# Patient Record
Sex: Male | Born: 1945 | Race: Black or African American | Hispanic: No | Marital: Single | Smoking: Former smoker
Health system: Southern US, Community
[De-identification: ages and names within clinical notes are randomized; demographics above are authoritative.]

## PROBLEM LIST (undated history)

## (undated) DIAGNOSIS — J189 Pneumonia, unspecified organism: Secondary | ICD-10-CM

## (undated) DIAGNOSIS — A044 Other intestinal Escherichia coli infections: Secondary | ICD-10-CM

## (undated) DIAGNOSIS — K5900 Constipation, unspecified: Secondary | ICD-10-CM

## (undated) DIAGNOSIS — R569 Unspecified convulsions: Secondary | ICD-10-CM

## (undated) DIAGNOSIS — I10 Essential (primary) hypertension: Secondary | ICD-10-CM

## (undated) DIAGNOSIS — L98499 Non-pressure chronic ulcer of skin of other sites with unspecified severity: Secondary | ICD-10-CM

## (undated) DIAGNOSIS — N390 Urinary tract infection, site not specified: Secondary | ICD-10-CM

## (undated) DIAGNOSIS — F3289 Other specified depressive episodes: Secondary | ICD-10-CM

## (undated) DIAGNOSIS — I6789 Other cerebrovascular disease: Secondary | ICD-10-CM

## (undated) DIAGNOSIS — Z79899 Other long term (current) drug therapy: Secondary | ICD-10-CM

## (undated) DIAGNOSIS — N51 Disorders of male genital organs in diseases classified elsewhere: Secondary | ICD-10-CM

## (undated) DIAGNOSIS — F29 Unspecified psychosis not due to a substance or known physiological condition: Secondary | ICD-10-CM

## (undated) DIAGNOSIS — N400 Enlarged prostate without lower urinary tract symptoms: Secondary | ICD-10-CM

## (undated) DIAGNOSIS — J069 Acute upper respiratory infection, unspecified: Secondary | ICD-10-CM

## (undated) DIAGNOSIS — J811 Chronic pulmonary edema: Secondary | ICD-10-CM

## (undated) DIAGNOSIS — M869 Osteomyelitis, unspecified: Secondary | ICD-10-CM

## (undated) DIAGNOSIS — K56609 Unspecified intestinal obstruction, unspecified as to partial versus complete obstruction: Secondary | ICD-10-CM

## (undated) DIAGNOSIS — D696 Thrombocytopenia, unspecified: Secondary | ICD-10-CM

## (undated) DIAGNOSIS — G8929 Other chronic pain: Secondary | ICD-10-CM

## (undated) DIAGNOSIS — R188 Other ascites: Secondary | ICD-10-CM

## (undated) DIAGNOSIS — N4889 Other specified disorders of penis: Secondary | ICD-10-CM

## (undated) DIAGNOSIS — F329 Major depressive disorder, single episode, unspecified: Secondary | ICD-10-CM

## (undated) DIAGNOSIS — N179 Acute kidney failure, unspecified: Secondary | ICD-10-CM

## (undated) DIAGNOSIS — S065X9A Traumatic subdural hemorrhage with loss of consciousness of unspecified duration, initial encounter: Secondary | ICD-10-CM

## (undated) HISTORY — PX: BELOW KNEE LEG AMPUTATION: SUR23

## (undated) HISTORY — DX: Pneumonia, unspecified organism: J18.9

## (undated) HISTORY — DX: Traumatic subdural hemorrhage with loss of consciousness of unspecified duration, initial encounter: S06.5X9A

---

## 1988-07-02 HISTORY — PX: OTHER SURGICAL HISTORY: SHX169

## 1997-10-07 ENCOUNTER — Ambulatory Visit (HOSPITAL_COMMUNITY): Admission: RE | Admit: 1997-10-07 | Discharge: 1997-10-07 | Payer: Self-pay | Admitting: Neurological Surgery

## 1997-11-03 ENCOUNTER — Encounter: Admission: RE | Admit: 1997-11-03 | Discharge: 1998-02-01 | Payer: Self-pay | Admitting: Neurological Surgery

## 1999-04-18 ENCOUNTER — Emergency Department (HOSPITAL_COMMUNITY): Admission: EM | Admit: 1999-04-18 | Discharge: 1999-04-18 | Payer: Self-pay | Admitting: Emergency Medicine

## 1999-04-18 ENCOUNTER — Encounter: Payer: Self-pay | Admitting: Emergency Medicine

## 1999-06-14 ENCOUNTER — Encounter: Payer: Self-pay | Admitting: Emergency Medicine

## 1999-06-14 ENCOUNTER — Inpatient Hospital Stay (HOSPITAL_COMMUNITY): Admission: EM | Admit: 1999-06-14 | Discharge: 1999-06-15 | Payer: Self-pay | Admitting: Emergency Medicine

## 1999-07-05 ENCOUNTER — Encounter: Payer: Self-pay | Admitting: Family Medicine

## 1999-07-05 ENCOUNTER — Ambulatory Visit (HOSPITAL_COMMUNITY): Admission: RE | Admit: 1999-07-05 | Discharge: 1999-07-05 | Payer: Self-pay | Admitting: Family Medicine

## 2000-05-14 ENCOUNTER — Emergency Department (HOSPITAL_COMMUNITY): Admission: EM | Admit: 2000-05-14 | Discharge: 2000-05-15 | Payer: Self-pay | Admitting: Emergency Medicine

## 2000-07-12 ENCOUNTER — Encounter: Admission: RE | Admit: 2000-07-12 | Discharge: 2000-07-12 | Payer: Self-pay | Admitting: Family Medicine

## 2000-07-19 ENCOUNTER — Encounter: Admission: RE | Admit: 2000-07-19 | Discharge: 2000-07-19 | Payer: Self-pay | Admitting: Family Medicine

## 2000-07-22 ENCOUNTER — Encounter: Payer: Self-pay | Admitting: Emergency Medicine

## 2000-07-22 ENCOUNTER — Emergency Department (HOSPITAL_COMMUNITY): Admission: EM | Admit: 2000-07-22 | Discharge: 2000-07-22 | Payer: Self-pay | Admitting: Emergency Medicine

## 2000-08-05 ENCOUNTER — Encounter: Admission: RE | Admit: 2000-08-05 | Discharge: 2000-08-05 | Payer: Self-pay | Admitting: Family Medicine

## 2000-08-15 ENCOUNTER — Emergency Department (HOSPITAL_COMMUNITY): Admission: EM | Admit: 2000-08-15 | Discharge: 2000-08-15 | Payer: Self-pay | Admitting: *Deleted

## 2000-08-29 ENCOUNTER — Encounter: Admission: RE | Admit: 2000-08-29 | Discharge: 2000-08-29 | Payer: Self-pay | Admitting: Family Medicine

## 2000-09-11 ENCOUNTER — Emergency Department (HOSPITAL_COMMUNITY): Admission: EM | Admit: 2000-09-11 | Discharge: 2000-09-11 | Payer: Self-pay | Admitting: Emergency Medicine

## 2000-09-25 ENCOUNTER — Encounter: Admission: RE | Admit: 2000-09-25 | Discharge: 2000-09-25 | Payer: Self-pay | Admitting: Family Medicine

## 2000-10-14 ENCOUNTER — Encounter (INDEPENDENT_AMBULATORY_CARE_PROVIDER_SITE_OTHER): Payer: Self-pay

## 2000-10-14 ENCOUNTER — Ambulatory Visit (HOSPITAL_COMMUNITY): Admission: RE | Admit: 2000-10-14 | Discharge: 2000-10-14 | Payer: Self-pay | Admitting: Gastroenterology

## 2000-10-29 ENCOUNTER — Emergency Department (HOSPITAL_COMMUNITY): Admission: EM | Admit: 2000-10-29 | Discharge: 2000-10-29 | Payer: Self-pay | Admitting: Emergency Medicine

## 2000-11-05 ENCOUNTER — Encounter: Admission: RE | Admit: 2000-11-05 | Discharge: 2000-11-05 | Payer: Self-pay | Admitting: Family Medicine

## 2000-12-17 ENCOUNTER — Emergency Department (HOSPITAL_COMMUNITY): Admission: EM | Admit: 2000-12-17 | Discharge: 2000-12-18 | Payer: Self-pay | Admitting: Emergency Medicine

## 2001-01-29 ENCOUNTER — Encounter: Admission: RE | Admit: 2001-01-29 | Discharge: 2001-01-29 | Payer: Self-pay | Admitting: Family Medicine

## 2001-02-13 ENCOUNTER — Encounter: Admission: RE | Admit: 2001-02-13 | Discharge: 2001-02-13 | Payer: Self-pay | Admitting: Family Medicine

## 2001-02-27 ENCOUNTER — Encounter: Admission: RE | Admit: 2001-02-27 | Discharge: 2001-02-27 | Payer: Self-pay | Admitting: Family Medicine

## 2001-03-26 ENCOUNTER — Encounter: Admission: RE | Admit: 2001-03-26 | Discharge: 2001-03-26 | Payer: Self-pay | Admitting: Family Medicine

## 2001-05-27 ENCOUNTER — Encounter: Admission: RE | Admit: 2001-05-27 | Discharge: 2001-05-27 | Payer: Self-pay | Admitting: Family Medicine

## 2001-06-11 ENCOUNTER — Encounter: Admission: RE | Admit: 2001-06-11 | Discharge: 2001-06-11 | Payer: Self-pay | Admitting: *Deleted

## 2001-06-26 ENCOUNTER — Encounter: Admission: RE | Admit: 2001-06-26 | Discharge: 2001-06-26 | Payer: Self-pay | Admitting: Family Medicine

## 2001-08-01 ENCOUNTER — Encounter: Admission: RE | Admit: 2001-08-01 | Discharge: 2001-08-01 | Payer: Self-pay | Admitting: Family Medicine

## 2001-09-01 ENCOUNTER — Encounter: Admission: RE | Admit: 2001-09-01 | Discharge: 2001-09-01 | Payer: Self-pay | Admitting: Family Medicine

## 2001-09-11 ENCOUNTER — Encounter: Admission: RE | Admit: 2001-09-11 | Discharge: 2001-09-11 | Payer: Self-pay | Admitting: Family Medicine

## 2001-10-23 ENCOUNTER — Encounter: Admission: RE | Admit: 2001-10-23 | Discharge: 2001-10-23 | Payer: Self-pay | Admitting: Family Medicine

## 2001-12-03 ENCOUNTER — Encounter: Admission: RE | Admit: 2001-12-03 | Discharge: 2001-12-03 | Payer: Self-pay | Admitting: Family Medicine

## 2002-01-15 ENCOUNTER — Encounter: Admission: RE | Admit: 2002-01-15 | Discharge: 2002-01-15 | Payer: Self-pay | Admitting: Family Medicine

## 2002-02-13 ENCOUNTER — Encounter: Admission: RE | Admit: 2002-02-13 | Discharge: 2002-03-26 | Payer: Self-pay | Admitting: Sports Medicine

## 2002-02-16 ENCOUNTER — Encounter: Admission: RE | Admit: 2002-02-16 | Discharge: 2002-02-16 | Payer: Self-pay | Admitting: Family Medicine

## 2002-02-24 ENCOUNTER — Encounter: Admission: RE | Admit: 2002-02-24 | Discharge: 2002-02-24 | Payer: Self-pay | Admitting: Sports Medicine

## 2002-04-23 ENCOUNTER — Encounter: Admission: RE | Admit: 2002-04-23 | Discharge: 2002-04-23 | Payer: Self-pay | Admitting: Family Medicine

## 2002-05-27 ENCOUNTER — Encounter: Admission: RE | Admit: 2002-05-27 | Discharge: 2002-05-27 | Payer: Self-pay | Admitting: Family Medicine

## 2002-07-09 ENCOUNTER — Encounter: Admission: RE | Admit: 2002-07-09 | Discharge: 2002-07-09 | Payer: Self-pay | Admitting: Family Medicine

## 2002-08-25 ENCOUNTER — Encounter: Admission: RE | Admit: 2002-08-25 | Discharge: 2002-08-25 | Payer: Self-pay | Admitting: Family Medicine

## 2002-10-15 ENCOUNTER — Encounter: Admission: RE | Admit: 2002-10-15 | Discharge: 2002-10-15 | Payer: Self-pay | Admitting: Family Medicine

## 2002-11-16 ENCOUNTER — Encounter: Admission: RE | Admit: 2002-11-16 | Discharge: 2002-11-16 | Payer: Self-pay | Admitting: Family Medicine

## 2002-12-04 ENCOUNTER — Emergency Department (HOSPITAL_COMMUNITY): Admission: EM | Admit: 2002-12-04 | Discharge: 2002-12-04 | Payer: Self-pay | Admitting: Emergency Medicine

## 2002-12-22 ENCOUNTER — Encounter: Payer: Self-pay | Admitting: Emergency Medicine

## 2002-12-22 ENCOUNTER — Emergency Department (HOSPITAL_COMMUNITY): Admission: EM | Admit: 2002-12-22 | Discharge: 2002-12-22 | Payer: Self-pay | Admitting: Emergency Medicine

## 2002-12-23 ENCOUNTER — Encounter: Admission: RE | Admit: 2002-12-23 | Discharge: 2002-12-23 | Payer: Self-pay | Admitting: Family Medicine

## 2003-02-02 ENCOUNTER — Encounter: Admission: RE | Admit: 2003-02-02 | Discharge: 2003-02-02 | Payer: Self-pay | Admitting: Family Medicine

## 2003-03-31 ENCOUNTER — Encounter: Admission: RE | Admit: 2003-03-31 | Discharge: 2003-03-31 | Payer: Self-pay | Admitting: Family Medicine

## 2003-06-16 ENCOUNTER — Encounter: Admission: RE | Admit: 2003-06-16 | Discharge: 2003-06-16 | Payer: Self-pay | Admitting: Family Medicine

## 2003-07-13 ENCOUNTER — Encounter: Admission: RE | Admit: 2003-07-13 | Discharge: 2003-07-13 | Payer: Self-pay | Admitting: Sports Medicine

## 2003-09-13 ENCOUNTER — Encounter: Admission: RE | Admit: 2003-09-13 | Discharge: 2003-09-13 | Payer: Self-pay | Admitting: Family Medicine

## 2003-09-30 ENCOUNTER — Encounter: Admission: RE | Admit: 2003-09-30 | Discharge: 2003-09-30 | Payer: Self-pay | Admitting: Sports Medicine

## 2003-10-14 ENCOUNTER — Encounter: Admission: RE | Admit: 2003-10-14 | Discharge: 2003-10-14 | Payer: Self-pay | Admitting: Family Medicine

## 2003-12-02 ENCOUNTER — Encounter: Admission: RE | Admit: 2003-12-02 | Discharge: 2003-12-02 | Payer: Self-pay | Admitting: Family Medicine

## 2004-02-07 ENCOUNTER — Encounter: Admission: RE | Admit: 2004-02-07 | Discharge: 2004-02-07 | Payer: Self-pay | Admitting: Family Medicine

## 2004-03-10 ENCOUNTER — Emergency Department (HOSPITAL_COMMUNITY): Admission: EM | Admit: 2004-03-10 | Discharge: 2004-03-10 | Payer: Self-pay | Admitting: Emergency Medicine

## 2004-03-17 ENCOUNTER — Ambulatory Visit: Payer: Self-pay | Admitting: Family Medicine

## 2004-03-17 ENCOUNTER — Ambulatory Visit (HOSPITAL_COMMUNITY): Admission: RE | Admit: 2004-03-17 | Discharge: 2004-03-17 | Payer: Self-pay | Admitting: Family Medicine

## 2004-03-20 ENCOUNTER — Ambulatory Visit: Payer: Self-pay | Admitting: Family Medicine

## 2004-03-27 ENCOUNTER — Ambulatory Visit: Payer: Self-pay | Admitting: Family Medicine

## 2004-05-15 ENCOUNTER — Ambulatory Visit: Payer: Self-pay | Admitting: Family Medicine

## 2004-06-16 ENCOUNTER — Ambulatory Visit: Payer: Self-pay | Admitting: Sports Medicine

## 2004-06-19 ENCOUNTER — Encounter: Admission: RE | Admit: 2004-06-19 | Discharge: 2004-06-19 | Payer: Self-pay | Admitting: Sports Medicine

## 2004-06-22 ENCOUNTER — Ambulatory Visit: Payer: Self-pay | Admitting: Family Medicine

## 2004-07-21 ENCOUNTER — Ambulatory Visit: Payer: Self-pay | Admitting: Family Medicine

## 2004-08-11 ENCOUNTER — Emergency Department (HOSPITAL_COMMUNITY): Admission: EM | Admit: 2004-08-11 | Discharge: 2004-08-11 | Payer: Self-pay | Admitting: Emergency Medicine

## 2004-10-06 ENCOUNTER — Emergency Department (HOSPITAL_COMMUNITY): Admission: EM | Admit: 2004-10-06 | Discharge: 2004-10-06 | Payer: Self-pay | Admitting: Emergency Medicine

## 2004-10-06 ENCOUNTER — Ambulatory Visit: Payer: Self-pay | Admitting: Family Medicine

## 2005-01-04 ENCOUNTER — Ambulatory Visit: Payer: Self-pay | Admitting: Family Medicine

## 2005-01-16 ENCOUNTER — Encounter: Admission: RE | Admit: 2005-01-16 | Discharge: 2005-01-16 | Payer: Self-pay | Admitting: Sports Medicine

## 2005-01-16 ENCOUNTER — Ambulatory Visit: Payer: Self-pay | Admitting: Family Medicine

## 2005-02-06 ENCOUNTER — Ambulatory Visit: Payer: Self-pay | Admitting: Family Medicine

## 2005-03-13 ENCOUNTER — Ambulatory Visit: Payer: Self-pay | Admitting: Family Medicine

## 2005-03-16 ENCOUNTER — Ambulatory Visit (HOSPITAL_COMMUNITY): Admission: RE | Admit: 2005-03-16 | Discharge: 2005-03-16 | Payer: Self-pay | Admitting: Sports Medicine

## 2005-03-29 ENCOUNTER — Ambulatory Visit: Payer: Self-pay | Admitting: Family Medicine

## 2005-03-29 ENCOUNTER — Inpatient Hospital Stay (HOSPITAL_COMMUNITY): Admission: AD | Admit: 2005-03-29 | Discharge: 2005-04-13 | Payer: Self-pay | Admitting: Family Medicine

## 2005-03-29 ENCOUNTER — Ambulatory Visit: Payer: Self-pay | Admitting: Physical Medicine & Rehabilitation

## 2005-04-04 ENCOUNTER — Encounter (INDEPENDENT_AMBULATORY_CARE_PROVIDER_SITE_OTHER): Payer: Self-pay | Admitting: *Deleted

## 2005-05-07 ENCOUNTER — Ambulatory Visit: Payer: Self-pay | Admitting: Sports Medicine

## 2005-05-30 ENCOUNTER — Emergency Department (HOSPITAL_COMMUNITY): Admission: EM | Admit: 2005-05-30 | Discharge: 2005-05-30 | Payer: Self-pay | Admitting: Emergency Medicine

## 2005-06-06 ENCOUNTER — Ambulatory Visit: Payer: Self-pay | Admitting: Family Medicine

## 2005-06-06 ENCOUNTER — Ambulatory Visit: Payer: Self-pay | Admitting: Physical Medicine & Rehabilitation

## 2005-06-06 ENCOUNTER — Inpatient Hospital Stay (HOSPITAL_COMMUNITY): Admission: AD | Admit: 2005-06-06 | Discharge: 2005-06-18 | Payer: Self-pay | Admitting: Family Medicine

## 2005-06-13 ENCOUNTER — Encounter (INDEPENDENT_AMBULATORY_CARE_PROVIDER_SITE_OTHER): Payer: Self-pay | Admitting: *Deleted

## 2005-07-02 HISTORY — PX: CIRCUMCISION: SUR203

## 2005-07-02 HISTORY — PX: SUPRAPUBIC CATHETER PLACEMENT: SHX2473

## 2005-07-05 ENCOUNTER — Inpatient Hospital Stay (HOSPITAL_COMMUNITY): Admission: EM | Admit: 2005-07-05 | Discharge: 2005-07-12 | Payer: Self-pay | Admitting: Emergency Medicine

## 2005-07-05 ENCOUNTER — Ambulatory Visit: Payer: Self-pay | Admitting: Family Medicine

## 2005-07-10 ENCOUNTER — Ambulatory Visit: Payer: Self-pay | Admitting: Family Medicine

## 2005-07-16 ENCOUNTER — Ambulatory Visit: Payer: Self-pay | Admitting: Family Medicine

## 2005-07-23 ENCOUNTER — Emergency Department (HOSPITAL_COMMUNITY): Admission: EM | Admit: 2005-07-23 | Discharge: 2005-07-24 | Payer: Self-pay | Admitting: Emergency Medicine

## 2005-07-24 ENCOUNTER — Inpatient Hospital Stay (HOSPITAL_COMMUNITY): Admission: EM | Admit: 2005-07-24 | Discharge: 2005-07-31 | Payer: Self-pay | Admitting: Internal Medicine

## 2005-07-25 ENCOUNTER — Ambulatory Visit: Payer: Self-pay | Admitting: Family Medicine

## 2005-08-13 ENCOUNTER — Emergency Department (HOSPITAL_COMMUNITY): Admission: EM | Admit: 2005-08-13 | Discharge: 2005-08-13 | Payer: Self-pay | Admitting: Internal Medicine

## 2006-01-11 ENCOUNTER — Ambulatory Visit: Payer: Self-pay | Admitting: Internal Medicine

## 2006-01-11 ENCOUNTER — Inpatient Hospital Stay (HOSPITAL_COMMUNITY): Admission: EM | Admit: 2006-01-11 | Discharge: 2006-01-15 | Payer: Self-pay | Admitting: Emergency Medicine

## 2006-01-24 ENCOUNTER — Ambulatory Visit (HOSPITAL_COMMUNITY): Admission: RE | Admit: 2006-01-24 | Discharge: 2006-01-24 | Payer: Self-pay | Admitting: Urology

## 2006-01-29 ENCOUNTER — Ambulatory Visit: Payer: Self-pay | Admitting: Family Medicine

## 2006-02-01 ENCOUNTER — Encounter (HOSPITAL_BASED_OUTPATIENT_CLINIC_OR_DEPARTMENT_OTHER): Admission: RE | Admit: 2006-02-01 | Discharge: 2006-05-02 | Payer: Self-pay | Admitting: Surgery

## 2006-06-17 ENCOUNTER — Encounter (HOSPITAL_BASED_OUTPATIENT_CLINIC_OR_DEPARTMENT_OTHER): Admission: RE | Admit: 2006-06-17 | Discharge: 2006-06-20 | Payer: Self-pay | Admitting: Surgery

## 2006-08-29 DIAGNOSIS — I10 Essential (primary) hypertension: Secondary | ICD-10-CM

## 2006-08-29 DIAGNOSIS — M959 Acquired deformity of musculoskeletal system, unspecified: Secondary | ICD-10-CM | POA: Insufficient documentation

## 2006-08-29 DIAGNOSIS — M869 Osteomyelitis, unspecified: Secondary | ICD-10-CM | POA: Insufficient documentation

## 2006-08-29 DIAGNOSIS — R209 Unspecified disturbances of skin sensation: Secondary | ICD-10-CM

## 2006-08-29 DIAGNOSIS — L98499 Non-pressure chronic ulcer of skin of other sites with unspecified severity: Secondary | ICD-10-CM | POA: Insufficient documentation

## 2006-08-29 DIAGNOSIS — N400 Enlarged prostate without lower urinary tract symptoms: Secondary | ICD-10-CM

## 2006-08-29 DIAGNOSIS — F17200 Nicotine dependence, unspecified, uncomplicated: Secondary | ICD-10-CM

## 2006-08-29 DIAGNOSIS — R569 Unspecified convulsions: Secondary | ICD-10-CM

## 2006-08-29 DIAGNOSIS — M25559 Pain in unspecified hip: Secondary | ICD-10-CM

## 2006-08-29 DIAGNOSIS — F528 Other sexual dysfunction not due to a substance or known physiological condition: Secondary | ICD-10-CM | POA: Insufficient documentation

## 2006-08-29 DIAGNOSIS — K649 Unspecified hemorrhoids: Secondary | ICD-10-CM | POA: Insufficient documentation

## 2006-08-29 DIAGNOSIS — E7800 Pure hypercholesterolemia, unspecified: Secondary | ICD-10-CM

## 2006-09-30 IMAGING — CR DG CHEST 1V PORT
2 series · 2 of 2 positions shown · non-contrast
Comparison: 07/06/05

CLINICAL DATA: Fever, hypotension, smoker.
 PORTABLE CHEST ? 1 VIEW ? 7774 hours:

[view not recorded (1 of 2)]
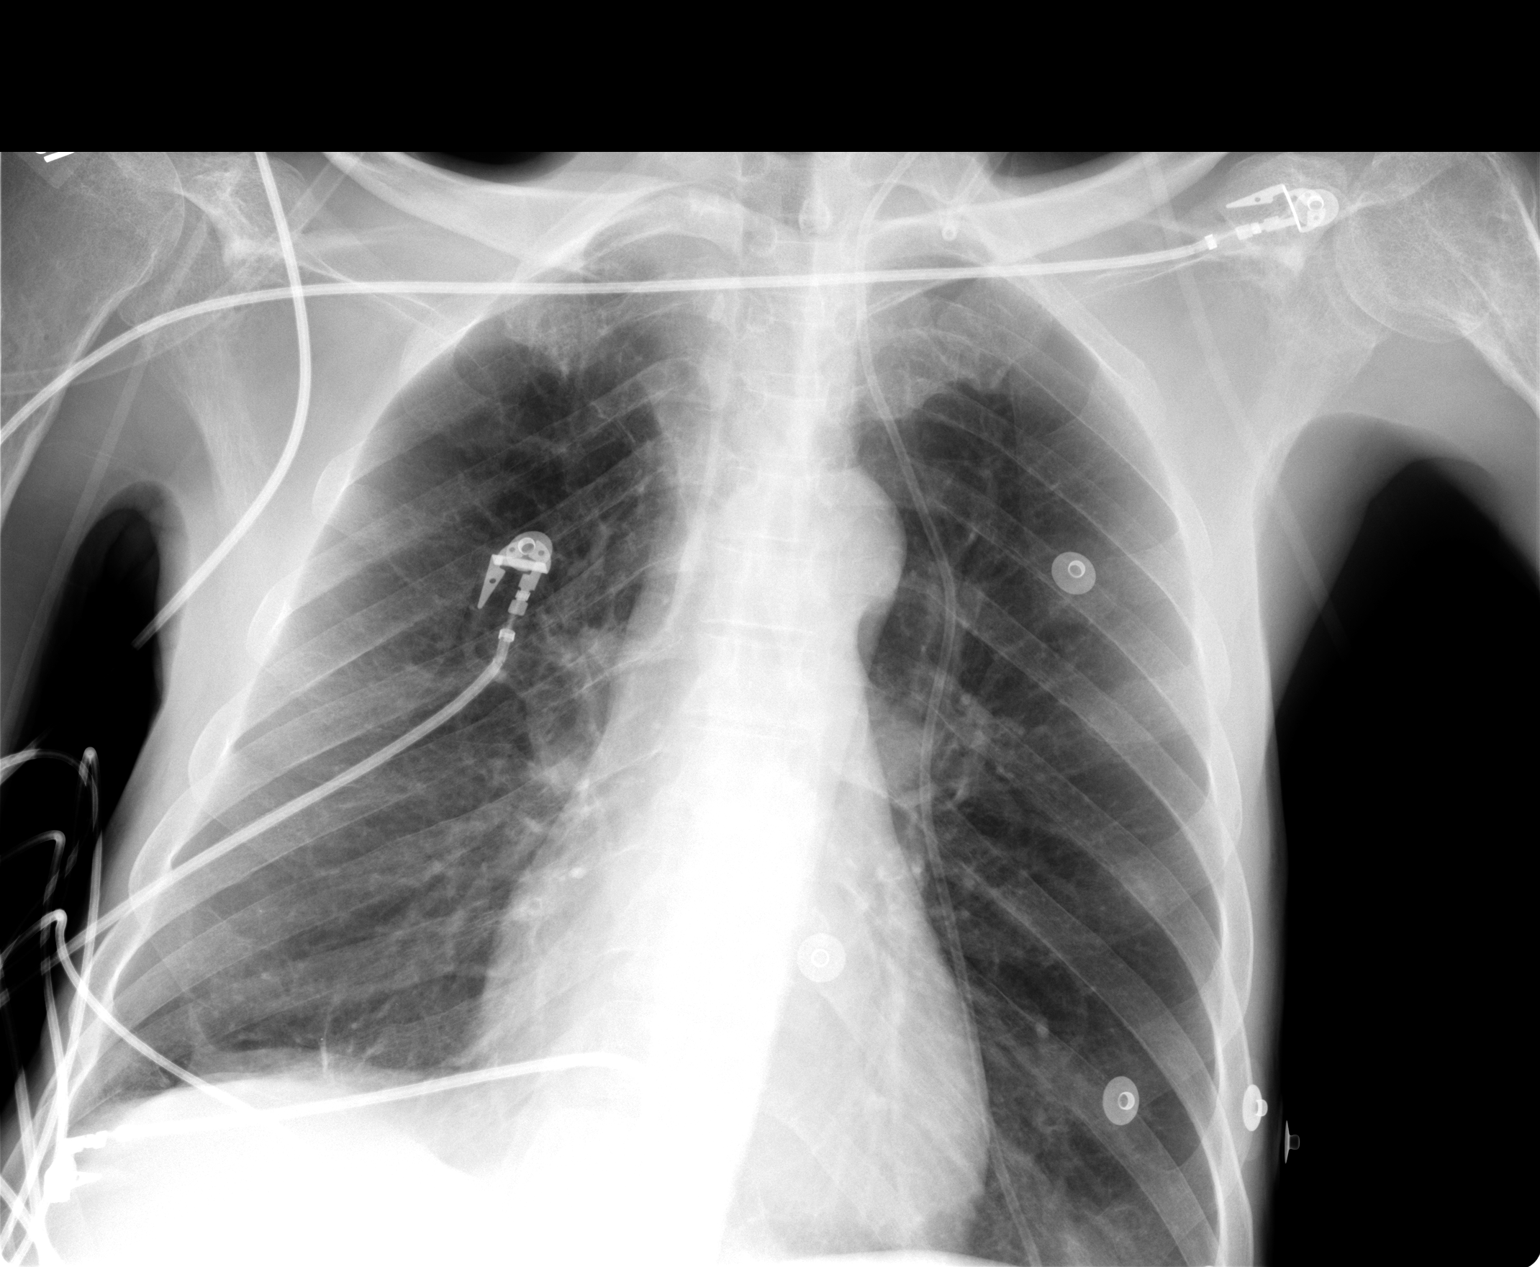

[view not recorded (2 of 2)]
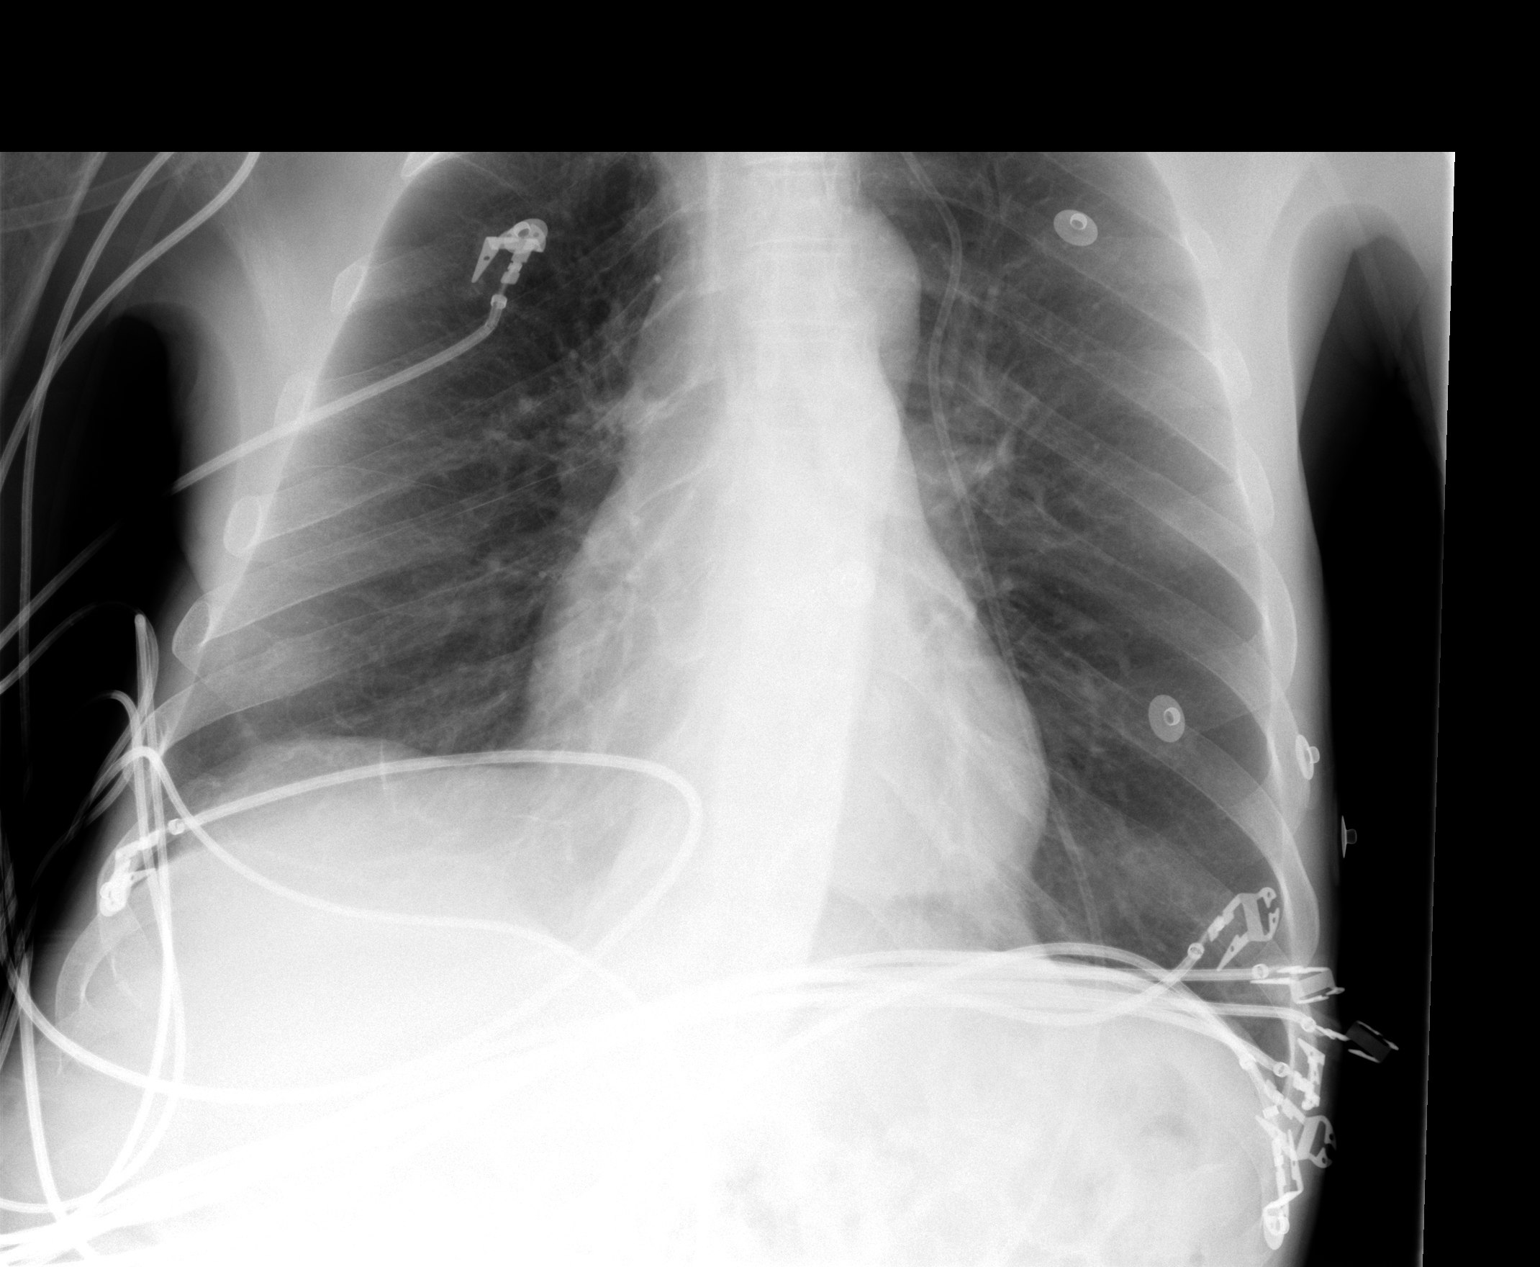

[2 of 2 positions shown; findings below may reference images not displayed]

FINDINGS: COPD.  No CHF.  No edema or infiltrate.  Ventriculoperitoneal shunt tubing projects over the left neck soft tissues and chest and upper abdomen.
IMPRESSION: No acute chest findings.  COPD.

## 2006-09-30 IMAGING — CT CT ABDOMEN W/ CM
2 of 5 series · 17 of 46 positions shown, 19 images · IV contrast (ORAL OMNI 350 & 100 ML OMNI 300)
Comparison: 07/23/05.

CLINICAL DATA: 60-year-old male, septic shock, fever and hypotension.  
ABDOMEN CT WITH CONTRAST:
TECHNIQUE: Multidetector CT imaging of the abdomen was performed following the standard protocol during bolus administration of intravenous contrast.
Contrast:  100 cc Omnipaque 300.
TECHNIQUE: Multidetector CT imaging of the pelvis was performed following the standard protocol during bolus administration of intravenous contrast.

[Series 2: routine abdomen · axial · 0.75mm/px · z∈[-488,-63]mm · 14 of 96 slices shown, 16 images]
[im 6/96  soft-tissue]
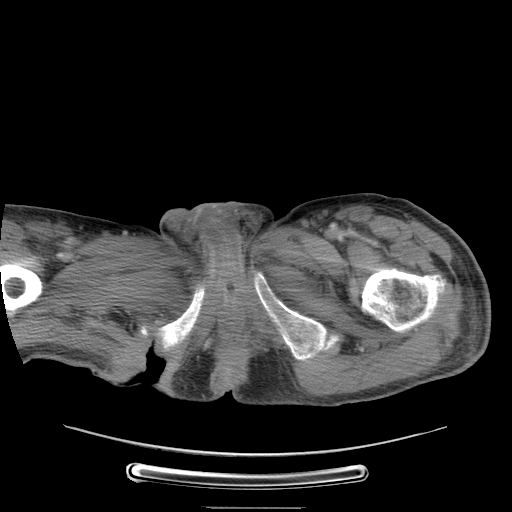
[im 6/96  bone]
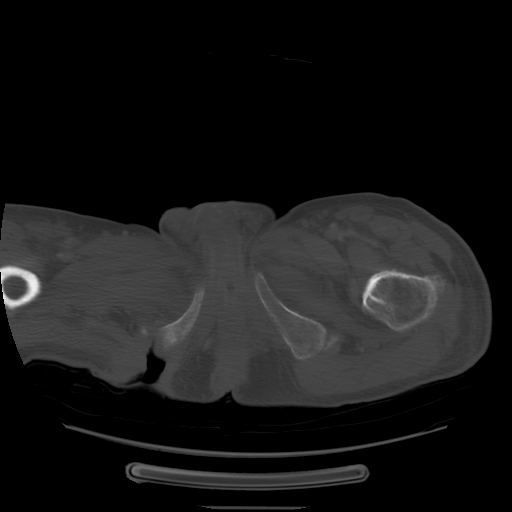
[im 11/96  soft-tissue]
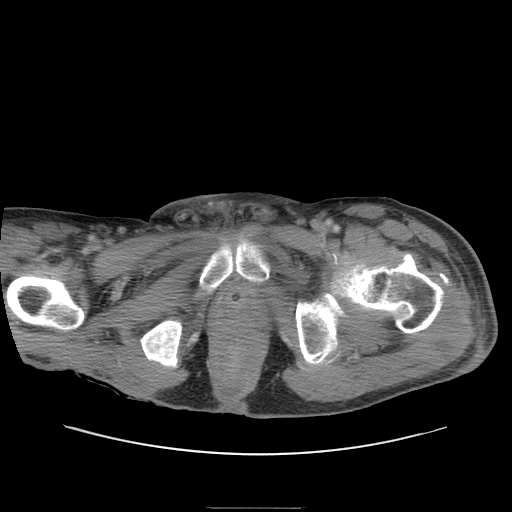
[im 21/96  soft-tissue]
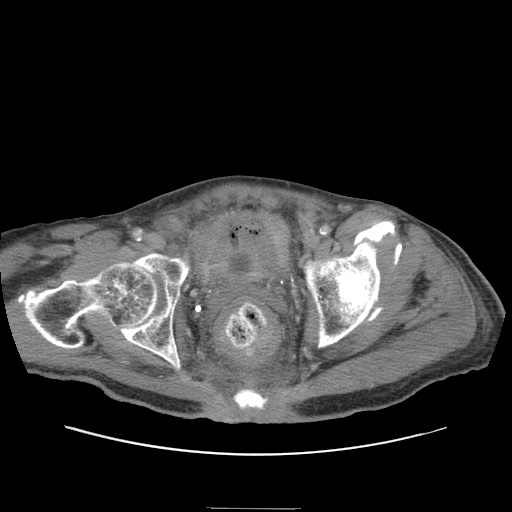
[im 26/96  soft-tissue]
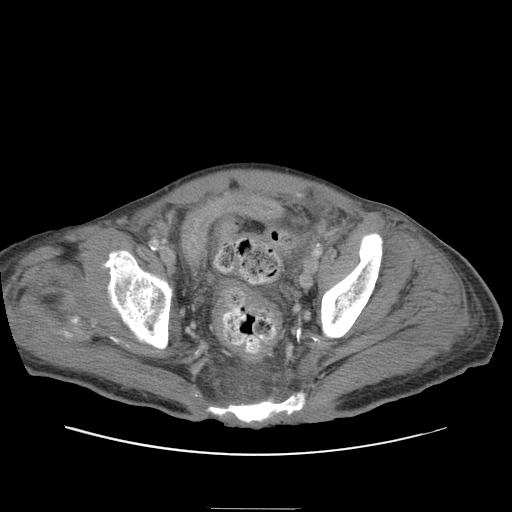
[im 31/96  soft-tissue]
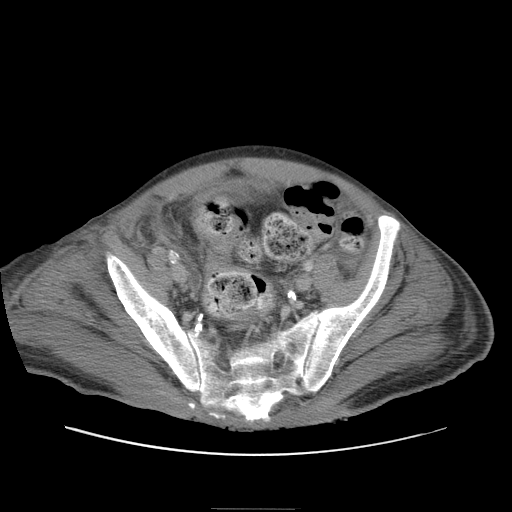
[im 41/96  soft-tissue]
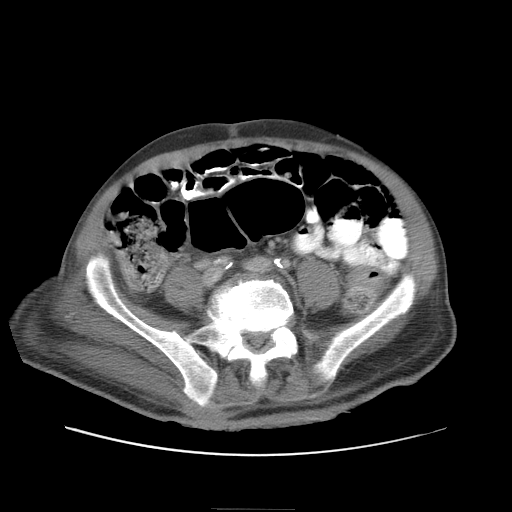
[im 46/96  soft-tissue]
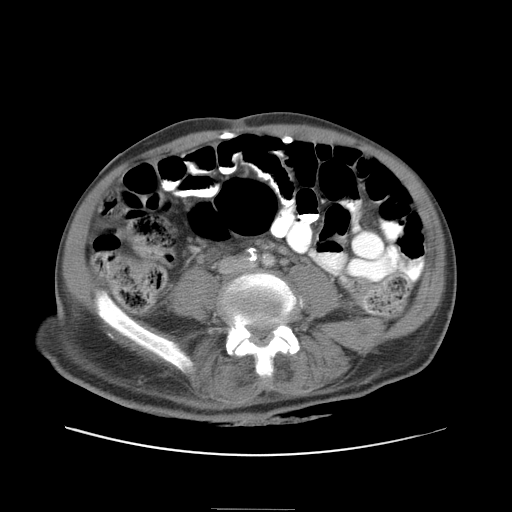
[im 51/96  soft-tissue]
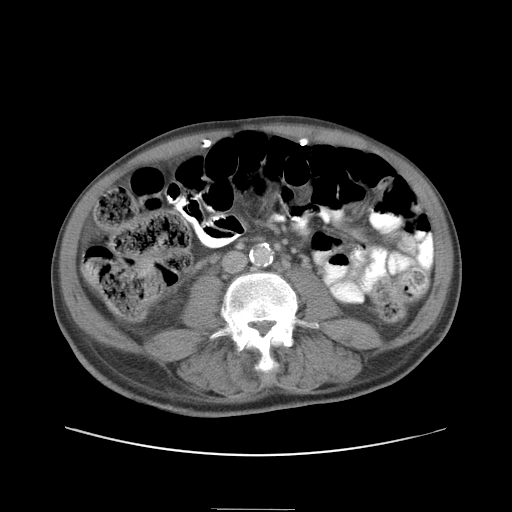
[im 56/96  soft-tissue]
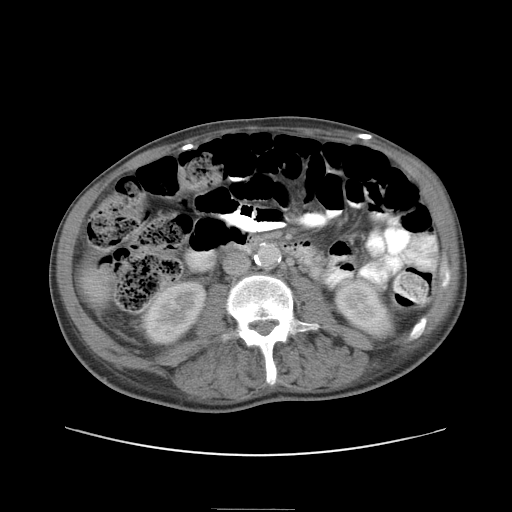
[im 56/96  bone]
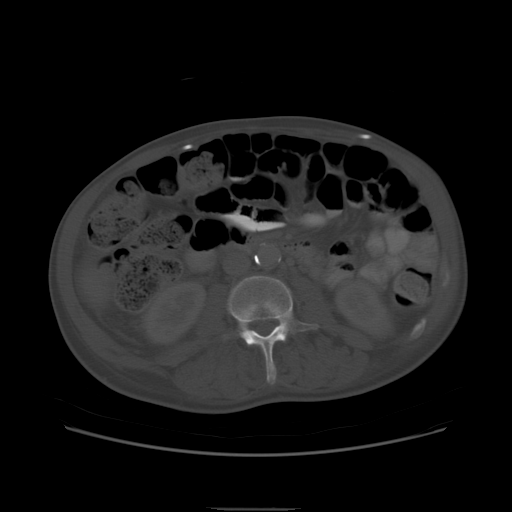
[im 66/96  soft-tissue]
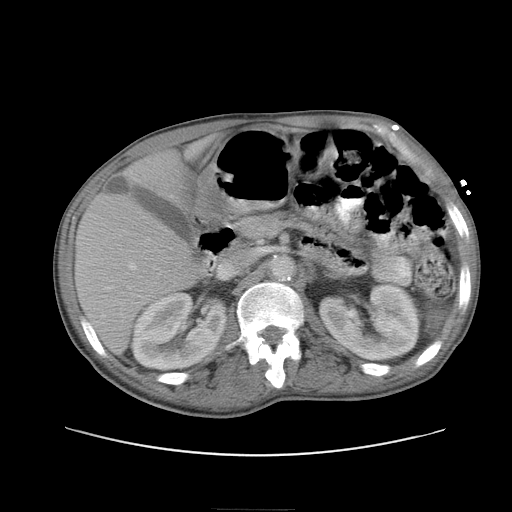
[im 71/96  soft-tissue]
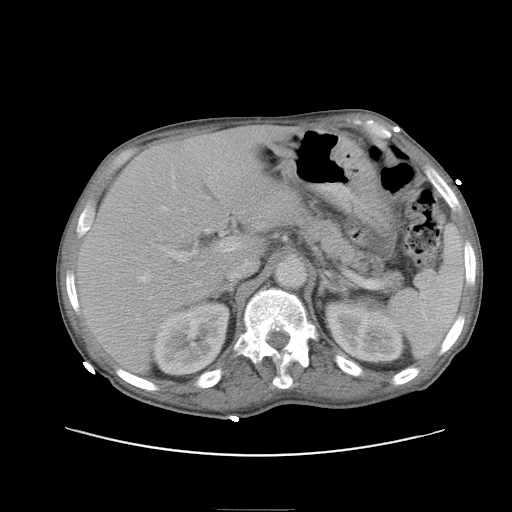
[im 76/96  soft-tissue]
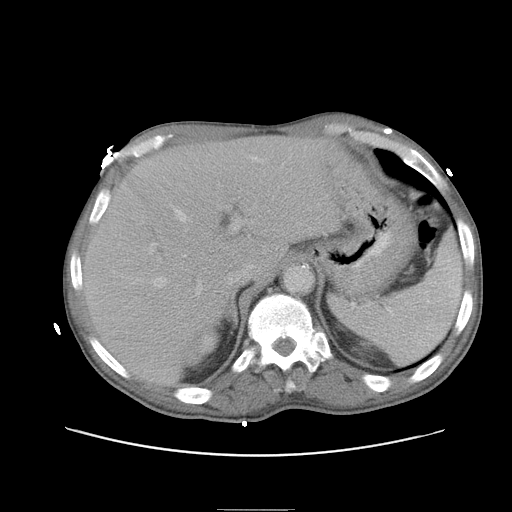
[im 86/96  soft-tissue]
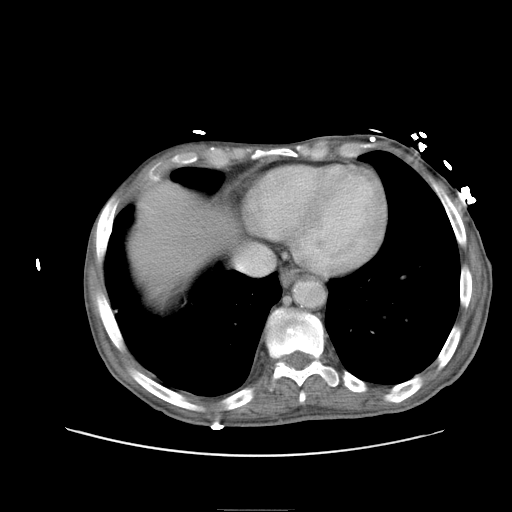
[im 91/96  soft-tissue]
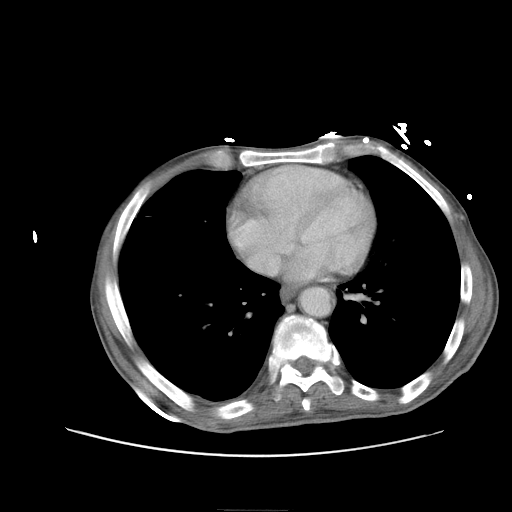

[Series 401: reformatted · coronal · 0.94mm/px · 3 of 116 slices shown]
[im 39/116  soft-tissue]
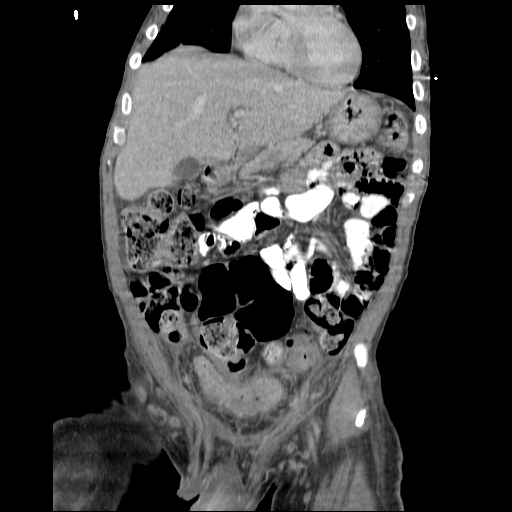
[im 52/116  soft-tissue]
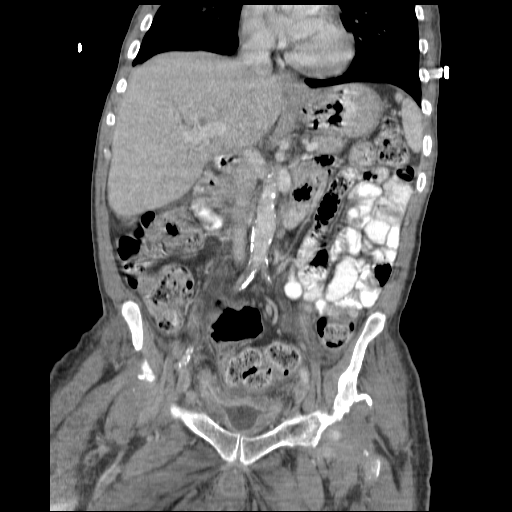
[im 64/116  soft-tissue]
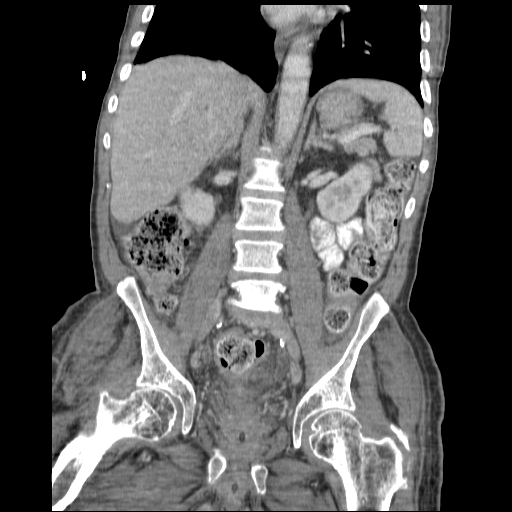

[17 of 46 positions shown; findings below may reference images not displayed]

FINDINGS: There is linear atelectasis or scarring in both lung bases.  The previously seen pleural effusions have resolved.  Heart size is within normal limits.  
Infused appearance of the liver is normal.  Spleen and pancreas are within normal limits.  The common bile duct and gallbladder are unremarkable.  Adrenal glands and kidneys are normal bilaterally.  Atherosclerotic calcifications are noted without aneurysm.  Scattered subcentimeter paraaortic lymph nodes are present.  There is minimal fluid on the right paracolic gutter below the tip of the liver.
IMPRESSION: 1. Minimal free fluid in the abdomen.  
2. Atherosclerosis.
3. No other focal pathology of the abdomen.
PELVIS CT WITH CONTRAST:
FINDINGS: A Foley catheter is in place.  There is diffuse thickening of the rectum extending to the sigmoid junction.  
No focal bone lesions are seen.  There are marked degenerative changes of the hip joints bilaterally.
IMPRESSION: 1. Foley catheter in situ.
2. Marked inflammatory changes about the rectum, wall thickening and stranding, but no focal abscess.  
3. Atherosclerosis.  
5. Degenerative changes of the hips bilaterally.

## 2006-11-03 ENCOUNTER — Ambulatory Visit: Payer: Self-pay | Admitting: Emergency Medicine

## 2006-11-03 ENCOUNTER — Inpatient Hospital Stay (HOSPITAL_COMMUNITY): Admission: EM | Admit: 2006-11-03 | Discharge: 2006-11-12 | Payer: Self-pay | Admitting: Emergency Medicine

## 2006-11-04 ENCOUNTER — Encounter (INDEPENDENT_AMBULATORY_CARE_PROVIDER_SITE_OTHER): Payer: Self-pay | Admitting: Cardiology

## 2007-11-23 ENCOUNTER — Emergency Department (HOSPITAL_COMMUNITY): Admission: EM | Admit: 2007-11-23 | Discharge: 2007-11-23 | Payer: Self-pay | Admitting: Emergency Medicine

## 2008-06-04 ENCOUNTER — Ambulatory Visit: Payer: Self-pay | Admitting: Internal Medicine

## 2008-06-04 ENCOUNTER — Inpatient Hospital Stay (HOSPITAL_COMMUNITY): Admission: EM | Admit: 2008-06-04 | Discharge: 2008-06-28 | Payer: Self-pay | Admitting: Emergency Medicine

## 2008-06-08 ENCOUNTER — Encounter (INDEPENDENT_AMBULATORY_CARE_PROVIDER_SITE_OTHER): Payer: Self-pay | Admitting: Internal Medicine

## 2008-06-10 ENCOUNTER — Encounter: Payer: Self-pay | Admitting: Urology

## 2008-06-22 ENCOUNTER — Encounter: Payer: Self-pay | Admitting: Urology

## 2008-12-16 ENCOUNTER — Encounter (HOSPITAL_BASED_OUTPATIENT_CLINIC_OR_DEPARTMENT_OTHER): Admission: RE | Admit: 2008-12-16 | Discharge: 2009-03-09 | Payer: Self-pay | Admitting: Internal Medicine

## 2009-03-23 ENCOUNTER — Encounter (HOSPITAL_BASED_OUTPATIENT_CLINIC_OR_DEPARTMENT_OTHER): Admission: RE | Admit: 2009-03-23 | Discharge: 2009-03-31 | Payer: Self-pay | Admitting: Internal Medicine

## 2010-05-12 ENCOUNTER — Emergency Department (HOSPITAL_COMMUNITY)
Admission: EM | Admit: 2010-05-12 | Discharge: 2010-05-12 | Payer: Self-pay | Source: Home / Self Care | Admitting: Emergency Medicine

## 2010-06-09 ENCOUNTER — Emergency Department (HOSPITAL_COMMUNITY)
Admission: EM | Admit: 2010-06-09 | Discharge: 2010-06-09 | Payer: Self-pay | Source: Home / Self Care | Admitting: Emergency Medicine

## 2010-07-22 ENCOUNTER — Encounter: Payer: Self-pay | Admitting: Family Medicine

## 2010-07-23 ENCOUNTER — Encounter: Payer: Self-pay | Admitting: Internal Medicine

## 2010-07-23 ENCOUNTER — Encounter: Payer: Self-pay | Admitting: Urology

## 2010-09-11 LAB — CBC
HCT: 46.2 % (ref 39.0–52.0)
Hemoglobin: 16 g/dL (ref 13.0–17.0)
MCH: 30.8 pg (ref 26.0–34.0)
MCHC: 34.6 g/dL (ref 30.0–36.0)
MCV: 89 fL (ref 78.0–100.0)
Platelets: 204 K/uL (ref 150–400)
RBC: 5.19 MIL/uL (ref 4.22–5.81)
RDW: 12.5 % (ref 11.5–15.5)
WBC: 8.3 10*3/uL (ref 4.0–10.5)

## 2010-09-11 LAB — URINE MICROSCOPIC-ADD ON

## 2010-09-11 LAB — URINALYSIS, ROUTINE W REFLEX MICROSCOPIC
Bilirubin Urine: NEGATIVE
Glucose, UA: NEGATIVE mg/dL
Ketones, ur: NEGATIVE mg/dL
Nitrite: POSITIVE — AB
Protein, ur: 30 mg/dL — AB
Specific Gravity, Urine: 1.013 (ref 1.005–1.030)
Urobilinogen, UA: 0.2 mg/dL (ref 0.0–1.0)
pH: 8 (ref 5.0–8.0)

## 2010-09-11 LAB — DIFFERENTIAL
Basophils Absolute: 0 10*3/uL (ref 0.0–0.1)
Basophils Relative: 0 % (ref 0–1)
Eosinophils Absolute: 0.3 K/uL (ref 0.0–0.7)
Eosinophils Relative: 4 % (ref 0–5)
Lymphocytes Relative: 27 % (ref 12–46)
Lymphs Abs: 2.3 K/uL (ref 0.7–4.0)
Monocytes Absolute: 1 10*3/uL (ref 0.1–1.0)
Monocytes Relative: 12 % (ref 3–12)
Neutro Abs: 4.7 10*3/uL (ref 1.7–7.7)
Neutrophils Relative %: 57 % (ref 43–77)

## 2010-09-11 LAB — COMPREHENSIVE METABOLIC PANEL WITH GFR
ALT: 36 U/L (ref 0–53)
AST: 25 U/L (ref 0–37)
Albumin: 4.4 g/dL (ref 3.5–5.2)
CO2: 26 meq/L (ref 19–32)
Calcium: 10.2 mg/dL (ref 8.4–10.5)
Creatinine, Ser: 0.83 mg/dL (ref 0.4–1.5)
GFR calc Af Amer: >60 mL/min (ref >60)
Sodium: 141 meq/L (ref 135–145)
Total Protein: 9.5 g/dL — ABNORMAL HIGH (ref 6.0–8.3)

## 2010-09-11 LAB — COMPREHENSIVE METABOLIC PANEL
Alkaline Phosphatase: 92 U/L (ref 39–117)
BUN: 16 mg/dL (ref 6–23)
Chloride: 103 mEq/L (ref 96–112)
GFR calc non Af Amer: >60 mL/min (ref >60)
Glucose, Bld: 118 mg/dL — ABNORMAL HIGH (ref 70–99)
Potassium: 4.3 mEq/L (ref 3.5–5.1)
Total Bilirubin: 0.6 mg/dL (ref 0.3–1.2)

## 2010-09-11 LAB — LIPASE, BLOOD: Lipase: 28 U/L (ref 11–59)

## 2010-09-12 LAB — BASIC METABOLIC PANEL
Calcium: 9.9 mg/dL (ref 8.4–10.5)
GFR calc Af Amer: >60 mL/min (ref >60)
GFR calc non Af Amer: >60 mL/min (ref >60)
Glucose, Bld: 126 mg/dL — ABNORMAL HIGH (ref 70–99)
Sodium: 140 mEq/L (ref 135–145)

## 2010-09-12 LAB — DIFFERENTIAL
Basophils Absolute: 0.1 10*3/uL (ref 0.0–0.1)
Basophils Relative: 1 % (ref 0–1)
Eosinophils Absolute: 0.3 10*3/uL (ref 0.0–0.7)
Lymphocytes Relative: 31 % (ref 12–46)
Neutrophils Relative %: 51 % (ref 43–77)

## 2010-09-12 LAB — CBC
Hemoglobin: 14.9 g/dL (ref 13.0–17.0)
MCHC: 34.4 g/dL (ref 30.0–36.0)

## 2010-10-06 LAB — GLUCOSE, CAPILLARY: Glucose-Capillary: 106 mg/dL — ABNORMAL HIGH (ref 70–99)

## 2010-10-09 LAB — GLUCOSE, CAPILLARY: Glucose-Capillary: 104 mg/dL — ABNORMAL HIGH (ref 70–99)
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
## 2010-11-14 NOTE — Op Note (Signed)
NAMEJAMILL, WETMORE              ACCOUNT NO.:  [id number]   MEDICAL RECORD NO.:  [id number]          PATIENT TYPE:  INP   LOCATION:  3743                         FACILITY:  [facility]   PHYSICIAN:  [name], M.D.  DATE OF BIRTH:  August 17, 1945   DATE OF PROCEDURE:  06/10/2008  DATE OF DISCHARGE:  06/09/2008                               OPERATIVE REPORT   PREOPERATIVE DIAGNOSIS:  Right ureteropelvic junction stone, right  hydronephrosis and sepsis.   POSTOPERATIVE DIAGNOSES:  Right ureteropelvic junction stone, right  hydronephrosis and sepsis.   PROCEDURE:  Cystoscopy, urethral dilation and attempted right retrograde  pyelogram.   SURGEON:  Danae Chen, M.D.   ANESTHESIA:  General.   INDICATIONS:  The patient is a 65 years old male who was admitted with  sepsis.  Workup included a CT scan that showed a 9 x 11 mm stone at the  right UPJ with mild-to-moderate hydronephrosis.  The patient is status  post CVA and has a SP tube for iatrogenic hypospadias secondary to  chronic indwelling Foley catheter.  I had discussed the treatment  options with his power of attorney.  Those options are cystoscopy,  retrograde pyelogram, insertion of double-J stent, holmium laser of the  UPJ stone or if the stone and could not be extracted, percutaneous  nephrostomy or ESWL.  They agreed to proceed with cystoscopy and  retrograde pyelogram.  The patient is brought to the OR at [facility]  for the procedure.   The patient was identified by his wrist band and proper time-out was  taken.   Under general anesthesia he was prepped and draped and placed in the  supine position.  A panendoscope was passed through the urethra.  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  A guidewire was then passed through the  cystoscope.  The cystoscope was removed.  The urethra was then dilated  with [facility] dilators up to #20-French.  Then the cystoscope  was passed  without difficulty in the bladder.  He has a moderate prostatic  hypertrophy.  He has a small contracted bladder and bladder mucosa is  reddened.  There is no tumor in the bladder.  There is some edema at the  level of the trigone and more on the right side.  The left ureteral  orifice was easily visualized.  However, I could not identify the right  ureteral orifice.  Several attempts were made to pass a Glidewire  through what appears to be the ureteral orifice but were unsuccessful.  One ampule of indigo carmine was then given intravenously.  After 5  minutes there was blue efflux from the left ureteral orifice but there  was no efflux from the right ureteral orifice.  Then at this point it  was decided to end the procedure.  Cystoscope was removed.  The  suprapubic catheter was replaced with a #18-French Foley.  The  cystoscope was removed.   The patient tolerated the procedure well.   Our plan is to place a percutaneous nephrostomy to drain  the kidney.  The treatment of the stone will then be discussed with the family and  those options will be PCNL or ESL.      [name], M.D.  Electronically Signed     MN/MEDQ  D:  06/10/2008  T:  06/10/2008  Job:  [id number]

## 2010-11-14 NOTE — Assessment & Plan Note (Signed)
[facility]   NAME:  [name], [name]:  [id number]   MEDICAL RECORD NO.:  [id number]      DATE OF BIRTH:  1946-04-21   PHYSICIAN:  Ardath Sax, M.D.           VISIT DATE:                                   OFFICE VISIT   He has an area over his coccyx, which is over 5 x 2.5 x 0.5 cm.  It  looks nice and clean with good granulation tissue.  It is not bothering  him whatsoever.  He is being feed by the PEG tube and he is followed  there at the nursing home and they changed his dressing 3 times a week,  and he returns here every 3 weeks.  So, we changed the dressing and  measured it out and it has stayed about the same, but it is nice and  clean and is not giving him any problems.  It is 3 cm x 2 cm x 0.2 cm.      Ardath Sax, M.D.     PP/MEDQ  D:  02/10/2009  T:  02/11/2009  Job:  [id number]

## 2010-11-14 NOTE — Discharge Summary (Signed)
[name], [name]              ACCOUNT NO.:  [id number]   MEDICAL RECORD NO.:  [id number]          PATIENT TYPE:  AMB   LOCATION:  DAY                          [facility]ACILITY:  [facility]   PHYSICIAN:  Herbie Saxon, MDDATE OF BIRTH:  March 07, 1946   DATE OF ADMISSION:  06/09/2008  DATE OF DISCHARGE:  06/09/2008                               DISCHARGE SUMMARY   ADDENDUM   The patient's clinical condition remains stable.  He was started on  Arixtra for DVT prophylaxis 2.5 mg subcu daily.  Leukocytosis has been  trending down on IV vancomycin and Zosyn; today will be day 9.  Hypertension has improved.  Hypokalemia has been repleted.  Chest x-ray  on June 16, 2008, showed some improvement in bibasilar atelectasis.  The urinalysis was showing red blood 2 and 10-12 wbc's.  Dig control has  been optimally improved with inclusion of Lantus insulin 14 units  bedtime today.  The patient has been scheduled for Urology procedure as  definitive treatment for right urolithiasis on Tuesday, June 22, 2008.  The wound culture of June 17, 2008, came positive for MRSA  and the patient is being continued on his IV vancomycin, Zosyn, and  contact isolation.  Clinical condition remains stable.  Prognosis  guarded.   PHYSICAL EXAMINATION:  VITAL SIGNS:  Temperature is 98, pulse 97,  respiratory rate is 18, and blood pressure 102/59.  He is stable,  comfortable in bed, not in acute respiratory distress.  HEENT:  Pupils are equal, reactive to light and accommodation,  Extraocular muscles are intact.  HEART:  Heart sounds 1 and 2, regular.  CHEST:  Clear.  EXTREMITIES:  There is a mild left upper warmth and tenderness.  Bilateral AKA.  No edema.  No erythema at this time.  NEUROLOGIC:  He is alert and follows commands.   LABORATORY DATA:  WBC 7.8, hematocrit 28, and platelet count 217,  glucose level is 218.  Advised to continue getting p.r.n. analgesics and  antibiotics.  We will review his  CBC in the morning and follow Urology  for further recommendation.   DISPOSITION:  In the next 2 to 4 days after the urology procedure  depending on the clinical course.   Final discharge notes to be dictated.      Herbie Saxon, MD  Electronically Signed     MIO/MEDQ  D:  06/21/2008  T:  06/22/2008  Job:  [id number]

## 2010-11-14 NOTE — Op Note (Signed)
NAMESEVERIN, BOU              ACCOUNT NO.:  [id number]   MEDICAL RECORD NO.:  [id number]          PATIENT TYPE:  AMB   LOCATION:  DAY                          FACILITY[facility]   PHYSICIAN:  [name], M.D.  DATE OF BIRTH:  1945-10-02   DATE OF PROCEDURE:  06/22/2008  DATE OF DISCHARGE:                               OPERATIVE REPORT   PREOPERATIVE DIAGNOSIS:  Right renal stone.   POSTOPERATIVE DIAGNOSIS:  Right renal stone.   PROCEDURE DONE:  Dilation of nephrostomy tract and nephroscopy.   SURGEON:  Danae Chen, M.D. and Dr. Miles Costain.   ANESTHESIA:  General.   INDICATIONS:  The patient is a 65 year old of male status post CVA who  was admitted with urosepsis.  It was found by CT scan to have a right  UPJ obstructing stone.  Attempts were made to do ureteroscopy and stone  extraction on June 10, 2008 however, the right ureteral orifice  could not be identified.  He had a right percutaneous nephrostomy done  and he is now scheduled for percutaneous nephrolithotomy.   The patient was identified by his wristband and proper time-out was  taken.   He was then placed in the prone position under general anesthesia.  The  nephrostomy tract was then dilated by Dr. Miles Costain.  Then the nephroscope  was passed through the Amplatz sheath.  The stone could not be  visualized.  Prior to the procedure it was in the renal pelvis.  The  nephroscope was passed into the renal pelvis and the stone could not be  seen.  The nephroscope was then removed.  A flexible cystoscope was  passed through the Amplatz sheath and the cystoscope was passed in the  upper ureter by following the guidewires and there was no stone in the  renal pelvis nor in the upper ureter.  I was not able to pass the  cystoscope in the upper pole calix nor in the lower pole calix to find  out if the stone had migrated into those calyces.  After several  attempts it was then decided to abort the procedure.  A  nephroureteral  catheter was passed over one of the guidewire in the bladder.  A #20-  French Councill tip catheter was passed over the other guidewire in the  renal pelvis.  Nephrostogram showed no evidence of extravasation of  contrast.  Both catheters will be left in place.  A nephrostogram will  be obtained when the urine is clear and if there is a filling defect in  the kidney then the patient will be taken back to the OR for a second  look procedure or ureteroscopy depending on the location of the filling  defect.      [name], M.D.  Electronically Signed     MN/MEDQ  D:  06/22/2008  T:  06/23/2008  Job:  [id number]

## 2010-11-14 NOTE — Assessment & Plan Note (Signed)
[facility]   NAME:  CAYDIN, YEATTS NO.:  [id number]   MEDICAL RECORD NO.:  [id number]      DATE OF BIRTH:  [birth year]   PHYSICIAN:  Maxwell Caul, M.D. VISIT DATE:  01/06/2009                                   OFFICE VISIT   HISTORY:  Mr. [name] is a 65 year old gentleman who we have been  following for a wound on his sacral area.  Most recently we have been  using a collagen based wound dressing to this area.  He is at [employer].   PHYSICAL EXAMINATION:  His superficial wounds which we previously  labeled #4 and #5 are really much improved.  The area on his coccyx  measures 5.5 x 2.5 x 0.4, however, there are islands of  epithelialization here and generally I actually think the whole area  looks better.  I do not have any allusion that we will be able to  granulate this area and fill in the hole on the usual sense, however, I  would be happy if we could epithelialize this area even if it was left  to have some indentation.  This would not be a perfect cosmetic result,  however, I think any degree of healing would be satisfactory.   IMPRESSION:  All pressure relief issues have been discussed.  He is fed  via PEG tube, but eats on top of this is well.  There does not appear to  be any nutritional issues.   We will see him again in this clinic in 3 weeks.           ______________________________  Maxwell Caul, M.D.     MGR/MEDQ  D:  01/06/2009  T:  01/07/2009  Job:  [id number]

## 2010-11-14 NOTE — Discharge Summary (Signed)
[name], MONTFORT NO.:  [id number]   MEDICAL RECORD NO.:  [id number]          PATIENT TYPE:  AMB   LOCATION:  DAY                          FACILITY:  [facility]   PHYSICIAN:  Ladell Pier, M.D.   DATE OF BIRTH:  1945/10/29   DATE OF ADMISSION:  06/22/2008  DATE OF DISCHARGE:                               DISCHARGE SUMMARY   ADDENDUM:  To the dictated discharge summary done on the fourteenth by  Isidor Holts, M.D. and on the twenty-first Herbie Saxon,  MD.  These are the events for today, the twenty-eighth.   The patient had no events today.  He did have a CT scan done to follow  up on the urethral stone and hydronephrosis.  The CAT scan showed  resolution of the right hydronephrosis, status post the percutaneous  nephrostomy and ureteral stent placement  and right ureteral stent  distal tip is located in the prostatic urethra.  The patient should  follow up outpatient with urology.   DISCHARGE MEDICATIONS:  1. Albuterol/Atrovent nebulizer daily every 6 hours.  2. Lantus sliding scale insulin.  3. Potassium 40 mEq daily.  4. Dilantin 200 mg per tube twice q.12h.  5. Protonix 40 mg per tube q.24h.  6. Lantus 14 units nightly.  7. Iron sulfate 300 mg per tube twice daily.  8. Multivitamin per tube daily.  9. Depakote 750 mg per tube twice daily.  10.Free water flushes 200 mL q.6h.  11.Jevity 1.2 at 60 mL an hour   FOLLOW-UP APPOINTMENTS:  As mentioned the patient is to follow up with  urology outpatient and to call for an appointment.   There were no other events noted throughout the time period.   DISCHARGE LABS:  Sodium 142, potassium 3.7, chloride 105, CO2 of 31,  glucose 119, BUN 7, creatinine 0.64, calcium 9.5.  WBC 10.8, hemoglobin  8.8, MCV 92.9, platelets 257.   Please see previous dictated discharge summary for details of  hospitalization.      Ladell Pier, M.D.  Electronically Signed     NJ/MEDQ  D:  06/28/2008  T:   06/28/2008  Job:  [id number]

## 2010-11-14 NOTE — Group Therapy Note (Signed)
NAMEAPOLONIO, [name]:  [id number]   MEDICAL RECORD NO.:  [id number]          PATIENT TYPE:  INP   LOCATION:  3743                         FACILITY:  [facility]   PHYSICIAN:  Eduard Clos, MDDATE OF BIRTH:  08/21/1945                                 PROGRESS NOTE   HOSPITAL COURSE:  A 65 year old male with known history of bilateral AKA with peripheral  vascular disease, seizure disorder, hypertension, hyperlipidemia,  history of closed head injury status post craniotomy with VP shunt  placement, neurogenic bladder, status post suprapubic catheter.  Was  brought into the ER because of decreased responsiveness.  The patient  also was initially found to be hypotensive.  The patient was initially  admitted to ICU.  The patient's low blood pressure responded to IV  fluids. The patient was started on empiric antibiotics.  On admission,  the patient had a chest x-ray which did not show any acute findings.  UA  was compatible with UTI.  The patient had a CT head which did not show  any acute findings.  Initially, consult was obtained.  As the patient's  condition improved, the patient was transferred to telemetry floor.  The  patient also was found to be hyponatremic.  Increased LFTs also a  problem.  Sonogram of the abdomen showed right-sided hydronephrosis for  which a CT urogram was done which showed right-sided hydronephrosis with  a partial obstructing stone in the right L4 at this time. At this time,  urology consult was obtained.  There was mild nonspecific elevation of  troponins.  The patient's LFTs also started improving.  At this time,  the patient's mentation has largely improved.  He is following commands.  Urine cultures are growing enterococcus sensitive to ampicillin and  vancomycin.  Due to persistence, p.o. Flagyl was added.  At this time,  p.o.'s have improved over the last 24 hours.  The patient had difficulty  swallowing.  A swallow evaluation  was obtained.  Per swallow, the  patient cannot be given oral feeds at this time.  A Panda tube is  placed.  Nutrition will be started.  A repeat swallow evaluation will be  done.  If the patient fails, will need PEG tube.   PROCEDURES:  1. Chest x-ray June 07, 2008.  The patient does have segmental      atelectasis changes.  Recommend followup.  2. CT abdomen and pelvis June 07, 2008.  Large 9 mm partially      obstructing stone at the right ureteropelvic junction with mild-      moderate hydronephrosis.  Bilateral small pleural effusion.      Suprapubic catheter without evidence of complication.  Mild edema.      Circumferential thickening in the rectum could represent      prostatitis.  3. Chest x-ray June 04, 2008.  Minimal bibasilar atelectasis,      superimposed infection at the lung base cannot be excluded.  4. CT head without contrast on June 04, 2008.  Chronic      periventricular white matter changes and encephalomalacia changes,  enlarged lateral ventricular third and fourth ventricles.  Probably      chronic process, but interval change cannot be assessed given the      lack of prior studies.  No evidence of intracranial hemorrhage or      acute infarction.  5. Ultrasound of the abdomen June 06, 2008 shows liver is normal.      No gallstone.  Mild hydronephrosis of the right kidney, probable      small calculus given the lower pole.   FINAL DIAGNOSES:  1. Septic shock, probably secondary to enterococcus urinary tract      infection.  2. Possible proctitis.  3. Acute renal failure, improving.  4. Hypernatremia probably from dehydration.  5. Stage 4 decubitus ulcer.  6. Metabolic encephalopathy.  7. Positive troponins.  8. Bilateral above-knee amputation.  9. Abnormal liver function tests.   PLAN:  At this time, urology consult has been requested for his right-sided  hydronephrosis.  We will discontinue his IV fluids, place the patient on   free fluids q.6 h. for his hypernatremia.  Continue the antibiotics.  If  patient again fails swallow, will need a PEG tube placement.  Further  recommendation and discharge medication to be addressed by the  discharging MD.      Eduard Clos, MD  Electronically Signed     ANK/MEDQ  D:  06/08/2008  T:  06/08/2008  Job:  [id number]

## 2010-11-14 NOTE — Assessment & Plan Note (Signed)
[facility]   NAME:  SULO, JANCZAK NO.:  [id number]   MEDICAL RECORD NO.:  [id number]      DATE OF BIRTH:  11/29/1945   PHYSICIAN:  Maxwell Caul, M.D. VISIT DATE:  12/23/2008                                   OFFICE VISIT   Mr. [name] is a 65 year old gentleman, who is a longstanding resident of  [facility].  He is a gentleman,  who is a diabetic per history of hypertension, bilateral AKAs.  He has a  long history of decubitus ulcers on his coccyx, buttocks, and lower back  per the physical therapist, who came with him.  They have been treating  him for a decubitus ulcer on his coccyx for years.  This gradually  closed over in 2009.  He was then hospitalized in late 2009 for septic  shock probably secondary to pyelonephritis.  He developed acute renal  failure, dehydration, and he reopened the large area of stage IV  decubitus ulcer over his coccyx.  He had a gastrostomy tube placed in  December 2009.  They have been treating this progressively for the first  6 months of this year most early on using Santyl to debride this, most  recently they have been using alginate, and they have had some good  success in getting this to close down.  However, they have been left  with a refractory area of wound in the lower coccyx sacral area as well  as a superficial wound into the right buttock.   On examination, his temperature is 98.5, pulse 74, respirations 20, and  blood pressure is 109/74.  The major wound areas on his sacral area  measuring 2 x 5.6 x 0.5.  There is some undermining from 12 to 2  o'clock, although this is not dramatic.  The wound goes right down at  least to periosteum.  Paradoxically, he actually has some islands of  epithelium going down into this wound.  There are some areas of this  that probably could be debrided.  However, I did not attempt this with a  scalpel today.  He also has 2  other areas of superficial wound also in  the area of previous scar.  He has had obviously many prior wounds as  evidenced by healed areas of scar in his bilateral buttocks and coccyx  area.   IMPRESSION:  Sacral decubitus ulcer.  I really felt after some  consideration that the best thing we could do here was a collagen-based  wound dressing.  The wound does not look like it has any drainage.  Therefore, I recommended discontinuing any of the alginate.  I elected  to proceed with Puracol hydrogel to not only the coccyx wounds, but to  the more superficial wounds on his buttock.  The collagen should allow  for some degree of autolysis of the eschar that is present.  As  mentioned, there is some attempt to epithelialize this.  If we could  promote this, I think we might get the wound epithelialized.  Although  it would not be a perfect healing result, I think anything that could  get this area to close in would be satisfactory.   They have already used all  pressure relief things at their disposal  including a specialized mattress.  He is being fed via PEG tube, but  eats on top of this as well.  Therefore, there does not appear to be any  nutritional issues.   Wound care orders were sent back.  We will see him again in 2 weeks.            ______________________________  Maxwell Caul, M.D.    MGR/MEDQ  D:  12/23/2008  T:  12/24/2008  Job:  [id number]

## 2010-11-14 NOTE — Assessment & Plan Note (Signed)
[facility]   NAME:  ARLIE, RIKER NO.:  [id number]   MEDICAL RECORD NO.:  [id number]      DATE OF BIRTH:  Oct 14, 1945   PHYSICIAN:  Ardath Sax, M.D.           VISIT DATE:                                   OFFICE VISIT   He is a gentleman who has had multiple strokes.  He has diabetes,  hypertension, and bilateral lower extremity amputation.  He has a  decubitus over his sacrum which is taken care of very well at the  nursing home.  They are putting a surrounding area in the buttocks of  zinc oxide and putting Promogran in the small ulcer right over the  sacrum which is approximately 4 cm long and 0.5 cm wide.  Today, we  washed it and replaced the dressing with a zinc oxide around it and  Promogran in the decubitus itself.  It really looks very good, I do not  think this gentleman will ever really epithelize it over but he is doing  well with no problems.      Ardath Sax, M.D.     PP/MEDQ  D:  02/10/2009  T:  02/10/2009  Job:  [id number]

## 2010-11-14 NOTE — Group Therapy Note (Signed)
NAMEKAULIN, CHAVES NO.:  [id number]   MEDICAL RECORD NO.:  [id number]          PATIENT TYPE:  INP   LOCATION:                               FACILITY:  [facility]   PHYSICIAN:  Isidor Holts, M.D.  DATE OF BIRTH:  17-Nov-1945                                 PROGRESS NOTE   ADDENDUM:   DISCHARGE DIAGNOSES:  1. Sepsis secondary to enterococcal urinary tract infection.  2. Right pelvic urolithiasis/obstructive uropathy, status post      percutaneous nephrostomy 06/10/2008.  3. Hospital-acquired pneumonia.  4. Acute renal failure.  5. Dehydration/hypernatremia.  6. Probable proctitis.  7. Stage IV sacral decubitus  8. Toxic/metabolic encephalopathy.  9. History of seizure disorder.  10.Chronic anemia requiring transfusion to 2 units PRBC on 06/12/2008.  11.Dysphagia, status post percutaneous endoscopic gastrostomy (PEG)      06/14/2008.  12.Peripheral vascular disease, status status-post bilateral above-      knee amputation.  13.Hypertension.  14.History of previous cerebrovascular accident (CVA).  15.History of closed head trauma, status post ventriculoperitoneal      shunt.  16.Neurogenic bladder  17.Dyslipidemia.   Discharge medications:  These will be listed in an addendum at the  appropriate time, by discharging M.D.   PROCEDURES:  1. Refer to interim summary dictated 06/08/2008 by Dr. Armanda Heritage.  2. Cystoscopy 06/10/2008 by Dr. Audrie Gallus with attempted insertion of      double-J tube, procedure was unsuccessful.  3. Ultrasound guided percutaneous right nephrostomy done 06/10/2008 by      Dr. Windy Fast, uncomplicated procedure.  4. Chest x-ray dated 06/11/2008.  This showed right-sided PICC line      with tip overlying the lower SVC and unchanged bibasilar      atelectasis.  5. Chest x-ray dated 06/13/2008.  This showed stable bibasilar      infiltrates.  6. Gastrostomy tube placement with fluoroscopic guidance 06/14/2008 by      Dr. Rob Hickman Hent, interventional radiologist., uncomplicated      procedure.  7. Right nephrostogram 06/14/2008.  This showed decompressed right      renal collecting system, persistent filling defect in the renal      pelvis consistent with a stone.   CONSULTATIONS:  Refer to interim summary dictated 06/08/2008 by Dr. Demaris Callander.  1. Dr. Su Grand, urologist.  2. Dr. Oley Balm, interventional radiologist  3. Dr. Abundio Miu, interventional radiologist.   For details of admission history and subsequent clinical course, refer  to above-mentioned interim summary. However, for the period from  06/09/2008 to 06/14/2008, i.e., the date of this dictation, the  following are pertinent:  The patient's urine cultures confirmed  Enterococcus sensitive to Ampicillin, as well as to Vancomycin.  The  patient's antibiotic therapy which consisted of Vancomycin and Zosyn,  were rationalized to monotherapy with Ampicillin on 06/09/2008.  Hypernatremia was successfully addressed with hypotonic fluids both via  tube, as well as intravenously, so that sodium level dropped from 156 on  06/10/2008 to 142 on 06/14/2008.  He underwent an attempted cystostomy  and double J tube  placement on 06/10/2008, to address right obstructive  uropathy secondary to right pelvic stone by Dr. Su Grand on 06/10/2008.  However, this was a failed procedure.  Interventional radiology  consultation was kindly provided by Dr. Oley Balm, who on the same  day, performed a right nephrostomy tube placement in an uncomplicated  procedure with successful decompression of the right renal pelvis, as  evidenced by follow-up right nephrostogram on 06/14/2008.  The patient  for the next couple of days, appeared clinically stable.  However, on  06/12/2008, he spiked a pyrexia of 102.7.  This continued until  06/13/2008, at which time chest auscultative findings revealed coarse  crackles consistent with possible pneumonia.  He  was therefore switched  back to combination antibiotic therapy with Vancomycin and Zosyn, on  suspicion of hospital-acquired pneumonia, against the  background of pre-  existent urinary tract enterococcal infection.  As of day 06/14/2008, he  was on day #2 of Vancomycin and Zosyn, and had defervesced.  Chest x-ray  confirmed bibasilar infiltrates.  A total of a further 14 days of  antibiotic therapy is anticipated.  There were no other problems  referable to proctitis.  The patient as of 06/14/2008 was on day #8 of a  planned 10-day course of Flagyl therapy.  He continues to receive local  care for a stage IV sacral decubitus.  Mental status appears to be at  baseline, i.e. toxic/ metabolic encephalopathy appears to have resolved.  He did have one seizure-like episode on 06/11/2008, which responded to a  single intravenous dose of Ativan.  There have been no recurrences  since.  Likely, his hypernatremia had lowered his seizure threshold.  The patient continues to remain normotensive.  Acute renal failure has  resolved.  BUN on 06/14/2008 was 11, with a creatinine of 0.94.  He  experienced a trending down in hemoglobin during the course of this  hospitalization and on 06/12/2008, hemoglobin dropped down to 7.7 with  no evidence of overt bleed.  He was transfused with 2 units of PRBC,  resulting in a satisfactory post transfusion bump in hemoglobin to 9.5  on 06/13/2008.  Hemoglobin has remained stable thereafter and was 9.8 on  06/14/2008.  The patient was commenced on Panda tube feeds after speech  pathology evaluation indicated that he was at high risk for aspiration  and alternative feeding methods were recommended.  Interventional  radiology was consulted for PEG tube placement and this was carried out  in an uncomplicated procedure on 06/14/2008 by Dr. Abundio Miu.   DISPOSITION:  This will be elucidated in detail in an addendum at the appropriate  time, by discharging M.D.   However, the patient has made significant  strides on his path to recovery, and it is anticipated that over the  next few days provided he has no relapse of pyrexia, he may become  clinically stable enough for discharge to skilled nursing facility to be  considered with completion of his antibiotic course of treatment at the  skilled nursing facility.      Isidor Holts, M.D.  Electronically Signed     CO/MEDQ  D:  06/14/2008  T:  06/14/2008  Job:  [id number]   cc:   [name]. Chilton Si, M.D.  Fax: [phone]

## 2010-11-14 NOTE — Consult Note (Signed)
[name], [name]              ACCOUNT NO.:  [id number]   MEDICAL RECORD NO.:  [id number]          PATIENT TYPE:  INP   LOCATION:  3743                         FACILITY:  [facility]   PHYSICIAN:  [name], M.D.  DATE OF BIRTH:  [birth year]   DATE OF CONSULTATION:  06/08/2008  DATE OF DISCHARGE:                                 CONSULTATION   REASON FOR CONSULTATION:  Right hydronephrosis and right UPJ stone.   The patient is a 65 year old of male who was seen in the emergency room  on June 04, 2008 with decreased responsiveness.  He is a resident of  a nursing home.  He was found then on June 04, 2008 to have  decreased responsiveness and low blood pressure.  His temperature went  up to 104.8, and the patient was admitted for further evaluation and  treatment.  He has a history of CVA and has an indwelling Foley catheter  for iatrogenic hypospadias secondary to chronic indwelling Foley  catheter.  The patient was rehydrated and started on IV Zosyn and  vancomycin.  Workup included a CT scan that showed a 9 x 11 partially  obstructing stone at the right UPJ with mild-to-moderate hydronephrosis  and a nonobstructing 6-mm stone in the lower pole of the kidney.  The  patient responded to treatment.  His highest temperature was 102.5 on  June 07, 2008, and today on June 08, 2008, he has been thus far  afebrile.  He is status post bilateral AKA and he also has a history of  seizure disorder and had a CVA in the past.  The suprapubic catheter is  draining clear urine.   PAST MEDICAL HISTORY:  Positive for peripheral vascular disease,  bilaterally above-knee amputations, seizure disorder, sacral decubitus,  CVA, hypertension, anemia, a close head injury with status post  craniotomy and VP shunt, and he has an SP tube.   PAST SURGICAL HISTORY:  Bilateral above-knee amputations, status post  craniotomy, status post insertion of SP tube.   ALLERGIES:  He is allergic to  HEPARIN.   MEDICATIONS:  1. Depakote 150 mg twice a day.  2. Dilantin 50 mg 3 tablets twice a day.  3. Aspirin 325 mg a day.  4. Ferrous sulfate 325 mg a day.  5. Vitamin C 500 mg.  6. Ditropan 5 mg 3 times a day.  7. Colace 100 mg twice a day.  8. MiraLax 17 g in fluids twice a day.   SOCIAL HISTORY:  He does not smoke nor drink and he is a nursing home  resident.   FAMILY HISTORY:  No family history can be obtained from the patient.  He  has 2 sisters and 1 brother.   REVIEW OF SYSTEMS:  As per HPI.   PHYSICAL EXAMINATION:  GENERAL:  This is a 65 year old male who is  currently in no acute distress.  VITAL SIGNS:  His temperature on admission was 104.8, it is now 98.4,  blood pressure is 105/65, pulse 98, respirations 18.  SKIN:  Warm and dry.  No history can be obtained from the patient.  He  is status post CVA, but he is awake.  HEENT:  He is status post craniotomy.  He has pink conjunctivae.  Ears  and nose are within normal limits.  NECK:  Supple.  He has no cervical adenopathy.  No thyromegaly.  CHEST:  symmetrical.  LUNGS:  Clear.  HEART:  Regular rhythm.  ABDOMEN:  Moderately obese and nontender.  He has no CVA tenderness.  Liver, spleen, and kidneys are not palpable.  He has an SP tube and is  draining clear urine.  Bowel sounds are normal.  He has a coronal  hypospadias secondary to chronic indwelling Foley catheter.  Scrotum is  normal.  Both testicles are normal.  EXTREMITIES:  He is status post bilateral AKA.  RECTAL:  Deferred.   Hemoglobin on admission was 12.1, hematocrit 36.4 and WBC 17.1.  BUN 72,  creatinine 3.79, sodium 141, and potassium 4.1.  Today his hemoglobin is  10.1, hematocrit 29.9 and WBC 9.2.  BUN is down to 32, creatinine 1.59.  Urine culture is positive for Enterococcus.   I independently reviewed the CT scan and it shows a 9 x 11-mm stone at  the right UPJ with mild-to-moderate hydronephrosis and a 6-mm stone in  the lower pole of the  right kidney.   IMPRESSION:  Right renal stone, right ureteropelvic junction stone,  right hydronephrosis, sepsis, Enterococcus urinary tract infection,  status post cerebrovascular accident, status post craniotomy, status  post bilateral above-knee amputation and history of hypertension.   SUGGESTIONS:  Continue IV antibiotics.  Suggest insertion of right  double-J stent to relieve obstruction.  Then we will discuss the  treatment options of the UPJ stone with his family and primary care  physician.  The treatment options are ureteroscopy with holmium laser of  the UPJ stone, ESL or PCNL.  Final decision will be made after  discussion with the family and his primary care physician.      [name], M.D.  Electronically Signed     MN/MEDQ  D:  06/08/2008  T:  06/09/2008  Job:  [id number]

## 2010-11-17 NOTE — Consult Note (Signed)
NAMEFREDERICK, MARRO NO.:  [id number]   MEDICAL RECORD NO.:  [id number]          PATIENT TYPE:  INP   LOCATION:  3016                         FACILITY:  [facility]   PHYSICIAN:  Consuello Bossier., M.D.DATE OF BIRTH:  1946-05-28   DATE OF CONSULTATION:  07/10/2005  DATE OF DISCHARGE:                                   CONSULTATION   REFERRING PHYSICIAN:  Leighton Roach McDiarmid, M.D.   I was asked to see this patient by Dr. Perley Jain.  The patient is a very  unfortunate 65 year old male who was admitted on July 05, 2005, with a  complicated medical history of cerebrovascular accident, seizure disorder,  severe peripheral vascular disease, post bilateral above-knee amputations,  tobacco abuse, and large pressure sores which have become increasingly  difficult to manage because of infection and probable sepsis.  The patient  was admitted to the hospital approximately a month ago with problems related  to his below-knee amputations and he had a pressure sore which was in the  right trochanteric area, which was being treated by physical therapy with  daily dressing changes.   Examination shows that there is a good granulation tissue present for the  longer-standing healing right trochanteric pressure sore.  There is a large,  newer right ischial pressure sore, which is open widely with the area having  been debrided of much of the necrotic tissue which apparently was present.  There is still some necrotic tissue adherent around the edges.  There is an  even larger sacral pressure sore measuring 14 x 15 cm of full-thickness loss  down to the sacrum.  Again, there was some necrotic tissue around the edges  but apparently it is responding well to local wound care.   IMPRESSION:  Right trochanteric, right ischial and sacral pressure sores.   My suggestion is, in light of the patient's overall status and his multiple  pressure sores occurring in spite of all efforts to  prevent them, that we  continue daily pulse lavage, topical Accuzyme to aid in the debridement, wet-  to-dry dressings, and once these areas have developed an adequate  granulating base, possibly proceeding with treating one or more of these  areas with V.A.C. therapy in an effort to obtain healing.  I do not think  the patient is a candidate for any more aggressive surgical therapy but  rather is a better candidate for the V.A.C. therapy.      Consuello Bossier., M.D.  Electronically Signed     HH/MEDQ  D:  07/10/2005  T:  07/11/2005  Job:  [id number]
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
## 2010-11-17 NOTE — Assessment & Plan Note (Signed)
[facility]   NAMEMURRAY, DURRELL              ACCOUNT NO.:  [id number]   MEDICAL RECORD NO.:  [id number]      DATE OF BIRTH:  [birth year]   PHYSICIAN:  Theresia Majors. Tanda Rockers, M.D. VISIT DATE:  03/28/2006                                     OFFICE VISIT   PURPOSE OF TODAY'S VISIT:  [name] is a 65 year old, bilateral amputee  with a right hemiparesis who has had a sacral ulcer and a right ischial  gluteal ulceration.  He has been treated in the interim with a wound V.A.C.  The patient is accompanied by caretaker.  They both deny interim fever.  His  blood pressure is 92/54, respirations 18, pulse rate 72 and he is afebrile.   WOUND EXAM:  Inspection of the wounds show that the #1 sacral ulcer has a  central area of necrosis, most likely related to pressure.  Under Benzocaine  spray, this area was full-thickness debrided with the result in hemorrhage  control with direct pressure.  Wound #2 in the right gluteal area, in the  previous gluteal fold, has health granulation and shows evidence of volume  reduction attended with V.A.C. use.  There are no abscess formations and no  debridement was indicated.   DIAGNOSIS:  1. Improved wound.  2. Persistent pressure in wound #2.   MANAGEMENT PLAN & GOAL:  We are discontinuing the wound V.A.C. from use on  the sacral wound.  We have encouraged the patient and the caretaker to have  the patient sit up most of the day to off load the sacral area from the  deleterious effects of pressure.  The wound dressing should be a 0.25% of  Dakin's wash and irrigation x1 week, then we will initiate a b.i.d.  moist/moist dressing.  Care again to avoid pressure on the sacrum by sitting  up most of the day is encouraged.  With wound #2, the gluteal wound, we will  continue a wound V.A.C. with a gray sponge at 125 cm of negative pressure.  This will be managed per [facility] protocol.  We will re-evaluate the patient in 1  month.     ______________________________  Theresia Majors. Tanda Rockers, M.D.     [name]  D:  03/28/2006  T:  03/30/2006  Job:  [id number]   cc:   [facility]/Medical Director

## 2010-11-17 NOTE — Op Note (Signed)
[name], [name]              ACCOUNT NO.:  [id number]   MEDICAL RECORD NO.:  [id number]          PATIENT TYPE:  AMB   LOCATION:  DAY                          FACILITY[facility]  [facility]   PHYSICIAN:  [name], M.D.  DATE OF BIRTH:  1946/03/12   DATE OF PROCEDURE:  01/24/2006  DATE OF DISCHARGE:                                 OPERATIVE REPORT   PREOP DIAGNOSIS:  Urethral erosion paraphimosis.   POSTOP DIAGNOSIS:  Urethral erosion paraphimosis.   PROCEDURE DONE:  Cystoscopy, suprapubic cystostomy and circumcision.   SURGEON:  Dr. Brunilda Payor.   ANESTHESIA:  General.   INDICATIONS:  The patient is a 65 year old male, who has had an indwelling  Foley catheter for urinary incontinence.  He has not been circumcised.  He  was found on physical examination to have urethral erosion and paraphimosis.  The patient was scheduled for circumcision and suprapubic cystostomy about 2  weeks ago.  However, he went into urosepsis.  He was treated with  antibiotics and he is now stable and we will proceed with surgery.   Under general anesthesia, the patient was prepped and draped and placed in  the dorsal lithotomy position.  The Foley catheter was removed.  The patient  has urethral erosion through the penile shaft.  The flexible cystoscope was  inserted in the bladder.  From the point of the urethral erosion, the  urethra  is normal.  He has moderate prostatic hypertrophy.  The bladder  mucosa is normal.  There is no stone or tumor in the bladder.  The ureteral  orifices are in normal position and shape with clear efflux.  The cystoscope  was then removed.  The Lowsley sound was then passed through the urethra and  into the bladder and then tip of the Lowsley sound was palpable through the  suprapubic area.  A skin incision was then made over the tip of the Lowsley  sound.  The incision was carried down through the fascia and through the  bladder wall and the Lowsley sound was passed through the  suprapubic area.  A #16 Foley catheter was then passed into the jaws of the Lowsley sound and  pulled through the urethra.  The flexible cystoscope was then passed in the  urethra and the Foley catheter was gently pulled back into the bladder.  The  balloon of the Foley catheter was then inflated with 10 mL of water.  Then,  the Foley catheter was secured to the skin with #2-0 silk.  Then attention  was placed to the paraphimosis.  Because the patient was not previously  circumcised it was then decided to do a circumcision.  Two circumferential  incisions was made on the foreskin and the foreskin in between those 2  incisions was excised.  There was no foreskin at the area of the urethral  erosion.  The  foreskin in between those circumferential incisions was excised.  Hemostasis  was secured with electrocautery.  Skin approximation was then done with #4-0  chromic using running sutures.   The patient tolerated the procedure well and left the OR in  satisfactory  condition to post-anesthesia care unit.      [name], M.D.  Electronically Signed     MN/MEDQ  D:  01/24/2006  T:  01/24/2006  Job:  [id number]
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
## 2010-11-17 NOTE — Assessment & Plan Note (Signed)
[facility]   NAMEDARY, DILAURO              ACCOUNT NO.:  [id number]   MEDICAL RECORD NO.:  [id number]      DATE OF BIRTH:  April 24, 1946   PHYSICIAN:  Theresia Majors. Tanda Rockers, M.D.      VISIT DATE:                                     OFFICE VISIT   SUBJECTIVE:  Mr. Oelkers is a 65 year old man who is a resident of [facility].  We have been following him for 2 wounds, one an  ischial wound and #2 a sacral decubitus wound.  In the interim he has been  treated with moist/moist dressings and weekly 0.25% Dakin solution  irrigations.  There is some question as to whether or not he needed  antibiotics in addition to the current wound VAC.   OBJECTIVE:  Blood pressure is 98/60, respirations 14, pulse rate 70 and he  is afebrile.  Inspection of the wounds show overall improvement.  The sacral  wounds show overall improvement.  The sacral wound has decreased in volume.  There is advancement of epithelium.  There is a thin, exudative film, but  there is no particular malodor.  This wound was cultured.  In the #2, the  right ischial wound has decreased significantly in volume and is now 100%  granulated wound with a depth of 2 cm.  There is no exposed bone.  There is  cavitation and no undermining.  There is a thin, filmy exudate which is non-  malodorous and this was cultured.   IMPRESSION:  Improvement of the decubitus wounds.   PLAN:  We will continue moist/moist dressings with weekly washings with  0.25% Dakin solution.  We will continue to avoid pressure by allowing the  patient to sit up as much as possible and avoid direct pressure over the  sacrum and these ischial areas.  We will re-evaluate the patient in 1 month.  His cultures will determine the need for antibiotics.           ______________________________  Theresia Majors Tanda Rockers, M.D.     Cephus Slater  D:  04/25/2006  T:  04/26/2006  Job:  [id number]   cc:   Medical Director

## 2010-11-17 NOTE — H&P (Signed)
[name], [name]:  [id number]   MEDICAL RECORD NO.:  [id number]           PATIENT TYPE:   LOCATION:                                 FACILITY:   PHYSICIAN:  [name], M.D.     DATE OF BIRTH:   DATE OF ADMISSION:  06/06/2005  DATE OF DISCHARGE:                                HISTORY & PHYSICAL   PRIMARY CARE PHYSICIAN:  [name]   CHIEF COMPLAINT:  Sores on left leg causing pain.   HISTORY OF PRESENT ILLNESS:  Patient is a 66 year old African-American male  with a history of seizures, CVA, hypertension, hypercholesterolemia, tobacco  abuse, and severe peripheral vascular disease status post right BKA here for  left leg pain.  He came to the [name] on May 30, 2005 and was found to have  multiple decubitus ulcers as well as likely osteomyelitis of his left leg  and stump of his right BKA.  He refused treatment at that time and was sent  home as a DNR/DNI on Percocet to control pain.  Today he was taken to [name] by ambulance and then transported to [place].  He now would like to speak  to vascular orthopedic surgery and desires left BKA if necessary.  During  his last hospitalization he was deemed competent to make decisions.   REVIEW OF SYSTEMS:  Patient denies fever or weight loss.  CARDIOVASCULAR:  He has not had any chest pains or palpitations.  PULMONARY:  No shortness of  breath.  No coughing.  GI:  No diarrhea.  Constipation at times.  Last bowel  movement yesterday was normal.  GU:  No dysuria.  Patient does have a condom  catheter.  SKIN:  Patient has multiple sores on his skin as per HPI.  No  weakness.  Patient does not have any allergies to medications.   HOME MEDICATIONS:  1.  Flomax 0.8 mg daily.  2.  Phenytoin 100 mg t.i.d.  3.  Tylenol p.r.n.  4.  Advil p.r.n.  5.  Percocet 5/325 one to two tablets q.4h. p.r.n. pain.  6.  Patient just recently finished Augmentin 875 mg.  He took one tablet      p.o. b.i.d. x36  days.  This prescription was filled on April 13, 2005.   PAST MEDICAL HISTORY:  1.  Seizures.  2.  Hypercholesterolemia.  3.  Tobacco abuse.  4.  Hypertension.  5.  CVA.  6.  Osteomyelitis status post right BKA decubitus ulcers.  7.  Head injury in past with iron pipe requiring neurosurgery to place a      plate in his head in 1990.   FAMILY HISTORY:  Patient has seven brothers and seven sisters and is unsure  of any medical problems.  Mom and dad are deceased.  His father killed his  mother.   SOCIAL HISTORY:  Patient is disabled.  He lives with a friend in [place].  Is unclear whether or not this is assisted living.  He has a home health  nurse that helps him.  Is supposed  to be [facility] but apparently  this does not do a good job for him as patient has multiple decubitus  ulcers.  Patient denies any alcohol use.  He smokes two packs per week of  cigarettes.  He denies illegal drug use.  He would like to go back to [facility] after surgery but, per patient, the person in charge at [facility]. Fayrene Fearing is  threatening to put him into a nursing facility.  The patient does not desire  this.  Patient is competent to make decisions.   PHYSICAL EXAMINATION:  VITAL SIGNS:  Temperature 98.1, respiratory rate 18,  heart rate 91, blood pressure 106/62.  GENERAL:  Patient is a thin, malnourished African-American male.  He is  alert and in no acute distress.  HEENT:  Extraocular muscles were intact.  Pupils are equal, round, and  reactive to light and accommodation.  Mouth/throat showed dry mucous  membranes.  No exudates or erythema of oropharynx.  LUNGS:  Clear to auscultation anteriorly.  I was unable to lift patient to  hear posterior lung fields.  HEART:  Regular rate and rhythm.  No murmurs, rubs, or gallops.  ABDOMEN:  Positive per bowel sounds.  Normoactive.  Soft, nondistended,  nontender.  Questionable abdominal mass in left central abdomen.  EXTREMITIES:  Right BKA with  necrotic tissue at stump.  Also behind right  knee there is some area of desquamation.  Left leg, left heel has a large  ulcer.  Left lateral leg has a large necrotic lesion that is 7 x 9 and 2 cm  deep.  Patient has 2+ radial pulses.  NEUROLOGIC:  Patient is alert and oriented x3.  His cranial nerves II-XII  are grossly intact.  Patient is unable to walk because he is status post BKA  and is barely able to even turn himself.  SKIN:  As noted on extremities.  As well, as patient has a large sacral  necrotic ulcer and two symmetric ulcers on his buttocks, one on each side.  He has an ulcer on inside of his left leg near his groin.  Patient also has  a lesion on his right elbow.   LABORATORIES/TESTS:  [name] CBC showed white blood cells of 10.7,  hemoglobin of 8.9, hematocrit 27, platelets 397.  Sodium 137, potassium 3.3,  chloride 98, bicarbonate 32, BUN 4, creatinine 0.4, glucose 103.  Neutrophils of 79% with an ANC of 8.5.  Blood cultures were taken and are  pending.  Protein 5.8, albumin 1.8, calcium 8.8, AST 28, ALT 32, ALP 88,  Tbili 0.4.   ASSESSMENT/PLAN:  [name] is a 65 year old African-American male with a  history of right BKA secondary to a severe peripheral vascular disease,  decubitus ulcers, seizures, cerebrovascular accident, hypertension,  hypercholesterolemia, tobacco abuse who has likely osteomyelitis of his left  leg as well as right stump and severe decubitus ulcers.  1.  Peripheral vascular disease/osteomyelitis.  Patient likely has      osteomyelitis of his right BKA stump as well as of his left leg.  Three-      phase bone scan back on April 02, 2005 showed findings suspicious of      osteomyelitis of his left calcaneus and increased activity in the left      knee as well suspicious for osteomyelitis.  Today we consulted [facility] as patient's past amputation was done by them by Dr.  Gerkin.  They will see patient in the a.m. and  determine if vascular      needs to be involved.  We will start Zosyn and vancomycin for coverage      of infection.  2.  Decubitus ulcers.  Will get to wound consult and apply wet-to-dry      dressings for now.  Will consider culturing wound as patient has a      history of moderately resistant methicillin Staphylococcus aureus in      February 2006 in his wounds.  Intravenous vancomycin should cover this.      We will move patient multiple times which will be q.3 h. each day.  3.  Anemia.  We will consider an iron panel, but will discuss this with team      in the a.m.  Patient was slightly anemic even before his BKA with      hemoglobin about 11.  Due to his abdominal mass there is some concern      for constipation versus malignancy.  4.  Seizure disorder.  We will continue patient on his home dose of      Dilantin.  5.  Benign prostatic hypertrophy.  Will continue home dose of Flomax.  Will      consider trying a trial of no Foley.  We might get a urinalysis.  6.  Fluids, electrolytes, nutrition.  Patient is malnourished with a      decreased albumin and protein.  We will give supplements and a regular      diet.  We will replete his potassium with 20 mEq daily.  We will get a      nutrition consult.  7.  Tobacco abuse.  Patient to have a smoking cessation consult.  He will      also be on Nicoderm patches in the hospital.  Patient does have an      interest in stopping smoking.   DISPOSITION:  Will consult social work.  Patient needs better home health  care if he is going to be able to return home as he desires.     ______________________________  Alanda Amass    ______________________________  [name], M.D.    JH/MEDQ  D:  06/07/2005  T:  06/07/2005  Job:  [id number]

## 2010-11-17 NOTE — Consult Note (Signed)
[name], PIERCEFIELD NO.:  [id number]   MEDICAL RECORD NO.:  [id number]          PATIENT TYPE:  EMS   LOCATION:  MAJO                         FACILITY:  [facility]   PHYSICIAN:  Santiago Bumpers. Hensel, M.D.DATE OF BIRTH:  1946/04/19   DATE OF CONSULTATION:  DATE OF DISCHARGE:                                   CONSULTATION   CONSULTING PHYSICIAN:  Dwana Curd. Para March, M.D.   CHIEF COMPLAINT:  Left foot pain.   HISTORY OF PRESENT ILLNESS:  The patient is a patient well known to me from  the [facility] who has multiple medical problems that comes in  for a burning sensation in his left foot.   INTERVAL HISTORY:  The patient was recently hospitalized early this year  with osteomyelitis in his right lower extremity.  He had right BKA for this.  He did not follow up with surgery as instructed.  He had, during this  hospitalization earlier this year, a bone scan that was concerning for  osteomyelitis of his left lower extremity, however, he did not want to have  any other surgical intervention and he only wanted to do p.o. antibiotics at  home.  He refused placement.  In the interval, I had reports from the home  health agency that helped to look after Mr. Abalos that he has not been  compliant and his time at home has been complicated by a difficult social  situation.  In any event, every time I have seen him in the clinic, he has  adamantly stated that he wanted to go back home and continue outpatient  therapy.  I have tried to facilitate this by lining up home health physical  therapy and also by referring him to the [facility]  In spite of all this, his wounds are not healing and some of them have  gotten progressively worse.  He comes back in today with one of his home  health aids, who is concerned for his well being at home.   In talking with the patient today, he is stating that he does not want to be  admitted to the hospital and he  certainly does not want surgery.   REVIEW OF SYSTEMS:  Otherwise noncontributory.  Please see the note by Dr.  Mannie Stabile for other elements from the history.   PHYSICAL EXAMINATION:  Pertinent parts of physical exam are as follows:  GENERAL:  The patient is alert and oriented in no apparent distress.  He is  responsive with conversation.  SKIN:  Enlarged sacral decubitus wound with slough and erythema over the  coccyx.  He has two other similar lesions that are not as deep over his  right buttock and posterior portion of his right thigh.  He has necrotic  tissue that is not healing at all on the stump of his right BKA.  His left  leg is noted for a decubitus ulcer on the lateral aspect of his left leg  that has exposed tendon sheath.  He also has other ulcers located distally  on the left foot.  ASSESSMENT/PLAN:  The patient is a 65 year old African American male with  the following problems:  1.  Decubitus ulcers.  The patient at this point is still adamantly refusing      surgery.  I have talked extensively with the patient for his medical      options both as an outpatient and during this ER consultation.  He is      aware that optimal medical therapy would likely include surgery plus      antibiotics.  He has refused this.  He has tried outpatient antibiotic      therapy and failed this.  At this point, he is asking only for pain      control and to be sent back home.  He is refusing hospitalization, and      he is refusing surgery.  Of note, the patient was deemed competent      during a previous hospitalization this year.  He is able to make his own      choices.  He is aware of the situation.  He voices understanding of his      options and having been informed of his choices, he is deciding to go      home.  He is aware that he may be at home for period of time by himself      and may not be able to care for himself.  He voices understanding of      this and still desires to go  home.  He is asking for no medical      intervention other than pain control.  At this point, the patient voices understanding of his options.  He does not  appear __________  depressed or intoxicated such that his capability to make  an informed decision would be limited, and he is therefore able to decide to  go home.  I will arrange to have the wounds dressed.  He can have Percocet  5/325 one tab orally x1 now in the emergency department for pain control,  and he can be transported home as soon as this is available.  Other medical  issues can be followed up as an outpatient.  Of note, he is refusing  placement.  He is also refusing a consult with hospice or palliative care.  These have been offered to him.  He has voiced understanding of this, and he  is refusing this.  1.  I discussed code status with the patient.  He appears perfectly able to      make an informed decision about this.  He voiced understanding of the      options for resuscitation and intubation versus a no code blue.  He      elects to be a DNR DNI.  I have signed the orders for this and will      attach it to the note from Dr. Mannie Stabile.      Dwana Curd. Para March, M.D.    ______________________________  Santiago Bumpers. Leveda Anna, M.D.    GSD/MEDQ  D:  05/30/2005  T:  05/30/2005  Job:  [id number]   cc:   Dwana Curd. Para March, M.D.  Fax: [phone]   [facility]

## 2010-11-17 NOTE — Discharge Summary (Signed)
NAMETAMMIE, ELLSWORTH NO.:  [id number]   MEDICAL RECORD NO.:  [id number]          PATIENT TYPE:  INP   LOCATION:  5738                         FACILITY:  [facility]   PHYSICIAN:  Leighton Roach McDiarmid, M.D.DATE OF BIRTH:  March 19, 1946   DATE OF ADMISSION:  06/06/2005  DATE OF DISCHARGE:                                 DISCHARGE SUMMARY   ADDENDUM  This discharge summary is unchanged from that dictated on June 15, 2005.  The only change is instead of going out on Colace 100 mg twice daily the  patient will go out on Senokot-S one tablet twice daily.      Angeline Slim, M.D.    ______________________________  Leighton Roach McDiarmid, M.D.    AL/MEDQ  D:  06/18/2005  T:  06/18/2005  Job:  [id number]

## 2010-11-17 NOTE — Discharge Summary (Signed)
NAMEJERELL, [name]:  [id number]   MEDICAL RECORD NO.:  [id number]          PATIENT TYPE:  INP   LOCATION:  5738                         FACILITY:  [facility]   PHYSICIAN:  Melina Fiddler, MD DATE OF BIRTH:  [birth year]   DATE OF ADMISSION:  06/06/2005  DATE OF DISCHARGE:  06/15/2005                                 DISCHARGE SUMMARY   DISCHARGE DIAGNOSES:  1.  Peripheral artery disease.  2.  Status post bilateral above the knee amputation.  3.  History of seizures.  4.  Anemia.  5.  Benign prostatic hypertrophy.  6.  Multiple decubitus ulcers.  7.  Urinary tract infection.   PROCEDURES:  1.  Bilateral AKA on June 13, 2005.  2.  Status post transfusion of one unit PRBCs, also on June 13, 2005.  3.  Urine culture on June 06, 2005, positive for E. coli pan-sensitive.   MEDICATIONS:  1.  Dilantin 100 mg three times daily.  2.  Nicotine 21 mg/24 hours patch daily.  3.  Protonix 40 mg daily.  4.  Lovenox 40 mg daily.  5.  Iron sulfate 325 mg twice daily.  6.  Colace 100 mg one tab twice daily.  7.  Percocet 5/325 one tab every four hours as needed for pain and to be      taken 30 minutes before hydrotherapy.  8.  Ensure 237 ml three times daily.  9.  Flomax 0.8 mg daily.  10. Oxycodone 5 mg, one to two tabs 30 minutes before hydrotherapy.  11. Cipro 500 mg twice daily until June 19, 2005.   HOSPITAL COURSE:  The patient was admitted, on June 06, 2005, after  coming to the emergency department with generalized malaise.  The patient  was discharged back in October with multiple decubitus ulcers and peripheral  artery disease at which time he was recommended to get bilateral AKA,  however, he refused and was discharged home.  At the time of admission, the  patient had changed his mind and wanted to go ahead with the surgery.  He  also had a urinary tract infection at admit and was treated appropriately.  The patient had some long term  anemia that was thought to be either iron  deficient and/or related to chronic disease.  The patient's multiple  decubitus ulcers were managed by the wound management team and included  frequent dressing changes and hydrotherapy daily.  We consulted [facility] for the patient's first BKA of his right leg was performed  by Dr. Gerrit Friends.  Dr. Gerrit Friends decided that he did not need to perform the  bilateral AKAs and we had consulted Dr. Darrick Penna with CVTS.  Dr. Darrick Penna  performed the bilateral AKAs on June 13, 2005, as it was the earliest he  could perform the surgery.  The patient received one unit of packed red  blood cells in the OR.  The patient was stable post-op.  The pain was well  controlled.   FOLLOWUP:  1.  The patient will follow up with Dr. Darrick Penna on July 13, 2005 at 10:30.  2.  The patient will follow up with Dr. Morrie Sheldon as soon as possible.  At the      time of this discharge summary, the patient's followup appointment had      not been scheduled yet.  The patient can have this scheduled as soon as      possible [phone] is the phone number for Dr. Morrie Sheldon at the [facility].   SPECIAL INSTRUCTIONS:  1.  The patient will need to have physical therapy with a goal to increase      his mobility especially with transfers.  2.  The patient should also have dressing changes on his decubitus ulcers      three times daily.  3.  He is taking Cipro for his wounds as well for question pseudomonas.  4.  The patient should also have hydrotherapy on his decubitus ulcers daily.  5.  The patient should have dressing changes on his bilateral AKA wounds      once daily.      Angeline Slim, M.D.    ______________________________  Melina Fiddler, MD    AL/MEDQ  D:  06/15/2005  T:  06/15/2005  Job:  [id number]   cc:   Franklyn Lor, MD  Fax: [phone]   Janetta Hora. Fields, MD  [address], [place] [address]   [facility]

## 2010-11-17 NOTE — Assessment & Plan Note (Signed)
[facility]   [name], [name]              ACCOUNT NO.:  [id number]   MEDICAL RECORD NO.:  [id number]      DATE OF BIRTH:  07-23-1945   PHYSICIAN:  Theresia Majors. Tanda Rockers, M.D. VISIT DATE:  02/25/2006                                     OFFICE VISIT   VITAL SIGNS:  Blood pressure is 128/78, respirations 18, pulse rate 75,  temperature 98.6.   PURPOSE OF TODAY'S VISIT:  [name] is a 65 year old bilateral amputee  with a ischial decubitus.  He has the sacral decubitus and the ischial  decubitus.  In the interim, he has been treated with a wound vac which was  discontinued approximately a week ago.  He presents with excessive  malodorous drainage, no fever and no significant pain.   WOUND EXAM:  The wound #1 on the sacrum was photographed and entered into  the wound expert.  This is a full-thickness wound extending down to  periosteum which is covered with healthy granulation. There is a moderate  amount of malodorous drainage.  There is no significant necrosis.  Wound #2  is in the right ischial area.  It is a penetrating wound with a depth down  to the periosteum of the ischium.  There is moderate drainage and malodor.  There was frank necrosis in the center of the wound which was sharply  excised after anesthetizing the internal wound with Lidocaine ointment.  Hemorrhage was controlled with direct pressure.  Debridement was achieved  with a #10 blade.  Pain was 2/10.   DIAGNOSIS:  Cavitary wounds on both the sacrum and the ischium.   MANAGEMENT PLAN & GOAL:  We recommended that we resume the wound vac to both  wounds.  We utilized a silver dressing over the sacrum.  We have discussed  this technique and this recommendation with [facility] and the sniff nurse.  We  will continue to monitor this patient, evaluate this patient on a monthly  basis or we will see him in the interim p.r.n. as indicated by the nurse.           ______________________________  Theresia Majors. Tanda Rockers, M.D.     [name]  D:  02/25/2006  T:  02/25/2006  Job:  [id number]   cc:   Nursing Facility

## 2010-11-17 NOTE — Procedures (Signed)
[facility]  Patient:    [name], [name]                       MRN: [id number] Proc. Date: 10/14/00 Adm. Date:  [id number] Disc. Date: [id number] Attending:  Howell Pringle CC:         Doren Custard, M.D. at [facility]   Procedure Report  PROCEDURE:  Colonoscopy and coagulation of polyps.  SURGEON:  James L. Edwards, M.D.  MEDICATIONS:  Fentanyl 100 mcg and Versed 10 mg IV.  INDICATIONS:  Heme positive stools in a 65 year old.  DESCRIPTION OF PROCEDURE:  The procedure had been explained to the patient and consent obtained.  With the patient in the left lateral decubitus position, the adult video colonoscope was inserted and advanced under direct visualization.  The prep was poor.  There were some areas that were fairly obscured by solid stool with small little pieces of stool.  The scope was withdrawn through the small pieces of apparent stool.  We were unable to see the whole colon, but we were unable to completely clear everything.  We withdrew the scope and the colon carefully examined.  No polyps were seen throughout until the rectum was reached.  There were two small polyps that were seen.  Each was 3-4 mm in diameter.  The polyps were cauterized with hot biopsy forceps.  The scope was withdrawn.  The patient tolerated the procedure well, was maintained on low flow oxygen and pulse oximeter.  ASSESSMENT:  Heme positive stool, possibly due to the small rectal polyps.  PLAN:  Will check pathology report on the polyp.  If adenomatous, going to need follow up colonoscopy.  Will see back in the office in two months for recheck of his stools. DD:  10/14/00 TD:  10/14/00 Job: [id number]

## 2010-11-17 NOTE — H&P (Signed)
NAMECOLBY, [name]:  [id number]   MEDICAL RECORD NO.:  [id number]          PATIENT TYPE:  EMS   LOCATION:  ED                           FACILITY:  [facility]   PHYSICIAN:  Elliot Cousin, M.D.    DATE OF BIRTH:  05/21/1946   DATE OF ADMISSION:  11/03/2006  DATE OF DISCHARGE:                              HISTORY & PHYSICAL   PRIMARY CARE PHYSICIAN:  [name]. Chilton Si, M.D.   PRIMARY NEUROLOGIST:  [name], M.D.   CHIEF COMPLAINT:  Passing blood from suprapubic catheter and fever as  reported at the skilled nursing facility.   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 skilled nursing facility.  According to the nursing facility notes, the patient's suprapubic  catheter was replaced by the nurse at the facility.  It was originally  placed in July of 2007.  Prior to the suprapubic catheter replacement  yesterday, the patient had no complaints of abdominal pain, bladder  pain, fever, or chills.  This morning, however, he developed gross  hematuria  from the suprapubic catheter.  He also complained of  suprapubic pain.  He describes the pain as severe and crampy.  The pain  does not radiate.  He also complains of subjective fever and chills.  He  denies headache, lightheadedness, chest pain, shortness of breath,  nausea, vomiting, and diarrhea.   During the initial evaluation in the emergency department, the patient  was noted to be hypotensive with a blood pressure of 61/40 on arrival.  On exam per the emergency department physician, the suprapubic catheter  was draining gross blood, question evidence of any urine.  His lab data  so far are significant for an urinalysis  that reveals positive urine  nitrite, large leukocyte esterase, and too numerous to count red blood  cells.  His temperature is currently 103.  He is somewhat tachycardic  with a heart rate ranging between 110 and 140 beats per minute.  The  patient will therefore be admitted for further evaluation and  management.   PAST MEDICAL HISTORY:  1. Status post suprapubic catheter and circumcision in July of 2007      secondary to hypospadism.  2. Chronic stage 4 sacral pressure ulcer with a history of      osteomyelitis in 2007 and a history of peritrochanteric abscesses.      The patient is currently followed by wound care physician, Dr.      Tanda Rockers.  3. 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  4. Seizure disorder, on chronic Dilantin therapy.  5. History of head trauma status post surgery, question VP shunt in      1990.  6. History of stroke.  7. Hypertension.  8. Hyperlipidemia.  9. History of anemia secondary to iron deficiency.   MEDICATIONS:  1. Mechanical soft diet.  2. ProPass, one scoop t.i.d.  3. Med-Pass 120 mL q.i.d.  4. Prilosec 20 mg daily.  5. Flomax 0.4 mg, two tablets daily.  6. Senokot-S b.i.d.  7. Multivitamin with iron once daily.  8. Dilantin 100 mg, two capsules (200 mg) b.i.d.  9. Cymbalta 60 mg q.h.s.  10.Oxycodone 10 mg q.h.s.  11.Aspirin 325 mg daily.   ALLERGIES:  NO KNOWN DRUG ALLERGIES.   SOCIAL HISTORY:  The patient is single.  He has three children.  He is a  resident of [facility] nursing facility.  He says that he smokes two  packs of cigarettes per day.  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  He stopped using drugs and  drinking alcohol in 1999.   FAMILY HISTORY:  Both of his parents are deceased.  His father was  murdered by his mother.  The patient has seven brothers and sisters,  health unknown.   PHYSICAL EXAMINATION:  VITAL SIGNS:  Temperature 103 rectally, blood   pressure 109/51 repeated at 73/40, pulse 127, respiratory rate 16,  oxygen saturation 99% on 2 liters of nasal cannula oxygen.  GENERAL:  The patient is a debilitated-appearing 65 year old [place]  American man who is currently lying in bed in no acute distress.  HEENT:  Head is atraumatic, normocephalic.  Pupils are equal, round, and  reactive to light.  Extraocular movements are intact.  Conjunctivae are  clear.  Sclerae are white.  Tympanic membranes not examined.  Nasal  mucosa is slightly dry.  Oropharynx reveals multiple missing teeth.  Mucous membranes are dry.  No posterior exudates or erythema.  NECK:  Supple.  No adenopathy, no thyromegaly, no bruit, no JVD.  LUNGS:  Occasional wheezes; otherwise, decreased breath sounds in the  bases.  Breathing is nonlabored.  HEART:  S1 and S2 with tachycardia.  ABDOMEN:  Mildly obese.  Positive bowel sounds.  Soft, mildly tender and  distended over the suprapubic area.  Suprapubic catheter is draining  gross blood.  No appreciable masses palpated.  GU:  There is an ulcerated open lesion on the posterior surface of his  penis.  His urethra is draining a small amount of blood.  EXTREMITIES:  Bilateral lower extremity AKA with intact scars.  No  breakdown of the skin.  No ulcerations.  SACRUM:  There is a healing stage 3 sacral ulcer that is approximately 2  cm in size.  No active purulent drainage or bleeding at this time.  There is surrounding scar tissue.  NEUROLOGIC:  The patient is alert and oriented x2.  Cranial nerves II-  XII are intact, with possible exception of dysarthria.  Hand grip  bilaterally 5/5.   ADMISSION LABORATORY DATA:  EKG reveals sinus tachycardia with a heart  rate of 119 beats per minute.   Urinalysis:  Urine ketones trace, urine blood large, urine protein  greater than 300, urine nitrite positive, leukocytes large.  Microurine: too numerous to count RBCs and bacteria.  Urine culture pending.  ABG on  4 liters of  nasal cannula oxygen reveals a pH of 7.366, PCO2 of 33, and  PO2 of 130.  WBC 6.7, hemoglobin 12.9, platelets 177.  Blood cultures  pending.  Sodium 140, potassium 3.4, chloride 104, CO2 21, glucose 105,  BUN 23, creatinine 1.7, calcium 9.0.  Lactic acid 8.0.   ASSESSMENT:  1. Fever and hypotension.  Consistent with sepsis/septic shock      secondary to urinary  tract infection.  2. Urinary tract infection with history of suprapubic catheter      replacement on Nov 02, 2006.  The suprapubic catheter was originally      placed in July of 2007 by Dr. Brunilda Payor.  3. Gross hematuria, as above.  4. Sinus tachycardia thought to be secondary to sepsis and/or volume      depletion.  5. Renal insufficiency.  The renal insufficiency may be chronic;      however, will consider acute renal insufficiency secondary to      prerenal azotemia and/or obstructive uropathy.  6. Hypokalemia.  The patient's serum potassium is currently 3.4.   PLAN:  1. The patient will be admitted to the ICU.  2. He has received 1500 mL of normal saline in the emergency      department.  We will give him another bolus of 500 mL.  Consider      adding Levophed or dopamine for blood pressure support.  3. IV vancomycin and Zosyn were given in the emergency department.      Will continue  treatment with these two antibiotics.  4. Consult urology.  Dr. Earlene Plater has been consulted.  5. For further evaluation, we will check a pelvic CT.  We will also      culture his urine and blood.  6. Wound care consult for the sacral ulcer.  7. Tobacco cessation counseling.  Will place a nicotine patch.   Total time spent 1 and 1/2 hours.      Elliot Cousin, M.D.  Electronically Signed     DF/MEDQ  D:  11/03/2006  T:  11/03/2006  Job:  [id number]   cc:   [name]. Chilton Si, M.D.  Fax: [phone]   [name], M.D.  Fax: [phone]
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
## 2010-11-17 NOTE — Op Note (Signed)
[name], [name]:  [id number]   MEDICAL RECORD NO.:  [id number]          PATIENT TYPE:  INP   LOCATION:  6734                         FACILITY:  [facility]   PHYSICIAN:  Currie Paris, M.D.DATE OF BIRTH:  1945-10-05   DATE OF PROCEDURE:  03/31/2005  DATE OF DISCHARGE:                                 OPERATIVE REPORT   PREPROCEDURE DIAGNOSIS:  Ischemic necrosis right lower extremity.   POSTOPERATIVE DIAGNOSIS:  Ischemic necrosis right lower extremity.   PROCEDURE:  Debridement of necrotic tissue, right lower extremity.   SURGEON:  Currie Paris, M.D.   ANESTHESIA:  None   CLINICAL HISTORY:  This patient has multiple pressure ulcers sacrum and both  hips as well as the lateral aspect of both legs, particularly a long segment  with a thick eschar on the right lower extremity. After discussion with the  patient, he was agreeable to having [place] debride this. I went ahead with sharp  debridement with scissors and cut off the entire eschar and some of the dead  tissue underneath. He does have some superficial myonecrosis. It is  impossible to tell just with this how deep ths goes. He does have exposed  tendon which I did not debride at this point in time.   At this point, we will begin local wound care with some wet-to-dry  dressings. This may need further operative debridement, but I am more  inclined to think that an amputation would more likely to be beneficial and  achieve better long-term healing, but we will need to further discuss this  with the patient and his family after we see how local wound care does.      Currie Paris, M.D.  Electronically Signed     CJS/MEDQ  D:  03/31/2005  T:  03/31/2005  Job:  [id number]

## 2010-11-17 NOTE — Op Note (Signed)
NAMEJAMIL, [name] NO.:  [id number]   MEDICAL RECORD NO.:  [id number]          PATIENT TYPE:  EMS   LOCATION:  ED                           FACILITY:  [facility]   PHYSICIAN:  Anselm Pancoast. Weatherly, M.D.DATE OF BIRTH:  [birth year]   DATE OF PROCEDURE:  08/11/2004  DATE OF DISCHARGE:                                 OPERATIVE REPORT   PREOPERATIVE DIAGNOSIS:  Decubitus ulcer, full thickness, right posterior  buttocks, about 3 x 2 inches.   OPERATION:  Excision of full-thickness decubitus ulcer, right buttocks.   ANESTHESIA:  Local.   SURGEON:  Anselm Pancoast. Zachery Dakins, M.D.   LOCATION:  Done in the emergency room.   HISTORY:  [name] is a disabled black male who comes in to the  emergency room with his caretaker, who had noticed an area of boil or  swollen area, probably two weeks ago.  Today, this is a large, full-  thickness necrotic area that certainly looks like a decubitus ulcer.  He has  some little surrounding pustules that he said he is not sure what they are  from.  It does not smell foul but obviously needs debridement and culture.  The patient has had a stroke, but he is able to stand and sit most of the  time in a wheelchair and kind of limited activity.  I am not sure why he  would develop a decubitus ulcer of this size with his degree of ambulation.  He is seen by the family practice clinic over at [facility], but he is here in the  emergency room at [facility].  He is on Medicaid and is disabled and has a  caretaker who assists.   Patient was positioned on the stretcher, and the area was prepped with  Betadine.  Using a 15 scalpel, I just completely debrided this full  thickness circumferential.  The specimen was discarded, and then the  underlying fatty infected tissue was cultured anaerobically.  There was  minimal bleeding, but all of the actual infection or dead tissue was  removed.  One of these little pustule areas was anesthetized, and I  made  little lance in it, but I did not see an actual frank pocket of pus, and  there was nothing to culture on it.   I am going to place him on doxycycline 100 mg b.i.d.  I talked with the  caretaker about changing the dressing starting tomorrow, and they will use  Betadine and saline solution on 4x4s and let him get into the shower, scrub  this area if it is not bleeding and also make sure that he is not lying on  this for any prolonged period of time.  I will need to see him back in the  office on Wednesday.  Hopefully, we can get this to contract and granulate  in without becoming a worse, fourth degree type of decubitus ulcer.  Still a  little confused as to how he can get such a decubitus with being able to  stand, which he can, but he is a little weak.  He appears  to have sensation,  even  though it might not be perfectly normal on that side.     WJW/MEDQ  D:  08/11/2004  T:  08/11/2004  Job:  [id number]

## 2010-11-17 NOTE — Discharge Summary (Signed)
Evan, [name]:  [id number]   MEDICAL RECORD NO.:  [id number]          PATIENT TYPE:  INP   LOCATION:  3014                         FACILITY:  [facility]   PHYSICIAN:  [name], M.D.DATE OF BIRTH:  Jul 28, 1945   DATE OF ADMISSION:  07/24/2005  DATE OF DISCHARGE:  07/30/2005                                 DISCHARGE SUMMARY   ADMIT DIAGNOSES:  1.  Abdominal pain.  2.  Osteomyelitis.  3.  Bilateral trochanteric abscesses.  4.  Stage IV decubitus ulcers.  5.  Malnutrition.  6.  Hypertension.  7.  Hyperlipidemia.  8.  Iron deficiency anemia.  9.  Bilateral above the knee amputations secondary to peripheral vascular      disease.  10. Seizure disorder.  11. Urinary incontinence.   DISCHARGE DIAGNOSES:  1.  Ileus secondary to constipation.  2.  Severe bladder distention.  3.  Osteomyelitis.  4.  Bilateral trochanteric abscesses.  5.  Right gluteal abscess.  6.  Acute renal failure.  7.  Hyperlipidemia.  8.  Hypertension.  9.  Seizure disorder.  10. 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  11. Urinary tract infection.  12. Malnutrition.  13. Iron deficiency anemia.   CONSULTS:  1.  Wound care consultation  2.  Surgery consultation on July 23, 2005  3.  Palliative care consultation on July 26, 2005   PROCEDURES:  1.  Abdominal film on July 23, 2005 that showed ileus secondary to      constipation.  2.  CT scan of the abdomen and pelvis consistent with a right gluteal      abscess, ileus secondary to constipation as well as bladder distention.   HOSPITAL COURSE:  [name] is a 65 year old African-American male known  well to the family practice teaching service.  He had status post bilateral  above the knee amputations secondary to severe peripheral vascular disease.  Also has osteomyelitis of his stage IV decubitus ulcers, bilateral  trochanteric abscesses with a right gluteal abscess who presented  to the  emergency department for severe abdominal pain.  The patient's laboratories  initially were within normal limits except for a creatinine of 2 with acute  renal failure.  The abdominal pain was thought to be secondary to ileus  which was seen on plain film as well as CT scan as well as an over distended  bladder.  Of note, the patient was previously discharged off of Flomax  secondary to low blood pressures.  To further explore the patient's  abdominal examination a rectal examination was performed which was  consistent with hemorrhoids, no stool impaction as well as no boggy  turbinate.  A PSA was also checked which was within normal limits.  The  patient was placed on a bowel regimen consisting of Senna S two tablets p.o.  b.i.d. as well as MiraLax and Theravac suppositories producing bowel  movements more than two to three per day.  A Foley catheter was placed for  the over distended bladder and yielded 100 mL of urine.  This was likely  thought to be post  obstructive etiology for the acute renal retention  secondary to the patient being off Flomax.  Flomax was restarted on January  24 and after a seven-day course on January 31 the Foley catheter should be  taken out at the skilled nursing facility.  If the patient does not void  within six to eight hours after the Foley is taken out he is to have a  bladder scan to check for urinary retention.   From an infectious disease standpoint for the patient's osteomyelitis  patient does have a PICC line in place in his left arm.  He is on day #28 of  Zosyn 3.375 g every six hours as well as vancomycin 1 g every 12 hours.  He  is to complete a 42-day course of this regimen.  Blood cultures have been no  growth to date and the patient has been afebrile since he has been here.  The PICC line should be removed on August 08, 2005 at the skilled nursing  facility.  A surgical consult was obtained as noted above and felt like  drainage of the  right gluteal abscess was not needed at this time.  The  patient does not desire to have surgery at this time either.   For urinary tract infection patient's initial urine was significant for  yeast and he was placed on Diflucan 200 mg IV for a total of five-day  course.  He has no urinary symptoms at this time.   For acute renal failure we think this is likely prerenal in nature but  patient's initial creatinine was above 2 and prior to discharge was down to  0.9 with IV hydration.  Prior to discharge he was taking adequate p.o. and  had a low potassium at 3.3.  This was replaced with 40 mg of K-Dur p.o.  We  recommend checking a basic metabolic panel when he follows up with his  primary care physician as well as to check a magnesium level.   For the patient's anemia his hemoglobin prior to discharge was 8.7.  It was  greater than 9 at admission with an MCV of 89.2.  Hemoccult check of the  patient's stools were negative x2.  However, a ferritin level was obtained  which was 1187.  This is consistent with anemia of chronic disease.  We will  continue ferrous sulfate 325 mg p.o. b.i.d.   For the patient's seizure disorder patient has been stable on Dilantin 100  mg t.i.d.  This level is stable according to the patient's albumin of 1.5.  Continue the current dose.   For hypercholesterolemia the patient is to continue on Zocor 40 mg by mouth  daily.   Fluid, electrolyte, and nutrition:  We encouraged a p.o. ad lib diet consist  of low cholesterol foods.  The patient did receive Benefiber 6 g p.o. t.i.d.  as well as Ensure supplementation.  He is to continue zinc and vitamin C to  promote wound healing.   The patient was placed on prophylactic Protonix 40 mg p.o. daily.   CONDITION ON DISCHARGE:  Stable.   DISCHARGE MEDICATIONS:  1.  Benefiber 6 g p.o. t.i.d.  2.  Zosyn 3.375 IV q.6h. until August 18, 2005. 3.  Vancomycin 1 g IV q.12h. until August 18, 2005.  4.  Phenytoin  100 mg p.o. t.i.d.  5.  Zocor 40 mg p.o. daily.  6.  Vitamin C 500 mg tablet p.o. daily.  7.  Zinc sulfate 220 mg capsule p.o.  daily.  8.  Flomax 0.4 mg p.o. daily.  9.  Senokot S two tablets p.o. b.i.d.  10. Ensure 237 mL p.o. t.i.d.  11. Oxycodone 5 mg p.o. 30 minutes prior to hydrotherapy.  12. Ferrous sulfate 325 mg p.o. b.i.d.  13. Protonix 40 mg p.o. daily.  14. Nicoderm patch 14 mg every 24 hours.   WOUND CARE:  The patient is to participate in hydrotherapy three times daily  and apply sterile gauze dressings to his stage IV wounds.  He may have  oxycodone 5 mg at least 30 minutes prior to hydrotherapy.   Of note, the patient's PICC line in his left arm should be removed at the  skilled nursing facility on August 18, 2005 as well as to have the patient  remove the Foley catheter on August 01, 2005.  If the patient does not void  after removing the Foley catheter within six to eight hours he is to have a  bladder scan to rule out a postvoid residual.   He is to follow up with his primary care physician, Dr. __________ Para March at  [facility] on August 13, 2005 at 10:30 a.m. and given the  number [phone] to reschedule if needed.  Return to the emergency department  or clinic for inability to urinate, abdominal pain, fever, or any other  concerns.      [name], M.D.    ______________________________  [name], M.D.    MR/MEDQ  D:  07/30/2005  T:  07/30/2005  Job:  [id number]

## 2010-11-17 NOTE — Op Note (Signed)
[name], [name]              ACCOUNT NO.:  [id number]   MEDICAL RECORD NO.:  [id number]          PATIENT TYPE:  INP   LOCATION:  5738                         FACILITY:  [facility]   PHYSICIAN:  Janetta Hora. Fields, MD  DATE OF BIRTH:  August 24, 1945   DATE OF PROCEDURE:  06/13/2005  DATE OF DISCHARGE:                                 OPERATIVE REPORT   PROCEDURE:  Bilateral above-knee amputation.   PREOPERATIVE DIAGNOSES:  1.  Nonhealing right below-knee amputation.  2.  Large left leg and heel decubitus ulcer.   POSTOPERATIVE DIAGNOSIS:  1.  Nonhealing right below-knee amputation.  2.  Large left leg and heel decubitus ulcer.   ANESTHESIA:  General.   ASSISTANT:  Coral Ceo, PA   INDICATIONS:  The patient is a 65 year old male who previously had a right  below-knee amputation 6 weeks ago. This has not healed. It is severely  contracted at approximately 90 degrees. Additionally, he has a large open  wound on his left calf and heel. This is decubitus in nature. He has severe  contractures of his left lower extremity as well. The patient was explained  the risks, benefits and possible complications of the procedure prior to  proceeding.   OPERATIVE FINDINGS:  Well bleeding tissues right and left above-knee  amputation.   OPERATIVE DETAILS:  After obtaining informed consent, the patient was taken  to the operating room. The patient was placed in the supine position on the  operating table. After induction of general anesthesia with endotracheal  intubation, both lower extremities were prepped and draped in the usual  sterile fashion. Next, a circumferential incision was made in the right leg,  just above the knee. Incision was carried down through the subcutaneous  tissues. The fascia was incised with cautery. All muscles were taken down  with cautery. Superficial femoral artery and vein were dissected free  circumferentially, divided between clamps and suture ligated. Sciatic  nerve  was divided with cautery. Next, the femur was transected approximately 5 cm  above the skin incision line, and the leg passed off the table as a  specimen. The wound was thoroughly irrigated with normal saline solution and  hemostasis was obtained. The fascial edges were reapproximated with  interrupted 2-0 Vicryl sutures, after beveling the femur anteriorly. The  subcutaneous tissues were reapproximated using a running 3-0 Vicryl suture.  Skin was closed with staples. Next, attention was turned to the left leg.  Similar incision was made in circumferential fashion on the left leg, just  above the knee. Incision was carried down through the subcutaneous tissues.  The muscles were again divided with cautery. The superficial femoral artery  and vein were dissected free circumferentially, divided between clamps and  suture ligated. The sciatic nerve was dissected free and divided with  cautery. The femur again was divided approximately 5 cm above the skin  incision line. The wound was thoroughly irrigated with normal saline  solution. The femur was beveled anteriorly. Fascial edges were  reapproximated using interrupted 2-0 Vicryl sutures. The subcutaneous  tissues were reapproximated using a running 3-0 Vicryl suture. Skin  was  closed with  staples. The patient tolerated the procedure well and there were no  complications. Dry sterile dressings were applied to both lower extremities.  The instrument, sponge and needle count was correct at the end of the case.  The patient was taken to the recovery room in stable condition.           ______________________________  Janetta Hora Fields, MD     CEF/MEDQ  D:  06/13/2005  T:  06/14/2005  Job:  [id number]
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
## 2010-11-17 NOTE — Assessment & Plan Note (Signed)
[facility]   NAMELATASHA, [name]              ACCOUNT NO.:  [id number]   MEDICAL RECORD NO.:  [id number]      DATE OF BIRTH:  [birth year]   PHYSICIAN:  Theresia Majors. Tanda Rockers, M.D. VISIT DATE:  02/12/2006                                     OFFICE VISIT   SUBJECTIVE:  Mr. [name] is a 65 year old man who is referred for followup of  stage IV sacral and ischial ulcers.  During the last visit, we treated him  with a foam dressing to promote autolysis.  He represents complaining of  some extensive malodor and also temperature.  He denies pain.  His appetite  remains good.   OBJECTIVE:  The patient remains alert and oriented, in good contact with  reality, and responding appropriately to historical inquiry.   Please refer to the Nurses' Notes.  The patient tacitly feels warm, and the  nurse is currently taking his temperature.  The patient was moved to the  examining table with some effort on the part of the staff as well as the  attendant.   WOUND EXAM:  The dressing was taken down and noted to be extremely soft and  malodorous.  There is persistent necrosis in the ischial ulcer.  It was too  painful for debridement.  The sacral ulcer did in fact show evidence of some  autolysis and was mechanically debrided.   ASSESSMENT:  Penetrating ischial stage IV decubitus and a stage IV sacral  decubitus.   PLAN:  We are recommending that this patient be considered for a wound VAC  therapy.  We are recommending this to be facilitated as of today.  With  regard to his current dressings, the wounds have been cleansed with  antiseptic irrigation and have been packed with antiseptic gel and 4 x 4  gauze.  We will agree to follow the patient up at monthly intervals to  assess his response to the wound VAC therapy and to perform debridements as  directed by the home health agency who will coordination the wound VAC  therapy.           ______________________________  Theresia Majors. Tanda Rockers, M.D.     [name]  D:  02/12/2006  T:  02/12/2006  Job:  [id number]

## 2010-11-17 NOTE — Assessment & Plan Note (Signed)
[facility]   NAMEJAKSON, DELPILAR              ACCOUNT NO.:  [id number]   MEDICAL RECORD NO.:  [id number]      DATE OF BIRTH:  09-12-1945   PHYSICIAN:  Theresia Majors. Tanda Rockers, M.D.      VISIT DATE:                                   OFFICE VISIT   VITAL SIGNS:  Blood pressure 100/62, respirations 16, pulse rate 67, and  he is afebrile.   PURPOSE OF TODAY'S VISIT:  Mr. Mastrangelo is a 65 year old man who we have  followed for multiple stage 4 sacral and buttock decubiti.  In the  interim, he has been treated with moist,-moist dressings and daily  Dakin's irrigations.  He reports there has been no fever and no  excessive drainage and no pain.   WOUND EXAM:  Inspection of his wounds shows that both wounds numbers 1  and 2 are 100% granulated with evidence of advance in epithelium from  the circumferences.  The measurements were taken and entered into the  Wound Expert.  Both measurements show that there has been significant  contraction of the wounds.   DIAGNOSIS:  Clinical response to local care.   MANAGEMENT PLAN & GOAL:  We are recommending that the nursing staff  continue the daily saline moist-moist dressings and decrease the Dakin's  washes to twice weekly.  We will reevaluate the patient in one month  p.r.n.           ______________________________  Theresia Majors. Tanda Rockers, M.D.     Cephus Slater  D:  06/19/2006  T:  06/19/2006  Job:  [id number]   cc:   Attn:  Dr. Sharyne Peach at [facility]
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
## 2010-11-17 NOTE — Op Note (Signed)
[name], [name]:  [id number]   MEDICAL RECORD NO.:  [id number]          PATIENT TYPE:  INP   LOCATION:  6707                         FACILITY:  [facility]   PHYSICIAN:  Velora Heckler, MD      DATE OF BIRTH:  1946-05-30   DATE OF PROCEDURE:  04/04/2005  DATE OF DISCHARGE:                                 OPERATIVE REPORT   PREOPERATIVE DIAGNOSIS:  Osteomyelitis right lower extremity, nonhealing  wounds.   POSTOP DIAGNOSIS:  Osteomyelitis right lower extremity, nonhealing wounds.   PROCEDURE:  Right below-knee amputation.   SURGEON:  Velora Heckler, MD   ANESTHESIA:  General per Dr. Claybon Jabs.   ESTIMATED BLOOD LOSS:  Minimal.   PREPARATION:  Betadine.   COMPLICATIONS:  None.   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 The patient is confined to bed following stroke. He has  lower extremity contractures. He has developed chronic decubitus ulcers  involving the sacrum, hips, and bilateral lower extremities and ankles.  Diagnostic studies indicated osteomyelitis in the right lower extremity at  the foot and ankle. He has nonhealing wounds on the foot, ankle and lateral  calf. The patient has contractures at the knee.  General surgery was  requested for below-knee amputation for treatment of nonhealing wounds,  right lower extremity and osteomyelitis of the foot and ankle.   BODY OF REPORT:  The procedure was done in OR #7 at [facility]. The patient is brought to the operating room, placed in supine  position on the operating room table. Following administration of general  anesthesia, the patient is prepped and draped in usual strict aseptic  fashion. After ascertaining that an adequate level of anesthesia been  obtained, a transverse incision was made at the level of the tibial tuber.  This was carried lateral laterally and medially on the calf. A skin flap was  developed  extending the incision towards the ankle and then crossing the  posterior aspect of the calf. The electrocautery was used for hemostasis.  Muscle bellies were divided with electrocautery. Neurovascular bundles were  divided between Kelly clamps and suture ligated with 2-0 silk suture  ligatures and divided. The tibia was transected with the Gigli saw after  elevating the periosteum circumferentially. It was used to smooth the edges  of the tibia. A high division of the fibula was performed with a bone  cutter.  Good hemostasis was obtained in the myocutaneous posterior flap.  Using interrupted 0 Vicryl and 2-0 Vicryl sutures, the flap was secured over  the cut end of the tibia to the surrounding tissues and fascia. Good closure  was performed with a good myocutaneous flap overlying the bone. Good  hemostasis. There is no evidence of  ischemia. Skin was closed was closely spaced stainless steel staples.  Adaptic followed by Kerlix followed by Ace wraps was placed as dressing. The  patient was awakened from anesthesia and brought to the recovery room in  stable condition. The patient tolerated the procedure well.  Velora Heckler, MD  Electronically Signed     TMG/MEDQ  D:  04/04/2005  T:  04/04/2005  Job:  [id number]   cc:   Morley Kos, M.D.  Fax: [phone]

## 2010-11-17 NOTE — Consult Note (Signed)
[name], DELAMATER NO.:  [id number]   MEDICAL RECORD NO.:  [id number]          PATIENT TYPE:  INP   LOCATION:  5153                         FACILITY:  [facility]   PHYSICIAN:  Clois Comber. [name], MD       DATE OF BIRTH:  06-Jan-1946   DATE OF CONSULTATION:  07/26/2005  DATE OF DISCHARGE:                                   CONSULTATION   PALLIATIVE MEDICINE CONSULT:   REASON FOR CONSULTATION:  Palliative medicine was asked to assist in helping  this patient determine goals of care.  The chart is reviewed, patient  discussed with covering resident physician and with nurse, the patient  interviewed and examined.   IMPRESSION:  1.  Significant cognitive deficits, most likely secondary to developmental      disability, possibly exacerbated by head trauma in 1990.  However, the      patient does have the capacity to understand death and to indicate his      wishes to remain alive and accept vigorous treatment for that goal.  2.  The patient wishes treatment to keep him alive, even though he would      like to be home.  He accepts treatments his physicians say are necessary      to sustain life.  Although he initially states his wish is to go home      and to refuse surgery, when it is explained that these would potentially      result in his death, he indicates he would prefer to have the treatments      if lack thereof increases his risk of death significantly.  3.  The patient wishes to have cardiopulmonary resuscitation and would want      a feeding tube if needed to sustain his life.  4.  Serious medical problems, including abdominal pain with uncertain cause      at present, severe stage IV pressure sores, hypoalbuminemia, anemia of      chronic disease, status post traumatic brain injury, seizure disorder,      hypertension, hyperlipidemia, and peripheral vascular disease with      bilateral above knee amputations in 2006.   RECOMMENDATIONS:  1.  A palliative approach  does not appear to be in keeping with the      patient's wishes, so further diagnostic and treatment efforts as needed      to prolong his life are appropriate.  2.  When discussing options with the patient, would use simple words and      concepts and then test the patient's understanding by asking the same      question in more than one way or asking the patient to express meaning      back to you.  For decisions that affect the patient's potential of      survival, simple explanation of same is appropriate.  3.  Continue full code, use feeding tube if needed.   HISTORY OF PRESENT ILLNESS:  The patient is a 65 year old man who was  admitted to the emergency room from the nursing home on July 23, 2005  because of abdominal pain.  This was subsequently felt to be secondary to  urinary retention from a plugged Foley catheter as well as possibly  secondary to constipation.  He was found to have elevated creatinine of 2.7  which has subsequently come down to 1.8.  This was felt to be  postobstructive renal failure.  The patient also has been diagnosed with a  UTI, most recently with positive Candida culture.   The patient had been in the hospital several times recently.  From September  28 through April 13, 2005, he was diagnosed with osteomyelitis of the  right leg and left knee and calcaneus.  He was hospitalized December 7  through June 15, 2005 for bilateral above knee amputations.  Again,  January 4 through July 11, 2005 with osteomyelitis of the sacrum, coccyx,  and right ischium.  From that hospitalization, he was discharged to a  nursing facility from which he was admitted for this hospitalization.  He  has continued to have severe pressure wounds, with a large stage IV  decubitus in the sacrum, roughly 15 x 15 cm, as well as stage IV ulcers in  the right hip and right ischium.  He had previously had positive MRSA  culture of a buttock wound.   This hospitalization  also showed a chronic disease-type anemia.  H&H most  recently 8.1 and 24.0.  He also has severe hypoalbuminemia of 1.3.  He  continues to have some abdominal discomfort, but this has improved  substantially.   He has told his physician's that he would not accept anymore surgery and  that he would like to go home.  Because of this and his severe course, the  question of whether a palliative medicine approach is appropriate has been  raised.   PAST MEDICAL HISTORY:  1.  As above.  2.  The patient had a traumatic head injury in 1990, resulting in      neurosurgery with subsequent plate placement.  The patient has been      disabled since that time.  3.  Seizure disorder, presumably secondary to his TBI.  4.  Hypertension.  5.  Hyperlipidemia.  6.  Peripheral vascular disease with previous bilateral AK amputations, as      noted.   SOCIAL HISTORY:  The patient states that he never married.  He completed  fifth grade.  He states he has two sons and one daughter but has no contact  with them.  He lives in [place], where three sisters and two brothers  also live.  The patient says he only has contact with one sister, Fredirick Maudlin, wife of John Little.  The patient says he lived alone in an  apartment prior to his most recent illnesses.  He previously worked as a  Set designer.  He also previously smoked two packs a day and states that  he used to drink a fair bit of alcohol.  He has lived on disability  payments.   Of note is that the patient apparently grew up in an orphanage after his  father killed his mother when the patient was 54.   FAMILY HISTORY:  Noncontributory.   ALLERGIES:  NO KNOWN DRUG ALLERGIES.   MEDICATIONS:  1.  Ascorbic acid 500 mg p.o. b.i.d.  2.  Enoxaparin 40 mg subcutaneously q.24h.  3.  Fluconazole 200 mg IV daily.  4.  Multivitamin one p.o. daily.  5.  Nicotine patch 21 mg daily. 6.  Pantoprazole 40 mg p.o. daily.  7.  Phenytoin 100 mg p.o.  t.i.d.  8.  Piperacillin/tazobactam 3.375 g IV q.8h.  9.  Senokot one tablet p.o. q.h.s.  10. Simvastatin 40 mg p.o. at 1800.  11. Tamsulosin 0.4 mg p.o. daily.  12. Vancomycin 1000 mg IV q.24h.   REVIEW OF SYSTEMS:  The patient denies current headaches or dizziness,  visual disturbance, hearing problems, problems with swallowing (has  dentures, but they are at home, so has soft foods).  He denies shortness of  breath, cough, chest pain, palpitations.  He says he continues with some  abdominal pain, although it is much less than previous.  He denies any other  pains.  He says that his spirits are pretty good.   PHYSICAL EXAMINATION:  GENERAL:  65 year old African American male sitting  up in bed, alert, carrying on a conversation that is very difficult to  understand because of moderately severe stuttering and probably because of  lack of his dentures.  However, with patience and repetition of his  statements to assure accuracy, I am able to understand what he is  communicating.  HEENT:  Head has deformity in the left parietal region, not severe.  No sign  of acute trauma.  Eyes are without discharge or evidence of infection.  Pupils are equal and round.  Ears are without deformity or discharge.  Hearing is grossly acute.  Mouth is edentulous, except for one molar on each  side of the lower jaw.  No sign of inflammation, although the tongue has  white coating.  NECK:  Without JVD or adenopathy.  LUNGS:  Clear to auscultation.  Good air movement.  HEART:  Rhythm is regular.  No murmur heard.  ABDOMEN:  Hyperactive bowel sounds.  A 6-cm or so scar present, left upper  quadrant (patient cannot say what that is from).  Moderate tenderness, left  lower quadrant, with mild rebound.  Question of mild guarding.  Mild  tenderness in the remainder of the abdomen, with minimal rebound tenderness.  GENITALIA:  Foley catheter in place, draining amber urine.  EXTREMITIES:  Bilateral AK  amputations with well-healed stumps.  SKIN:  The patient has pressure sores, as noted.  These are not reexamined.  MENTAL STATUS:  The patient is alert and cooperative.  He shows a good  ability to pay attention to the questions and answers all questions with  appropriate answers, although somewhat simple.  He shows no evidence of  thought disorder.  Thought content is relatively simple but is appropriate  to his current situation and level of intellect.  Cognitive testing using  the short orientation memory concentration test shows quite severe cognitive  inability.  He is unaware of the year, unable to count from 20 down to 1,  name the months backwards, or to recall more than one out of five items on  the memory phrase.  Mood is generally positive.  Affect is congruent, and  the patient jokes some during the interview.  There is no sign of hallucinations.  Judgment appears to be limited, but the patient is able to  express some firmly held beliefs.   DISCUSSION OF PATIENT GOALS:  The patient clearly indicates that the wants  to remain alive.  He does not express and understanding of the severity of  his illness, but he states that if his doctors say that he has to have  surgery to remain alive, he would accept it.  He also indicates that he  would like to go home, but if he needs to go to a nursing facility or stay  in the hospital in order to remain alive, he wants to do that.  When asked  about the function of the heart, he understood that it was necessary for him  to live.  He was aware that if a patients heart stopped, that the patient  would die and that this applied to him.  He also recalled seeing CPR done on  television, and he would want CPR done to him even if it was not likely to  be successful.  When asked why this was the case, he explained he wanted to  stay here to see what goes on.  Further, questioning about whether or not he  would accept a gastrostomy tube to feed him  if he was unable to eat, the  patient said he definitely would want that in order to remain alive as long  as possible.  He indicated that the he was very religious and he felt  comfortable in his relationship with God, but that he wanted to stay on  earth as long as he could.   The patient also indicated that of all of his siblings, he would want only  one sibling, Kathie Rhodes Little, to speak for him in terms of decision making if  he could not speak for himself.   DISCUSSION:  This is a difficult situation, but it appears fairly clear at  this time that the patient does want vigorous treatment of his disease  processes.  Although he has had a marked downward course and indeed this may  continue, it is reasonable to treat him as you wound any other person in  similar circumstances who wished to remain alive as long as possible.   Thank you for the opportunity to participate in the care of this unfortunate  gentleman.  Please call me at [phone] if I can be of any further  assistance.      Clois Comber. [name], MD  Electronically Signed     NKH/MEDQ  D:  07/26/2005  T:  07/27/2005  Job:  [id number]

## 2010-11-17 NOTE — H&P (Signed)
[name], [name]:  [id number]   MEDICAL RECORD NO.:  [id number]          PATIENT TYPE:  EMS   LOCATION:  ED                           FACILITY:  [facility]   PHYSICIAN:  Nelma Rothman, MD   DATE OF BIRTH:  May 31, 1946   DATE OF ADMISSION:  07/23/2005  DATE OF DISCHARGE:                                HISTORY & PHYSICAL   PRIMARY CARE PHYSICIAN:  Unassigned   CHIEF COMPLAINT:  Abdominal pain.   HISTORY OF PRESENT ILLNESS:  The patient is a 65 year old male who currently  lives at a nursing home who was brought to [facility] emergency department  by EMS after the onset of abdominal pain this afternoon. He states that he  has been having bowel movements regularly, the last one being the day prior  to admission. He is unaware of any bright red blood per rectum or melena. He  denies any diarrhea, nausea, or vomiting. He also denies any fevers or  chills. He has otherwise been feeling fine. An acute abdominal series in the  emergency department showed an adynamic ileus and constipation. He was  evaluated by surgery, Dr. Zachery Dakins, who recommended an abdominal CT which  showed a markedly distended bladder. His Foley catheter was replaced with  greater than 1 liter of urine returned. He is now feeling better.  He has  also had a bowel movement in the emergency department. He has also received  imipenem 500 milligrams IV in the ER.   PAST MEDICAL HISTORY:  1.  Status post bilateral above-the-knee amputations for severe peripheral      vascular disease.  2.  Seizure disorder.  3.  Hyperlipidemia.  4.  Hypertension.  5.  Osteomyelitis of the sacrum, coccyx, and ischium.  6.  Bilateral peritrochanteric abscesses.   ALLERGIES:  No known drug allergies.   MEDICATIONS:  1.  Vancomycin 1 gram IV q.12 h x6 weeks from January10, 2007.  2.  Zosyn 3.375 grams IV q.6h. x6 weeks from [place], 2007.  3.  Dilantin 100 milligrams p.o. t.i.d.  4.  Vitamin C 500  milligrams p.o. b.i.d.  5.  Beneprotein powder 6 grams p.o. t.i.d.  6.  Ensure 1 can t.i.d.  7.  Ferrous sulfate 325 milligrams p.o. b.i.d.  8.  Lovenox 40 milligrams subcu q.24 h.  9.  Multivitamin 1 tablet p.o. daily.  10. Nicoderm patch 21 milligrams transdermal daily.  11. Protonix 40 milligrams p.o. daily.  12. Zocor 40 milligrams p.o. daily.  13. Zinc 220 milligrams p.o. daily.   SOCIAL HISTORY:  He lives in a nursing home. He has never been married. He  has 3 children, 2 sons and a daughter, who live in the area. He does have a  smoking history of approximately one to two packs of cigarettes per day,  although he states that he has not been smoking recently. He denies any  alcohol or IV drug use.   FAMILY HISTORY:  He comes from a large family and his mother died when he  was younger of trauma.   REVIEW OF SYSTEMS:  As per  history of present illness; otherwise, a 10-point  review of systems is negative.   PHYSICAL EXAMINATION:  VITAL SIGNS:  Temperature on arrival was 100.1 (which  is now 97.5), blood pressure ranged from 112/70 to 83/58. Pulse is currently  94, respiration rate 20, oxygen saturation 96% on room air.  GENERAL:  He is in no apparent distress.  HEENT: His mucous membranes are moist. There is no exudate.  NECK:  Supple. There is no lymphadenopathy. No jugular venous distension.  HEART:  Regular rate and rhythm with a soft 1/6 systolic murmur heard  loudest at the left upper sternal border.  LUNGS:  Clear to auscultation bilaterally.  ABDOMEN:  Slightly distended with some tenderness to palpation diffusely but  more prominently in the suprapubic area. He has quiet bowel sounds.  EXTREMITIES:  He is status post bilateral above-knee amputations.  The scars  are well-healed and intact. He does have sacral pitting edema.  BACK:  The patient was rolled in the right lateral decubitus position with  the help of a nurse's aide to evaluate his sacral decubiti ulcers.   Currently, there is a dressing over his sacral decubiti which was not  removed by me as the site is contaminated with feces and the dressing is  currently intact; so as not to contaminate the wound, I have not remove the  dressing at this time. There are 2 exposed areas, each approximately 2 cm in  diameter over bilateral ischia which are consistent with a stage III  decubitus ulcer.   LABORATORY DATA:  Sodium 141, potassium 3.8, chloride 105, bicarbonate 27,  BUN 26, creatinine 2.7, glucose 148.  Calcium 8.6, total protein 6.3,  albumin 1.5, AST 28, ALT 31, alkaline phosphatase 143, total bilirubin 0.8,  lipase 16. White blood cell count 26.9 with 87% neutrophils and  hypersegmented polys.  Hemoglobin 9.7, hematocrit 28.4, platelet count 324,  MCV of 87. Urinalysis demonstrates cloudy urine with a large amount of  hemoglobin, as well as a large amount of leukocyte esterase, rare bacteria,  and many yeast.  There were 11-20 white blood cells per high power field and  0-2 red blood cells per high power field. Acute abdominal series  demonstrated an adynamic ileus, as well as constipation.  CT of the abdomen  and pelvis without contrast demonstrated anasarca, as well as an ileus and  constipation, as well as a markedly distended bladder despite the presence  of a Foley.  There was a question of a right gluteus muscle abscess which  could not be further evaluated secondary to lack of intravenous contrast.  There was no hydronephrosis.   ASSESSMENT/PLAN:  1.  Abdominal pain. Likely secondary to bladder distension, obstructed      Foley, and cystitis. Constipation has improved, as the patient has      already had a bowel movement in the emergency department. We will repeat      abdominal films in the morning, but I suspect his ileus, too, will      improve with resolution of bladder distension. We will start a clear     liquid diet and advance as tolerated.  2.  Acute renal failure. This is  likely post obstructive in nature. His      creatinine was 0.5 on January15,2007 per records accompanying the      patient. There may also be an element of dehydration or acute tubular      necrosis, as well, with probable urosepsis. We will gently hydrate  him      with normal saline and recheck a basic metabolic profile in the morning.      For now, will need to renal dose antibiotics.  3.  Pyuria. This likely represents a urinary tract infection which is      concerning, as the patient is on vancomycin and Zosyn. We will change      Zosyn to imipenem for the time being. Also, the presence of yeast raises      concern for candiduria. We will start fluconazole and follow up a urine      culture.  4.  Osteomyelitis. Will continue antibiotics as above, namely vancomycin and      imipenem for the time being, and also consult our wound management team      for recommendations while he is here in the hospital. It is likely that      his antibiotics can be changed back to vancomycin and Zosyn once his      urine culture returns and any infection is adequately treated.  5.  Anemia. This is likely anemia of chronic disease, although the presence      of hyper-segmented raises the question of B12 deficiency so we will      check a B12 level in the morning.  6.  Seizure disorder. Will check a free Dilantin level in the morning given      his acute renal failure, but I suspect we will be able to continue his      home dose if his renal function improves.  7.  Protein gap.  This is likely secondary to a polyclonal increase in IgG      in the setting of active infection. His protein gap was normal with lab      check last week.  8.  Nutrition. He is markedly hypoalbuminemia. We will continue Ensure      supplements, as well as vitamin C and zinc tablets to facilitate wound      healing.      Nelma Rothman, MD  Electronically Signed     RAR/MEDQ  D:  07/24/2005  T:  07/24/2005  Job:   [id number]

## 2010-11-17 NOTE — Discharge Summary (Signed)
[name], AMBLE NO.:  [id number]   MEDICAL RECORD NO.:  [id number]          PATIENT TYPE:  INP   LOCATION:  1609                         FACILITY:  [facility]   PHYSICIAN:  [name], M.D.   DATE OF BIRTH:  Mar 28, 1946   DATE OF ADMISSION:  11/03/2006  DATE OF DISCHARGE:  11/11/2006                               DISCHARGE SUMMARY   DISCHARGE DATE:  Nov 11, 2006, pending bed availability.   PRIMARY CARE PHYSICIAN:  [name]. Chilton Si, M.D.   PRIMARY UROLOGIST:  [name], M.D.   DISCHARGE DIAGNOSES:  1. Sepsis with septic shock secondary to Proteus mirabilis,      Enterococcus, Providencia stuartii, and diphtheroid.  2. Anemia of chronic disease status post 1 units packed red blood      cells.  3. Seizure disorder.  4. Decubitus ulcer to sacrum.  5. Acute renal failure, resolved.  6. Heparin-induced thrombocytopenia, resolving.  7. Hyperglycemia and hypoglycemia.  8. Hypokalemia.  9. Hypomagnesemia.  10.Pulmonary edema and small amount of intra-abdominal ascites.   DISCHARGE MEDICATIONS:  1. Rocephin 1 g IV q. 24 h for full course of 14 days of treatment      which will be completed on Nov 16, 2006.  2. Colace 100 mg b.i.d.  3. Cymbalta 60 mg nightly.  4. Protonix 40 mg daily.  5. Dilantin 200 mg b.i.d.  6. Vancomycin 1 g q. 12 h to complete a full 14-day course of      treatment on Nov 16, 2006.  7. Propass 1 scoop 3 times a day.  8. Medpass 120 mL 4 times a day.  9. Flomax 0.4 mg 2 tablets daily.  10.Multivitamins with iron daily.  11.Oxycodone 10 mg nightly.  12.Aspirin 325 mg daily.   CONSULTATIONS:  1. Ronald L. Earlene Plater, M.D., urology.  2. Gailen Shelter, MD, pulmonary critical care medicine.  3. Speech therapy.  4. Wound care nurse.   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  The patient  was also hypotensive on initial evaluation with blood pressure 61/40.  He was admitted for further evaluation and treatment of sepsis.  For  full details of the HPI, please see the dictated report done by Dr.  Sherrie Mustache.   PROCEDURES AND DIAGNOSTIC STUDIES:  1. Chest x-ray on Nov 03, 2006, showed no active disease.  2. CT scan of the abdomen and pelvis on Nov 03, 2006, showed a small      amount of ascites, no abdominal abscess identified.  There was a      fecal impaction, and prostatitis could not be completely excluded      as there was some prominence of the mucosa in the rectum.  There      was a sclerotic right inferior pelvic ramus.  The suprapubic  catheter was noted.  There was again noted a small amount of      ascites.  3. Chest x-ray on Nov 04, 2006, status post placement of peripherally      inserted central catheter showed the line appropriate with position      without a pneumothorax.  There was developing bibasilar linear      opacity likely representing atelectasis.  4. Repeat chest x-ray Nov 05, 2006, showed increasing pleural effusions      and airspace disease, left greater than right.  5. Chest x-ray on Nov 07, 2006, showed interval increase in bilateral      effusion, bibasilar atelectasis. Pulmonary edema was present.  6. Discharge laboratory values:  Sodium 147, potassium 3.5, chloride      118, bicarb 27, BUN 2, creatinine 0.62, glucose 92.  White blood      cell count 12, hemoglobin 7.9 (receiving 1 units procedure, risks,      and benefits), hematocrit 23.3, and platelet count 130.   HOSPITAL COURSE BY PROBLEM:  #1.  SEPSIS WITH SEPTIC SHOCK:  The patient was admitted with  hypotension and was found to be in acute renal failure, the likely  source felt to be urinary.  A pulmonary critical care  consultation was  rapidly obtained, and the patient was empirically placed on vancomycin  and Cipro as well as Zosyn.  His central venous pressure was monitored,  and he was aggressively volume resuscitated.  He did require the  addition of norepinephrine for blood pressure support.  His oximetry was  measured, and he was monitored closely in the ICU until the sepsis  resolved.  He was pan cultured.  Urine culture subsequently grew out  Proteus mirabilis.  One of two blood cultures was polymicrobial and had  Proteus, Enterococcus, diphtheroids, and MRSA.  Given the severity of  his shock, it was felt that he would need a full 14-day course of  treatment even though only one of the blood cultures was positive.  His  antibiotic coverage was narrowed to vancomycin and Rocephin, and he has  currently completed 8 out of a planned course of 14 days of therapy.  The patient does have a peripherally inserted central catheter in place  to help with IV access and, therefore, can be discharged to a skilled  nursing home for ongoing treatment with antibiotics.   #2.  ANEMIA OF CHRONIC DISEASE:  The patient has known anemia of chronic  disease.  Hemoglobin was relatively stable but dipped down to 7.9 on Nov 10, 2006, and he was, therefore, given 1 unit of packed red blood cells.   #3.  SEIZURE DISORDER:  The patient's seizure disorder remained stable  on Dilantin throughout the course of his hospitalization.   #4.  DECUBITUS ULCERS:  The patient does have extensive sacral Stage III  decubitus ulcer, approximately 1 to 1.5 cm.  Aggressive wound care was  instituted while the patient was hospitalized, and he will need close  followup for ongoing monitoring of his skin as he remains at extremely  high risk for further breakdown.   #5.  ACUTE RENAL FAILURE:  The patient's renal failure was secondary to  hypotension and shock.  His creatinine has returned to his baseline.  #6.  HEPARIN-INDUCED  THROMBOCYTOPENIA:  The patient developed a profound  thrombocytopenia while being treated with Lovenox for DVT prophylaxis.  All heparin-containing products were discontinued, and the patient's  platelet count is gradually recovering.  He should  avoid all use of  heparin in the future, and this should be listed on his chart as an  allergy.   #7.  HYPERGLYCEMIA AND HYPOGLYCEMIA:  The patient did have some  hyperglycemia as well as hypoglycemia while on the ICU hyperglycemia  protocol.  We continued to monitor his sugars closely and used low-dose  sliding scale insulin to address hyperglycemia.   #8.  HYPOKALEMIA AND HYPOMAGNESEMIA:  The patient was actively repleted  during the course of his hospitalization.   DISPOSITION:  The patient will likely be stable for discharge to his  skilled nursing home on Nov 11, 2006.  He should complete his course of  antibiotics as noted under Discharge Medication list.  He should  continue with a mechanical soft diet and have ongoing wound care to his  sacrum.  He should follow up with his primary care physician early in  the week.      [name], M.D.  Electronically Signed     CR/MEDQ  D:  11/10/2006  T:  11/10/2006  Job:  [id number]   cc:   [name] Chilton Si, M.D.  Fax: [phone]   [name], M.D.  Fax: [phone]

## 2010-11-17 NOTE — Discharge Summary (Signed)
NAMEHEITH, [name] NO.:  [id number]   MEDICAL RECORD NO.:  [id number]          PATIENT TYPE:  INP   LOCATION:  3016                         FACILITY:  [facility]   PHYSICIAN:  [name], Evan PopeDATE OF BIRTH:  October 19, 1945   DATE OF ADMISSION:  07/05/2005  DATE OF DISCHARGE:                                 DISCHARGE SUMMARY   ADDENDUM:   DISCHARGE INSTRUCTIONS:  The patient was discharged to [facility]  [place], also known [facility], with a PICC to be removed at the  facility on August 17, 2005; this was confirmed with nursing staff, [name], as well [name], that they would be able to remove the PICC line at the  facility.  If problems persist with this, please have the patient return to  the hospital and call [phone] to have him scheduled with IV therapy for  PICC line removal.      [name], M.D.    ______________________________  [name], M.D.    MR/MEDQ  D:  07/11/2005  T:  07/12/2005  Job:  [id number]
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
## 2011-03-28 LAB — URINE CULTURE: Colony Count: >=100000

## 2011-03-28 LAB — URINALYSIS, ROUTINE W REFLEX MICROSCOPIC
Bilirubin Urine: NEGATIVE
Ketones, ur: NEGATIVE
Nitrite: NEGATIVE
Protein, ur: 100 — AB
Specific Gravity, Urine: 1.015
Urobilinogen, UA: 0.2

## 2011-04-05 LAB — GLUCOSE, CAPILLARY: Glucose-Capillary: 105 mg/dL — ABNORMAL HIGH (ref 70–99)

## 2011-04-05 LAB — TYPE AND SCREEN: Antibody Screen: NEGATIVE

## 2011-04-06 LAB — B-NATRIURETIC PEPTIDE (CONVERTED LAB)
Pro B Natriuretic peptide (BNP): 331 pg/mL — ABNORMAL HIGH (ref 0.0–100.0)
Pro B Natriuretic peptide (BNP): 411 pg/mL — ABNORMAL HIGH (ref 0.0–100.0)
Pro B Natriuretic peptide (BNP): 508 pg/mL — ABNORMAL HIGH (ref 0.0–100.0)

## 2011-04-06 LAB — COMPREHENSIVE METABOLIC PANEL
ALT: 146 U/L — ABNORMAL HIGH (ref 0–53)
ALT: 18 U/L (ref 0–53)
ALT: 21 U/L (ref 0–53)
ALT: 530 U/L — ABNORMAL HIGH (ref 0–53)
ALT: 74 U/L — ABNORMAL HIGH (ref 0–53)
AST: 21 U/L (ref 0–37)
Albumin: 2.1 g/dL — ABNORMAL LOW (ref 3.5–5.2)
Albumin: 2.2 g/dL — ABNORMAL LOW (ref 3.5–5.2)
Albumin: 2.2 g/dL — ABNORMAL LOW (ref 3.5–5.2)
Alkaline Phosphatase: 63 U/L (ref 39–117)
Alkaline Phosphatase: 64 U/L (ref 39–117)
BUN: 25 mg/dL — ABNORMAL HIGH (ref 6–23)
BUN: 32 mg/dL — ABNORMAL HIGH (ref 6–23)
BUN: 7 mg/dL (ref 6–23)
BUN: 7 mg/dL (ref 6–23)
BUN: 9 mg/dL (ref 6–23)
CO2: 20 mEq/L (ref 19–32)
CO2: 26 mEq/L (ref 19–32)
CO2: 26 mEq/L (ref 19–32)
CO2: 27 mEq/L (ref 19–32)
CO2: 30 mEq/L (ref 19–32)
CO2: 31 mEq/L (ref 19–32)
CO2: 31 mEq/L (ref 19–32)
Calcium: 7.6 mg/dL — ABNORMAL LOW (ref 8.4–10.5)
Calcium: 8 mg/dL — ABNORMAL LOW (ref 8.4–10.5)
Calcium: 8.2 mg/dL — ABNORMAL LOW (ref 8.4–10.5)
Calcium: 8.5 mg/dL (ref 8.4–10.5)
Calcium: 9.1 mg/dL (ref 8.4–10.5)
Chloride: 103 mEq/L (ref 96–112)
Chloride: 106 mEq/L (ref 96–112)
Chloride: 107 mEq/L (ref 96–112)
Chloride: 112 mEq/L (ref 96–112)
Chloride: 114 mEq/L — ABNORMAL HIGH (ref 96–112)
Creatinine, Ser: 0.75 mg/dL (ref 0.4–1.5)
Creatinine, Ser: 0.86 mg/dL (ref 0.4–1.5)
Creatinine, Ser: 1.59 mg/dL — ABNORMAL HIGH (ref 0.4–1.5)
Creatinine, Ser: 3.05 mg/dL — ABNORMAL HIGH (ref 0.4–1.5)
Creatinine, Ser: 3.79 mg/dL — ABNORMAL HIGH (ref 0.4–1.5)
GFR calc Af Amer: 25 mL/min — ABNORMAL LOW (ref >60)
GFR calc Af Amer: >60 mL/min (ref >60)
GFR calc non Af Amer: 16 mL/min — ABNORMAL LOW (ref >60)
GFR calc non Af Amer: 21 mL/min — ABNORMAL LOW (ref >60)
GFR calc non Af Amer: 36 mL/min — ABNORMAL LOW (ref >60)
GFR calc non Af Amer: 45 mL/min — ABNORMAL LOW (ref >60)
GFR calc non Af Amer: >60 mL/min (ref >60)
GFR calc non Af Amer: >60 mL/min (ref >60)
GFR calc non Af Amer: >60 mL/min (ref >60)
Glucose, Bld: 113 mg/dL — ABNORMAL HIGH (ref 70–99)
Glucose, Bld: 124 mg/dL — ABNORMAL HIGH (ref 70–99)
Glucose, Bld: 138 mg/dL — ABNORMAL HIGH (ref 70–99)
Glucose, Bld: 163 mg/dL — ABNORMAL HIGH (ref 70–99)
Glucose, Bld: 204 mg/dL — ABNORMAL HIGH (ref 70–99)
Glucose, Bld: 377 mg/dL — ABNORMAL HIGH (ref 70–99)
Glucose, Bld: 79 mg/dL (ref 70–99)
Potassium: 2.9 mEq/L — ABNORMAL LOW (ref 3.5–5.1)
Potassium: 3.6 mEq/L (ref 3.5–5.1)
Sodium: 140 mEq/L (ref 135–145)
Sodium: 141 mEq/L (ref 135–145)
Sodium: 141 mEq/L (ref 135–145)
Sodium: 147 mEq/L — ABNORMAL HIGH (ref 135–145)
Sodium: 149 mEq/L — ABNORMAL HIGH (ref 135–145)
Total Bilirubin: 0.4 mg/dL (ref 0.3–1.2)
Total Bilirubin: 0.5 mg/dL (ref 0.3–1.2)
Total Bilirubin: 1.4 mg/dL — ABNORMAL HIGH (ref 0.3–1.2)
Total Bilirubin: 2.1 mg/dL — ABNORMAL HIGH (ref 0.3–1.2)
Total Bilirubin: 2.1 mg/dL — ABNORMAL HIGH (ref 0.3–1.2)
Total Bilirubin: 3.7 mg/dL — ABNORMAL HIGH (ref 0.3–1.2)
Total Protein: 6.2 g/dL (ref 6.0–8.3)
Total Protein: 6.4 g/dL (ref 6.0–8.3)
Total Protein: 6.9 g/dL (ref 6.0–8.3)

## 2011-04-06 LAB — GLUCOSE, CAPILLARY
Glucose-Capillary: 100 mg/dL — ABNORMAL HIGH (ref 70–99)
Glucose-Capillary: 101 mg/dL — ABNORMAL HIGH (ref 70–99)
Glucose-Capillary: 102 mg/dL — ABNORMAL HIGH (ref 70–99)
Glucose-Capillary: 111 mg/dL — ABNORMAL HIGH (ref 70–99)
Glucose-Capillary: 115 mg/dL — ABNORMAL HIGH (ref 70–99)
Glucose-Capillary: 117 mg/dL — ABNORMAL HIGH (ref 70–99)
Glucose-Capillary: 118 mg/dL — ABNORMAL HIGH (ref 70–99)
Glucose-Capillary: 120 mg/dL — ABNORMAL HIGH (ref 70–99)
Glucose-Capillary: 121 mg/dL — ABNORMAL HIGH (ref 70–99)
Glucose-Capillary: 123 mg/dL — ABNORMAL HIGH (ref 70–99)
Glucose-Capillary: 123 mg/dL — ABNORMAL HIGH (ref 70–99)
Glucose-Capillary: 123 mg/dL — ABNORMAL HIGH (ref 70–99)
Glucose-Capillary: 124 mg/dL — ABNORMAL HIGH (ref 70–99)
Glucose-Capillary: 125 mg/dL — ABNORMAL HIGH (ref 70–99)
Glucose-Capillary: 133 mg/dL — ABNORMAL HIGH (ref 70–99)
Glucose-Capillary: 138 mg/dL — ABNORMAL HIGH (ref 70–99)
Glucose-Capillary: 138 mg/dL — ABNORMAL HIGH (ref 70–99)
Glucose-Capillary: 139 mg/dL — ABNORMAL HIGH (ref 70–99)
Glucose-Capillary: 142 mg/dL — ABNORMAL HIGH (ref 70–99)
Glucose-Capillary: 143 mg/dL — ABNORMAL HIGH (ref 70–99)
Glucose-Capillary: 145 mg/dL — ABNORMAL HIGH (ref 70–99)
Glucose-Capillary: 145 mg/dL — ABNORMAL HIGH (ref 70–99)
Glucose-Capillary: 146 mg/dL — ABNORMAL HIGH (ref 70–99)
Glucose-Capillary: 148 mg/dL — ABNORMAL HIGH (ref 70–99)
Glucose-Capillary: 152 mg/dL — ABNORMAL HIGH (ref 70–99)
Glucose-Capillary: 154 mg/dL — ABNORMAL HIGH (ref 70–99)
Glucose-Capillary: 155 mg/dL — ABNORMAL HIGH (ref 70–99)
Glucose-Capillary: 155 mg/dL — ABNORMAL HIGH (ref 70–99)
Glucose-Capillary: 158 mg/dL — ABNORMAL HIGH (ref 70–99)
Glucose-Capillary: 158 mg/dL — ABNORMAL HIGH (ref 70–99)
Glucose-Capillary: 160 mg/dL — ABNORMAL HIGH (ref 70–99)
Glucose-Capillary: 160 mg/dL — ABNORMAL HIGH (ref 70–99)
Glucose-Capillary: 161 mg/dL — ABNORMAL HIGH (ref 70–99)
Glucose-Capillary: 162 mg/dL — ABNORMAL HIGH (ref 70–99)
Glucose-Capillary: 163 mg/dL — ABNORMAL HIGH (ref 70–99)
Glucose-Capillary: 163 mg/dL — ABNORMAL HIGH (ref 70–99)
Glucose-Capillary: 163 mg/dL — ABNORMAL HIGH (ref 70–99)
Glucose-Capillary: 165 mg/dL — ABNORMAL HIGH (ref 70–99)
Glucose-Capillary: 165 mg/dL — ABNORMAL HIGH (ref 70–99)
Glucose-Capillary: 167 mg/dL — ABNORMAL HIGH (ref 70–99)
Glucose-Capillary: 167 mg/dL — ABNORMAL HIGH (ref 70–99)
Glucose-Capillary: 168 mg/dL — ABNORMAL HIGH (ref 70–99)
Glucose-Capillary: 175 mg/dL — ABNORMAL HIGH (ref 70–99)
Glucose-Capillary: 178 mg/dL — ABNORMAL HIGH (ref 70–99)
Glucose-Capillary: 179 mg/dL — ABNORMAL HIGH (ref 70–99)
Glucose-Capillary: 180 mg/dL — ABNORMAL HIGH (ref 70–99)
Glucose-Capillary: 181 mg/dL — ABNORMAL HIGH (ref 70–99)
Glucose-Capillary: 185 mg/dL — ABNORMAL HIGH (ref 70–99)
Glucose-Capillary: 196 mg/dL — ABNORMAL HIGH (ref 70–99)
Glucose-Capillary: 197 mg/dL — ABNORMAL HIGH (ref 70–99)
Glucose-Capillary: 205 mg/dL — ABNORMAL HIGH (ref 70–99)
Glucose-Capillary: 207 mg/dL — ABNORMAL HIGH (ref 70–99)
Glucose-Capillary: 224 mg/dL — ABNORMAL HIGH (ref 70–99)
Glucose-Capillary: 236 mg/dL — ABNORMAL HIGH (ref 70–99)
Glucose-Capillary: 263 mg/dL — ABNORMAL HIGH (ref 70–99)
Glucose-Capillary: 268 mg/dL — ABNORMAL HIGH (ref 70–99)
Glucose-Capillary: 276 mg/dL — ABNORMAL HIGH (ref 70–99)
Glucose-Capillary: 283 mg/dL — ABNORMAL HIGH (ref 70–99)
Glucose-Capillary: 367 mg/dL — ABNORMAL HIGH (ref 70–99)
Glucose-Capillary: 386 mg/dL — ABNORMAL HIGH (ref 70–99)
Glucose-Capillary: 86 mg/dL (ref 70–99)
Glucose-Capillary: 86 mg/dL (ref 70–99)
Glucose-Capillary: 92 mg/dL (ref 70–99)

## 2011-04-06 LAB — CBC
HCT: 25.3 % — ABNORMAL LOW (ref 39.0–52.0)
HCT: 25.6 % — ABNORMAL LOW (ref 39.0–52.0)
HCT: 25.7 % — ABNORMAL LOW (ref 39.0–52.0)
HCT: 25.8 % — ABNORMAL LOW (ref 39.0–52.0)
HCT: 26.1 % — ABNORMAL LOW (ref 39.0–52.0)
HCT: 26.7 % — ABNORMAL LOW (ref 39.0–52.0)
HCT: 27.6 % — ABNORMAL LOW (ref 39.0–52.0)
HCT: 28.1 % — ABNORMAL LOW (ref 39.0–52.0)
HCT: 28.7 % — ABNORMAL LOW (ref 39.0–52.0)
HCT: 29.9 % — ABNORMAL LOW (ref 39.0–52.0)
HCT: 33.5 % — ABNORMAL LOW (ref 39.0–52.0)
Hemoglobin: 10.1 g/dL — ABNORMAL LOW (ref 13.0–17.0)
Hemoglobin: 10.4 g/dL — ABNORMAL LOW (ref 13.0–17.0)
Hemoglobin: 11.3 g/dL — ABNORMAL LOW (ref 13.0–17.0)
Hemoglobin: 12.1 g/dL — ABNORMAL LOW (ref 13.0–17.0)
Hemoglobin: 8.5 g/dL — ABNORMAL LOW (ref 13.0–17.0)
Hemoglobin: 8.8 g/dL — ABNORMAL LOW (ref 13.0–17.0)
Hemoglobin: 9.2 g/dL — ABNORMAL LOW (ref 13.0–17.0)
Hemoglobin: 9.2 g/dL — ABNORMAL LOW (ref 13.0–17.0)
Hemoglobin: 9.4 g/dL — ABNORMAL LOW (ref 13.0–17.0)
Hemoglobin: 9.4 g/dL — ABNORMAL LOW (ref 13.0–17.0)
Hemoglobin: 9.4 g/dL — ABNORMAL LOW (ref 13.0–17.0)
Hemoglobin: 9.5 g/dL — ABNORMAL LOW (ref 13.0–17.0)
MCHC: 33.1 g/dL (ref 30.0–36.0)
MCHC: 33.1 g/dL (ref 30.0–36.0)
MCHC: 33.2 g/dL (ref 30.0–36.0)
MCHC: 33.2 g/dL (ref 30.0–36.0)
MCHC: 33.4 g/dL (ref 30.0–36.0)
MCHC: 33.4 g/dL (ref 30.0–36.0)
MCHC: 33.5 g/dL (ref 30.0–36.0)
MCHC: 33.6 g/dL (ref 30.0–36.0)
MCHC: 33.6 g/dL (ref 30.0–36.0)
MCHC: 33.6 g/dL (ref 30.0–36.0)
MCHC: 33.6 g/dL (ref 30.0–36.0)
MCHC: 33.9 g/dL (ref 30.0–36.0)
MCHC: 33.9 g/dL (ref 30.0–36.0)
MCV: 91.6 fL (ref 78.0–100.0)
MCV: 91.7 fL (ref 78.0–100.0)
MCV: 91.8 fL (ref 78.0–100.0)
MCV: 92.4 fL (ref 78.0–100.0)
MCV: 92.7 fL (ref 78.0–100.0)
MCV: 92.9 fL (ref 78.0–100.0)
MCV: 92.9 fL (ref 78.0–100.0)
MCV: 93 fL (ref 78.0–100.0)
MCV: 93.2 fL (ref 78.0–100.0)
MCV: 93.4 fL (ref 78.0–100.0)
MCV: 94 fL (ref 78.0–100.0)
MCV: 94.8 fL (ref 78.0–100.0)
Platelets: 105 10*3/uL — ABNORMAL LOW (ref 150–400)
Platelets: 225 10*3/uL (ref 150–400)
Platelets: 234 10*3/uL (ref 150–400)
Platelets: 235 10*3/uL (ref 150–400)
Platelets: 243 10*3/uL (ref 150–400)
Platelets: 253 10*3/uL (ref 150–400)
Platelets: 270 10*3/uL (ref 150–400)
Platelets: 305 10*3/uL (ref 150–400)
Platelets: 315 10*3/uL (ref 150–400)
Platelets: 317 10*3/uL (ref 150–400)
Platelets: 333 10*3/uL (ref 150–400)
Platelets: 357 10*3/uL (ref 150–400)
Platelets: 95 10*3/uL — ABNORMAL LOW (ref 150–400)
RBC: 2.48 MIL/uL — ABNORMAL LOW (ref 4.22–5.81)
RBC: 2.72 MIL/uL — ABNORMAL LOW (ref 4.22–5.81)
RBC: 2.73 MIL/uL — ABNORMAL LOW (ref 4.22–5.81)
RBC: 2.74 MIL/uL — ABNORMAL LOW (ref 4.22–5.81)
RBC: 2.76 MIL/uL — ABNORMAL LOW (ref 4.22–5.81)
RBC: 2.82 MIL/uL — ABNORMAL LOW (ref 4.22–5.81)
RBC: 2.86 MIL/uL — ABNORMAL LOW (ref 4.22–5.81)
RBC: 2.99 MIL/uL — ABNORMAL LOW (ref 4.22–5.81)
RBC: 3.01 MIL/uL — ABNORMAL LOW (ref 4.22–5.81)
RBC: 3.05 MIL/uL — ABNORMAL LOW (ref 4.22–5.81)
RBC: 3.12 MIL/uL — ABNORMAL LOW (ref 4.22–5.81)
RBC: 3.16 MIL/uL — ABNORMAL LOW (ref 4.22–5.81)
RBC: 3.31 MIL/uL — ABNORMAL LOW (ref 4.22–5.81)
RBC: 3.91 MIL/uL — ABNORMAL LOW (ref 4.22–5.81)
RDW: 14 % (ref 11.5–15.5)
RDW: 14.3 % (ref 11.5–15.5)
RDW: 14.4 % (ref 11.5–15.5)
RDW: 14.5 % (ref 11.5–15.5)
RDW: 14.5 % (ref 11.5–15.5)
RDW: 14.7 % (ref 11.5–15.5)
RDW: 14.7 % (ref 11.5–15.5)
RDW: 14.7 % (ref 11.5–15.5)
RDW: 14.9 % (ref 11.5–15.5)
RDW: 15.1 % (ref 11.5–15.5)
WBC: 10.4 10*3/uL (ref 4.0–10.5)
WBC: 10.6 10*3/uL — ABNORMAL HIGH (ref 4.0–10.5)
WBC: 11 10*3/uL — ABNORMAL HIGH (ref 4.0–10.5)
WBC: 16.5 10*3/uL — ABNORMAL HIGH (ref 4.0–10.5)
WBC: 16.6 10*3/uL — ABNORMAL HIGH (ref 4.0–10.5)
WBC: 17.1 10*3/uL — ABNORMAL HIGH (ref 4.0–10.5)
WBC: 19 10*3/uL — ABNORMAL HIGH (ref 4.0–10.5)
WBC: 8.5 10*3/uL (ref 4.0–10.5)
WBC: 9.1 10*3/uL (ref 4.0–10.5)
WBC: 9.2 10*3/uL (ref 4.0–10.5)
WBC: 9.2 10*3/uL (ref 4.0–10.5)

## 2011-04-06 LAB — PHENYTOIN LEVEL, TOTAL
Phenytoin Lvl: 16.2 ug/mL (ref 10.0–20.0)
Phenytoin Lvl: 3.5 ug/mL — ABNORMAL LOW (ref 10.0–20.0)
Phenytoin Lvl: 5.8 ug/mL — ABNORMAL LOW (ref 10.0–20.0)
Phenytoin Lvl: <2.5 ug/mL — ABNORMAL LOW (ref 10.0–20.0)

## 2011-04-06 LAB — BASIC METABOLIC PANEL
BUN: 10 mg/dL (ref 6–23)
BUN: 11 mg/dL (ref 6–23)
BUN: 12 mg/dL (ref 6–23)
BUN: 13 mg/dL (ref 6–23)
BUN: 16 mg/dL (ref 6–23)
BUN: 19 mg/dL (ref 6–23)
BUN: 7 mg/dL (ref 6–23)
BUN: 7 mg/dL (ref 6–23)
BUN: 7 mg/dL (ref 6–23)
BUN: 81 mg/dL — ABNORMAL HIGH (ref 6–23)
CO2: 22 mEq/L (ref 19–32)
CO2: 26 mEq/L (ref 19–32)
CO2: 27 mEq/L (ref 19–32)
CO2: 27 mEq/L (ref 19–32)
CO2: 29 mEq/L (ref 19–32)
CO2: 30 mEq/L (ref 19–32)
CO2: 31 mEq/L (ref 19–32)
Calcium: 7.8 mg/dL — ABNORMAL LOW (ref 8.4–10.5)
Calcium: 8.4 mg/dL (ref 8.4–10.5)
Calcium: 8.5 mg/dL (ref 8.4–10.5)
Calcium: 8.7 mg/dL (ref 8.4–10.5)
Calcium: 9.3 mg/dL (ref 8.4–10.5)
Calcium: 9.5 mg/dL (ref 8.4–10.5)
Chloride: 105 mEq/L (ref 96–112)
Chloride: 105 mEq/L (ref 96–112)
Chloride: 106 mEq/L (ref 96–112)
Chloride: 106 mEq/L (ref 96–112)
Chloride: 111 mEq/L (ref 96–112)
Chloride: 117 mEq/L — ABNORMAL HIGH (ref 96–112)
Chloride: 121 mEq/L — ABNORMAL HIGH (ref 96–112)
Creatinine, Ser: 0.64 mg/dL (ref 0.4–1.5)
Creatinine, Ser: 0.76 mg/dL (ref 0.4–1.5)
Creatinine, Ser: 0.77 mg/dL (ref 0.4–1.5)
Creatinine, Ser: 0.94 mg/dL (ref 0.4–1.5)
Creatinine, Ser: 0.96 mg/dL (ref 0.4–1.5)
Creatinine, Ser: 0.98 mg/dL (ref 0.4–1.5)
Creatinine, Ser: 1 mg/dL (ref 0.4–1.5)
GFR calc Af Amer: >60 mL/min (ref >60)
GFR calc Af Amer: >60 mL/min (ref >60)
GFR calc Af Amer: >60 mL/min (ref >60)
GFR calc Af Amer: >60 mL/min (ref >60)
GFR calc Af Amer: >60 mL/min (ref >60)
GFR calc Af Amer: >60 mL/min (ref >60)
GFR calc Af Amer: >60 mL/min (ref >60)
GFR calc non Af Amer: 15 mL/min — ABNORMAL LOW (ref >60)
GFR calc non Af Amer: 59 mL/min — ABNORMAL LOW (ref >60)
GFR calc non Af Amer: >60 mL/min (ref >60)
GFR calc non Af Amer: >60 mL/min (ref >60)
GFR calc non Af Amer: >60 mL/min (ref >60)
GFR calc non Af Amer: >60 mL/min (ref >60)
Glucose, Bld: 119 mg/dL — ABNORMAL HIGH (ref 70–99)
Glucose, Bld: 169 mg/dL — ABNORMAL HIGH (ref 70–99)
Glucose, Bld: 171 mg/dL — ABNORMAL HIGH (ref 70–99)
Glucose, Bld: 193 mg/dL — ABNORMAL HIGH (ref 70–99)
Glucose, Bld: 201 mg/dL — ABNORMAL HIGH (ref 70–99)
Glucose, Bld: 231 mg/dL — ABNORMAL HIGH (ref 70–99)
Glucose, Bld: 377 mg/dL — ABNORMAL HIGH (ref 70–99)
Glucose, Bld: 94 mg/dL (ref 70–99)
Potassium: 2.9 mEq/L — ABNORMAL LOW (ref 3.5–5.1)
Potassium: 3 mEq/L — ABNORMAL LOW (ref 3.5–5.1)
Potassium: 3.2 mEq/L — ABNORMAL LOW (ref 3.5–5.1)
Potassium: 3.2 mEq/L — ABNORMAL LOW (ref 3.5–5.1)
Potassium: 3.2 mEq/L — ABNORMAL LOW (ref 3.5–5.1)
Potassium: 3.4 mEq/L — ABNORMAL LOW (ref 3.5–5.1)
Potassium: 3.8 mEq/L (ref 3.5–5.1)
Sodium: 137 mEq/L (ref 135–145)
Sodium: 141 mEq/L (ref 135–145)
Sodium: 142 mEq/L (ref 135–145)
Sodium: 143 mEq/L (ref 135–145)
Sodium: 156 mEq/L — ABNORMAL HIGH (ref 135–145)

## 2011-04-06 LAB — VANCOMYCIN, TROUGH
Vancomycin Tr: 10.4 ug/mL (ref 10.0–20.0)
Vancomycin Tr: 28.3 ug/mL (ref 10.0–20.0)

## 2011-04-06 LAB — URINALYSIS, ROUTINE W REFLEX MICROSCOPIC
Bilirubin Urine: NEGATIVE
Bilirubin Urine: NEGATIVE
Glucose, UA: NEGATIVE mg/dL
Glucose, UA: NEGATIVE mg/dL
Ketones, ur: 15 mg/dL — AB
Ketones, ur: 40 mg/dL — AB
Ketones, ur: NEGATIVE mg/dL
Nitrite: NEGATIVE
Protein, ur: >300 mg/dL — AB
Specific Gravity, Urine: 1.01 (ref 1.005–1.030)
Specific Gravity, Urine: 1.018 (ref 1.005–1.030)
Specific Gravity, Urine: 1.021 (ref 1.005–1.030)
Urobilinogen, UA: 0.2 mg/dL (ref 0.0–1.0)
Urobilinogen, UA: 1 mg/dL (ref 0.0–1.0)
Urobilinogen, UA: 1 mg/dL (ref 0.0–1.0)
pH: 7.5 (ref 5.0–8.0)

## 2011-04-06 LAB — CULTURE, BLOOD (ROUTINE X 2)
Culture: NO GROWTH
Culture: NO GROWTH
Culture: NO GROWTH
Culture: NO GROWTH
Culture: NO GROWTH
Culture: NO GROWTH
Culture: NO GROWTH
Culture: NO GROWTH
Culture: NO GROWTH

## 2011-04-06 LAB — URINE MICROSCOPIC-ADD ON

## 2011-04-06 LAB — POCT I-STAT 3, ART BLOOD GAS (G3+)
Acid-base deficit: 2 mmol/L (ref 0.0–2.0)
Bicarbonate: 22.4 mEq/L (ref 20.0–24.0)
O2 Saturation: 98 %
TCO2: 24 mmol/L (ref 0–100)
pCO2 arterial: 37.2 mmHg (ref 35.0–45.0)
pO2, Arterial: 115 mmHg — ABNORMAL HIGH (ref 80.0–100.0)

## 2011-04-06 LAB — LACTIC ACID, PLASMA
Lactic Acid, Venous: 1.2 mmol/L (ref 0.5–2.2)
Lactic Acid, Venous: 1.3 mmol/L (ref 0.5–2.2)
Lactic Acid, Venous: 1.5 mmol/L (ref 0.5–2.2)

## 2011-04-06 LAB — POCT CARDIAC MARKERS: CKMB, poc: 2.4 ng/mL (ref 1.0–8.0)

## 2011-04-06 LAB — BLOOD GAS, ARTERIAL
Bicarbonate: 26.6 mEq/L — ABNORMAL HIGH (ref 20.0–24.0)
O2 Content: 2 L/min
Patient temperature: 98.6
TCO2: 23 mmol/L (ref 0–100)
TCO2: 28.1 mmol/L (ref 0–100)
pCO2 arterial: 41.3 mmHg (ref 35.0–45.0)
pCO2 arterial: 48.9 mmHg — ABNORMAL HIGH (ref 35.0–45.0)
pH, Arterial: 7.341 — ABNORMAL LOW (ref 7.350–7.450)
pH, Arterial: 7.355 (ref 7.350–7.450)
pO2, Arterial: 124 mmHg — ABNORMAL HIGH (ref 80.0–100.0)

## 2011-04-06 LAB — VALPROIC ACID LEVEL
Valproic Acid Lvl: 13.1 ug/mL — ABNORMAL LOW (ref 50.0–100.0)
Valproic Acid Lvl: 24.9 ug/mL — ABNORMAL LOW (ref 50.0–100.0)
Valproic Acid Lvl: 25.1 ug/mL — ABNORMAL LOW (ref 50.0–100.0)

## 2011-04-06 LAB — DIFFERENTIAL
Basophils Absolute: 0 10*3/uL (ref 0.0–0.1)
Basophils Relative: 0 % (ref 0–1)
Basophils Relative: 0 % (ref 0–1)
Eosinophils Absolute: 0 10*3/uL (ref 0.0–0.7)
Eosinophils Absolute: 0 10*3/uL (ref 0.0–0.7)
Eosinophils Absolute: 0.3 10*3/uL (ref 0.0–0.7)
Eosinophils Relative: 0 % (ref 0–5)
Eosinophils Relative: 0 % (ref 0–5)
Eosinophils Relative: 3 % (ref 0–5)
Lymphocytes Relative: 19 % (ref 12–46)
Lymphs Abs: 1 10*3/uL (ref 0.7–4.0)
Lymphs Abs: 1.3 10*3/uL (ref 0.7–4.0)
Lymphs Abs: 1.6 10*3/uL (ref 0.7–4.0)
Monocytes Absolute: 1 10*3/uL (ref 0.1–1.0)
Monocytes Absolute: 1.4 10*3/uL — ABNORMAL HIGH (ref 0.1–1.0)
Neutrophils Relative %: 84 % — ABNORMAL HIGH (ref 43–77)

## 2011-04-06 LAB — CARDIAC PANEL(CRET KIN+CKTOT+MB+TROPI)
CK, MB: 18 ng/mL — ABNORMAL HIGH (ref 0.3–4.0)
Relative Index: 2.1 (ref 0.0–2.5)
Relative Index: 2.4 (ref 0.0–2.5)
Total CK: 584 U/L — ABNORMAL HIGH (ref 7–232)
Total CK: 873 U/L — ABNORMAL HIGH (ref 7–232)
Troponin I: 1.1 ng/mL (ref 0.00–0.06)
Troponin I: 1.16 ng/mL (ref 0.00–0.06)

## 2011-04-06 LAB — CK TOTAL AND CKMB (NOT AT ARMC)
CK, MB: 20 ng/mL — ABNORMAL HIGH (ref 0.3–4.0)
Relative Index: 1.4 (ref 0.0–2.5)
Total CK: 1470 U/L — ABNORMAL HIGH (ref 7–232)

## 2011-04-06 LAB — WOUND CULTURE

## 2011-04-06 LAB — PROTIME-INR: Prothrombin Time: 17.1 seconds — ABNORMAL HIGH (ref 11.6–15.2)

## 2011-04-06 LAB — URINE CULTURE
Colony Count: >=100000
Culture: NO GROWTH
Special Requests: NEGATIVE
Special Requests: NEGATIVE

## 2011-04-06 LAB — TROPONIN I: Troponin I: 0.99 ng/mL (ref 0.00–0.06)

## 2011-04-06 LAB — PHOSPHORUS: Phosphorus: 2.6 mg/dL (ref 2.3–4.6)

## 2011-04-06 LAB — MAGNESIUM: Magnesium: 3.7 mg/dL — ABNORMAL HIGH (ref 1.5–2.5)

## 2011-04-06 LAB — CROSSMATCH: Antibody Screen: NEGATIVE

## 2011-04-06 LAB — GLUCOSE, RANDOM: Glucose, Bld: 177 mg/dL — ABNORMAL HIGH (ref 70–99)

## 2011-04-06 LAB — HEMOGLOBIN A1C
Hgb A1c MFr Bld: 6.4 % — ABNORMAL HIGH (ref 4.6–6.1)
Mean Plasma Glucose: 137 mg/dL

## 2011-04-06 LAB — LIPASE, BLOOD: Lipase: 14 U/L (ref 11–59)

## 2011-04-06 LAB — HEPARIN ANTIBODY SCREEN: Heparin Antibody Screen: NEGATIVE

## 2011-09-28 ENCOUNTER — Emergency Department (HOSPITAL_COMMUNITY)
Admission: EM | Admit: 2011-09-28 | Discharge: 2011-09-29 | Disposition: A | Discharge status: Other Acute Inpatient Hospital | Payer: Medicare Other | Attending: Emergency Medicine | Admitting: Emergency Medicine

## 2011-09-28 DIAGNOSIS — T83010A Breakdown (mechanical) of cystostomy catheter, initial encounter: Secondary | ICD-10-CM

## 2011-09-28 DIAGNOSIS — Y846 Urinary catheterization as the cause of abnormal reaction of the patient, or of later complication, without mention of misadventure at the time of the procedure: Secondary | ICD-10-CM | POA: Insufficient documentation

## 2011-09-28 DIAGNOSIS — T83091A Other mechanical complication of indwelling urethral catheter, initial encounter: Secondary | ICD-10-CM | POA: Insufficient documentation

## 2011-09-28 NOTE — ED Notes (Signed)
Pt pulled out suprapubic cath. NH is unsure of exact time. Est 22:45.

## 2011-09-28 NOTE — ED Notes (Signed)
ZOX:[id number]<AV> Expected date:09/28/11<BR> Expected time:11:34 PM<BR> Means of arrival:<BR> Comments:<BR> Hold for EMS

## 2011-09-29 NOTE — ED Notes (Signed)
Pt transported back to [facility] via [place]

## 2011-09-29 NOTE — Discharge Instructions (Signed)
Resume standard suprapubic catheter care.

## 2011-09-29 NOTE — ED Notes (Signed)
[facility] contacted for pt transport back to [facility].

## 2011-09-29 NOTE — ED Provider Notes (Addendum)
History     CSN: [id number]  Arrival date & time 09/28/11  2328   First MD Initiated Contact with Patient 09/28/11 2352      Chief Complaint  Patient presents with  . Urine Output    (Consider location/radiation/quality/duration/timing/severity/associated sxs/prior treatment) The history is provided by the patient.   66 year old male had a suprapubic catheter pulled out accidentally. He has no complaints at this time.  No past medical history on file.  No past surgical history on file.  No family history on file.  History  Substance Use Topics  . Smoking status: Not on file  . Smokeless tobacco: Not on file  . Alcohol Use: Not on file      Review of Systems  All other systems reviewed and are negative.    Allergies  Review of patient's allergies indicates not on file.  Home Medications  No current outpatient prescriptions on file.  BP 118/69  Pulse 62  Temp(Src) 98 F (36.7 C) (Oral)  Resp 16  SpO2 98%  Physical Exam  Nursing note and vitals reviewed.  66 year old male who is resting comfortably and in no acute distress. Vital signs are normal. Oxygen saturation is 98% which is normal. Head is normocephalic and atraumatic. PERRLA, EOMI. Oropharynx is clear. Neck is nontender and supple. Back is nontender. Lungs are clear without any rales, wheezes, rhonchi. Heart has regular rate and rhythm without murmur. Abdomen is soft, flat, nontender without masses or hepatosplenomegaly. Opening from where the suprapubic catheter has been is present in the suprapubic area. Extremities he has bilateral above-the-knee amputations. Skin is warm and dry without rash. Neurologic: Mental status is normal, cranial nerves are intact, there no focal motor or sensory deficits.  ED Course  SUPRAPUBIC TUBE PLACEMENT Date/Time: 09/29/2011 1:55 AM Performed by: Dione Booze Authorized by: Preston Fleeting, Cambree Hendrix Consent: Verbal consent obtained. Written consent not obtained. Risks and  benefits: risks, benefits and alternatives were discussed Consent given by: patient Patient understanding: patient states understanding of the procedure being performed Patient consent: the patient's understanding of the procedure matches consent given Procedure consent: procedure consent matches procedure scheduled Relevant documents: relevant documents present and verified Site marked: the operative site was marked Required items: required blood products, implants, devices, and special equipment available Patient identity confirmed: verbally with patient and arm band Time out: Immediately prior to procedure a "time out" was called to verify the correct patient, procedure, equipment, support staff and site/side marked as required. Indications comment: Prior catheter had fallen out Local anesthesia used: no Patient sedated: no Preparation: Patient was prepped and draped in the usual sterile fashion. Suprapubic aspiration by: catheter Catheter type: Foley Catheter size: 18 Fr Number of attempts: 1 Urine characteristics: clear Patient tolerance: Patient tolerated the procedure well with no immediate complications. Comments: Catheter was placed through the pre-existing stoma.   (including critical care time)   1. Suprapubic catheter dysfunction       MDM  Suprapubic catheter has been pulled out. He has had a catheter for an extended period of time and it should be amenable to being replaced through the same tract.        Dione Booze, MD 09/29/11 1478  Dione Booze, MD 09/29/11 [phone]

## 2011-12-18 ENCOUNTER — Emergency Department (HOSPITAL_COMMUNITY): Payer: Medicare Other

## 2011-12-18 ENCOUNTER — Encounter (HOSPITAL_COMMUNITY): Payer: Self-pay | Admitting: *Deleted

## 2011-12-18 ENCOUNTER — Emergency Department (HOSPITAL_COMMUNITY)
Admission: EM | Admit: 2011-12-18 | Discharge: 2011-12-18 | Disposition: A | Discharge status: Home / Self Care | Payer: Medicare Other | Attending: Emergency Medicine | Admitting: Emergency Medicine

## 2011-12-18 DIAGNOSIS — Y846 Urinary catheterization as the cause of abnormal reaction of the patient, or of later complication, without mention of misadventure at the time of the procedure: Secondary | ICD-10-CM | POA: Insufficient documentation

## 2011-12-18 DIAGNOSIS — T83091A Other mechanical complication of indwelling urethral catheter, initial encounter: Secondary | ICD-10-CM | POA: Insufficient documentation

## 2011-12-18 DIAGNOSIS — T83010A Breakdown (mechanical) of cystostomy catheter, initial encounter: Secondary | ICD-10-CM

## 2011-12-18 HISTORY — DX: Unspecified psychosis not due to a substance or known physiological condition: F29

## 2011-12-18 HISTORY — DX: Major depressive disorder, single episode, unspecified: F32.9

## 2011-12-18 HISTORY — DX: Unspecified intestinal obstruction, unspecified as to partial versus complete obstruction: K56.609

## 2011-12-18 HISTORY — DX: Osteomyelitis, unspecified: M86.9

## 2011-12-18 HISTORY — DX: Chronic pulmonary edema: J81.1

## 2011-12-18 HISTORY — DX: Urinary tract infection, site not specified: N39.0

## 2011-12-18 HISTORY — DX: Non-pressure chronic ulcer of skin of other sites with unspecified severity: L98.499

## 2011-12-18 HISTORY — DX: Disorders of male genital organs in diseases classified elsewhere: N51

## 2011-12-18 HISTORY — DX: Thrombocytopenia, unspecified: D69.6

## 2011-12-18 HISTORY — DX: Acute kidney failure, unspecified: N17.9

## 2011-12-18 HISTORY — DX: Other specified depressive episodes: F32.89

## 2011-12-18 HISTORY — DX: Other chronic pain: G89.29

## 2011-12-18 HISTORY — DX: Acute upper respiratory infection, unspecified: J06.9

## 2011-12-18 HISTORY — DX: Constipation, unspecified: K59.00

## 2011-12-18 HISTORY — DX: Benign prostatic hyperplasia without lower urinary tract symptoms: N40.0

## 2011-12-18 HISTORY — DX: Other intestinal Escherichia coli infections: A04.4

## 2011-12-18 HISTORY — DX: Other ascites: R18.8

## 2011-12-18 HISTORY — DX: Other cerebrovascular disease: I67.89

## 2011-12-18 HISTORY — DX: Other long term (current) drug therapy: Z79.899

## 2011-12-18 HISTORY — DX: Other specified disorders of penis: N48.89

## 2011-12-18 HISTORY — DX: Essential (primary) hypertension: I10

## 2011-12-18 HISTORY — DX: Unspecified convulsions: R56.9

## 2011-12-18 LAB — CBC
HCT: 41 % (ref 39.0–52.0)
Hemoglobin: 14.2 g/dL (ref 13.0–17.0)
MCHC: 34.6 g/dL (ref 30.0–36.0)
MCV: 86.7 fL (ref 78.0–100.0)
WBC: 6.3 10*3/uL (ref 4.0–10.5)

## 2011-12-18 LAB — DIFFERENTIAL
Eosinophils Relative: 4 % (ref 0–5)
Lymphocytes Relative: 47 % — ABNORMAL HIGH (ref 12–46)
Monocytes Absolute: 0.9 10*3/uL (ref 0.1–1.0)
Monocytes Relative: 14 % — ABNORMAL HIGH (ref 3–12)
Neutro Abs: 2.2 10*3/uL (ref 1.7–7.7)

## 2011-12-18 LAB — URINALYSIS, ROUTINE W REFLEX MICROSCOPIC
Nitrite: NEGATIVE
Protein, ur: 100 mg/dL — AB
Urobilinogen, UA: 0.2 mg/dL (ref 0.0–1.0)

## 2011-12-18 LAB — COMPREHENSIVE METABOLIC PANEL
BUN: 13 mg/dL (ref 6–23)
CO2: 30 mEq/L (ref 19–32)
Chloride: 100 mEq/L (ref 96–112)
Creatinine, Ser: 0.55 mg/dL (ref 0.50–1.35)
GFR calc Af Amer: >90 mL/min (ref >90)
GFR calc non Af Amer: >90 mL/min (ref >90)
Glucose, Bld: 124 mg/dL — ABNORMAL HIGH (ref 70–99)
Total Bilirubin: 0.1 mg/dL — ABNORMAL LOW (ref 0.3–1.2)

## 2011-12-18 LAB — URINE MICROSCOPIC-ADD ON

## 2011-12-18 MED ORDER — SODIUM CHLORIDE 0.9 % IV BOLUS (SEPSIS)
1000.0000 mL | Freq: Once | INTRAVENOUS | Status: AC
  Administered 2011-12-18: 1000 mL via INTRAVENOUS

## 2011-12-18 NOTE — ED Notes (Signed)
Pt comes in from [facility] because his suprapupic cath came out.

## 2011-12-18 NOTE — ED Notes (Signed)
[id number]<JY> Expected date:<BR> Expected time: 7:21 PM<BR> Means of arrival:<BR> Comments:<BR> M212 - superpubic cath needs reinserting

## 2011-12-18 NOTE — ED Provider Notes (Signed)
History     CSN: [id number]  Arrival date & time 12/18/11  4098   First MD Initiated Contact with Patient 12/18/11 2028      Chief Complaint  Patient presents with  . Weakness    Suprapubic cath. dislodged    (Consider location/radiation/quality/duration/timing/severity/associated sxs/prior treatment) HPI Patient presents to the emergency department from [facility] mouth facility because his suprapubic catheter came out. He states that he has had a catheter for a long time multiple years but he's not sure how many. He is unsure how it came out, he is not having any pain, abdominal pain, nausea, vomiting or diarrhea. Complaints at this time his vital signs are stable, he is in NAD and answers questions appropriately. He does admit to having seizures for which he takes seizure meds but he admits to feeling fine.  Past Medical History  Diagnosis Date  . Diabetes mellitus   . Orchitis and epididymitis in disease classified elsewhere   . Unspecified intestinal obstruction   . Unspecified osteomyelitis, site unspecified   . Encounter for long-term (current) use of other medications   . Hypertrophy of prostate without urinary obstruction and other lower urinary tract symptoms (LUTS)   . Depressive disorder, not elsewhere classified   . Edema of penis   . Pulmonary congestion and hypostasis   . Other ascites   . Thrombocytopenia   . Urinary tract infection, site not specified   . Chronic ulcer of other specified site   . Acute, but ill-defined, cerebrovascular disease   . Intestinal infection due to E. coli, unspecified   . Other convulsions   . Unspecified essential hypertension   . Urinary tract infection, site not specified   . Acute upper respiratory infections of unspecified site   . Other chronic pain   . Unspecified psychosis   . Unspecified constipation   . Acute kidney failure, unspecified     History reviewed. No pertinent past surgical history.  History reviewed. No  pertinent family history.  History  Substance Use Topics  . Smoking status: Not on file  . Smokeless tobacco: Not on file  . Alcohol Use: No      Review of Systems   HEENT: denies blurry vision or change in hearing PULMONARY: Denies difficulty breathing and SOB CARDIAC: denies chest pain or heart palpitations MUSCULOSKELETAL:  denies being unable to ambulate ABDOMEN AL: denies abdominal pain GU: denies loss of bowel or urinary control    Allergies  Heparin  Home Medications   Current Outpatient Rx  Name Route Sig Dispense Refill  . BISACODYL 10 MG RE SUPP Rectal Place 10 mg rectally as needed.     Marland Kitchen VITAMIN D 1000 UNITS PO TABS Oral Take 1,000 Units by mouth daily.    Marland Kitchen LEVETIRACETAM 100 MG/ML PO SOLN Oral Take 300 mg by mouth 2 (two) times daily.     Marland Kitchen LORAZEPAM 2 MG/ML PO CONC Oral Take 1 mg by mouth as needed. For seizure activity    . MAGNESIUM OXIDE 400 MG PO TABS Oral Take 400 mg by mouth daily.    . ADULT MULTIVITAMIN W/MINERALS CH Oral Take 1 tablet by mouth daily.    Marland Kitchen PHENYTOIN SODIUM EXTENDED 100 MG PO CAPS Oral Take 100 mg by mouth 2 (two) times daily.    Marland Kitchen POLYETHYLENE GLYCOL 3350 PO POWD Oral Take 17 g by mouth daily as needed.     Marland Kitchen RANITIDINE HCL 150 MG PO TABS Oral Take 150 mg by mouth at bedtime.    Marland Kitchen  SIMVASTATIN 40 MG PO TABS Oral Take 40 mg by mouth every evening.    Marland Kitchen VITAMIN C 500 MG PO TABS Oral Take 500 mg by mouth 2 (two) times daily.       BP 140/95  Pulse 79  Temp 97.4 F (36.3 C) (Oral)  Resp 16  SpO2 98%  Physical Exam  Nursing note and vitals reviewed. Constitutional: He appears well-developed and well-nourished. No distress.  HENT:  Head: Normocephalic and atraumatic.  Eyes: Pupils are equal, round, and reactive to light.  Neck: Normal range of motion. Neck supple.  Cardiovascular: Normal rate and regular rhythm.   Pulmonary/Chest: Effort normal.  Abdominal: Soft.       Pt has bilateral above the knee amputations    Genitourinary:       pts suprapubic catheter is intact, however, i am suspicious it may be clogged. Therefore, will change catheter. No signs of infection around the site or blood clots in tube. No tenderness to palpation of abdomen.  Neurological: He is alert.  Skin: Skin is warm and dry.    ED Course  Procedures (including critical care time)  Labs Reviewed  CBC - Abnormal; Notable for the following:    Platelets 145 (*)     All other components within normal limits  DIFFERENTIAL - Abnormal; Notable for the following:    Neutrophils Relative 35 (*)     Lymphocytes Relative 47 (*)     Monocytes Relative 14 (*)     All other components within normal limits  COMPREHENSIVE METABOLIC PANEL - Abnormal; Notable for the following:    Glucose, Bld 124 (*)     Total Bilirubin 0.1 (*)     All other components within normal limits  URINALYSIS, ROUTINE W REFLEX MICROSCOPIC - Abnormal; Notable for the following:    Color, Urine RED (*)  BIOCHEMICALS MAY BE AFFECTED BY COLOR   APPearance CLOUDY (*)     Hgb urine dipstick LARGE (*)     Ketones, ur TRACE (*)     Protein, ur 100 (*)     Leukocytes, UA LARGE (*)     All other components within normal limits  URINE MICROSCOPIC-ADD ON - Abnormal; Notable for the following:    Bacteria, UA FEW (*)     All other components within normal limits  URINE CULTURE   No results found.   1. Suprapubic catheter dysfunction       MDM   SUPRAPUBIC TUBE PLACEMENT Date/Time: 12/18/2011 9:11 PM  Performed by: Benjiman Core Authorized by: Benjiman Core Consent: Verbal consent obtained. Written consent not obtained. Risks and benefits: risks, benefits and alternatives were discussed Consent given by: patient  Patient understanding: patient states understanding of the procedure being performed  Patient consent: the patient's understanding of the procedure matches consent given  Procedure consent: procedure consent matches procedure scheduled   Relevant documents: relevant documents present and verified  Site marked: the operative site was marked Required items: required blood products, implants, devices, and special equipment available  Patient identity confirmed: verbally with patient and arm band  Time out: Immediately prior to procedure a "time out" was called to verify the correct patient, procedure, equipment, support staff and site/side marked as required. Indications comment: Prior catheter had fallen out Local anesthesia used: no Patient sedated: no Preparation: Patient was prepped and draped in the usual sterile fashion. Suprapubic aspiration by: catheter Catheter type: Foley Catheter size: 18 Fr Number of attempts: 1 Urine characteristics: clear Patient tolerance: Patient  tolerated the procedure well with no immediate complications. Comments: Catheter was placed through the pre-existing stoma. (including critical care time)   Pt received liter of normal saline while in the ED and lab work is unremarkable. Urinalysis shows dirty urine, cultures will be sent out.  Will send patient back to nursing home as he is medically cleared at this time.  Pt has been advised of the symptoms that warrant their return to the ED.       Dorthula Matas, PA 12/18/11 2247  Dorthula Matas, PA 12/18/11 2248

## 2011-12-18 NOTE — ED Provider Notes (Signed)
Medical screening examination/treatment/procedure(s) were conducted as a shared visit with non-physician practitioner(s) and myself.  I personally evaluated the patient during the encounter patient presented with generalized weakness and possible suprapubic catheter problems. Suprapubic catheter. It may have been clogged. It was changed out to a 14 [place] catheter by me. Using sterile technique a catheter was introduced deeply with return of urine balloon was inflated and the catheter was retracted. Lab works reassuring and patient was discharged back to the nursing home.[name]. Rubin Payor, MD 12/18/11 2355

## 2011-12-21 LAB — URINE CULTURE

## 2011-12-22 NOTE — ED Notes (Signed)
+   Urine Chart sent to EDP office for review. 

## 2011-12-23 NOTE — ED Notes (Signed)
Chart returned from EDP office. Likely contamination. Would not treat. Please have patient follow-up with PCP for recheck. Reviewed by Rhea Bleacher PA-C.

## 2011-12-23 NOTE — ED Notes (Signed)
Contacted nursing home and ensured patient was following up with PCP.

## 2012-01-13 ENCOUNTER — Encounter (HOSPITAL_COMMUNITY): Payer: Self-pay

## 2012-01-13 ENCOUNTER — Emergency Department (HOSPITAL_COMMUNITY)
Admission: EM | Admit: 2012-01-13 | Discharge: 2012-01-13 | Disposition: A | Discharge status: Skilled Nursing Facility | Payer: Medicare Other | Attending: Emergency Medicine | Admitting: Emergency Medicine

## 2012-01-13 ENCOUNTER — Emergency Department (HOSPITAL_COMMUNITY): Payer: Medicare Other

## 2012-01-13 DIAGNOSIS — Z79899 Other long term (current) drug therapy: Secondary | ICD-10-CM | POA: Insufficient documentation

## 2012-01-13 DIAGNOSIS — T83010A Breakdown (mechanical) of cystostomy catheter, initial encounter: Secondary | ICD-10-CM

## 2012-01-13 DIAGNOSIS — N400 Enlarged prostate without lower urinary tract symptoms: Secondary | ICD-10-CM | POA: Insufficient documentation

## 2012-01-13 DIAGNOSIS — S78119A Complete traumatic amputation at level between unspecified hip and knee, initial encounter: Secondary | ICD-10-CM | POA: Insufficient documentation

## 2012-01-13 DIAGNOSIS — Y846 Urinary catheterization as the cause of abnormal reaction of the patient, or of later complication, without mention of misadventure at the time of the procedure: Secondary | ICD-10-CM | POA: Insufficient documentation

## 2012-01-13 DIAGNOSIS — R339 Retention of urine, unspecified: Secondary | ICD-10-CM | POA: Insufficient documentation

## 2012-01-13 DIAGNOSIS — T8389XA Other specified complication of genitourinary prosthetic devices, implants and grafts, initial encounter: Secondary | ICD-10-CM | POA: Insufficient documentation

## 2012-01-13 DIAGNOSIS — R109 Unspecified abdominal pain: Secondary | ICD-10-CM | POA: Insufficient documentation

## 2012-01-13 DIAGNOSIS — E119 Type 2 diabetes mellitus without complications: Secondary | ICD-10-CM | POA: Insufficient documentation

## 2012-01-13 MED ORDER — IOHEXOL 300 MG/ML  SOLN
50.0000 mL | Freq: Once | INTRAMUSCULAR | Status: AC | PRN
  Administered 2012-01-13: 50 mL via INTRAVENOUS

## 2012-01-13 NOTE — ED Notes (Signed)
[facility] called for pt transport back to nursing home.

## 2012-01-13 NOTE — ED Notes (Signed)
Pt resides at [facility] Full Code.  Staff  Attempted to replaced suprapubic cath. Were not able to advance.  Staff informed EMS- old cath was not in proper location-  Staff reports abdominal distention- Pt c/o of abdominal pain.

## 2012-01-13 NOTE — ED Notes (Signed)
Suprapubic cath placed by EDP [facility] upon arrival to ED-

## 2012-01-13 NOTE — ED Notes (Signed)
Just applied stat lock on pt's left thigh.  Urology MD at bedside.

## 2012-01-13 NOTE — ED Notes (Signed)
[id number]<VH> Expected date:01/13/12<BR> Expected time: 6:03 PM<BR> Means of arrival:Ambulance<BR> Comments:<BR> Urinary problems

## 2012-01-13 NOTE — ED Provider Notes (Signed)
History     CSN: [id number]  Arrival date & time 01/13/12  1821   First MD Initiated Contact with Patient 01/13/12 1834    Level V caveat due to dementia.  Chief Complaint  Patient presents with  . Urinary Retention  . Abdominal Pain    (Consider location/radiation/quality/duration/timing/severity/associated sxs/prior treatment) Patient is a 66 y.o. male presenting with abdominal pain. The history is provided by the patient.  Abdominal Pain The primary symptoms of the illness include abdominal pain.   patient was sent in for suprapubic catheter placement. She had abdominal pain in the catheter was reportedly not in the upper location. They were unable to replace it. No fevers. No nausea vomiting diarrhea.  Past Medical History  Diagnosis Date  . Diabetes mellitus   . Orchitis and epididymitis in disease classified elsewhere   . Unspecified intestinal obstruction   . Unspecified osteomyelitis, site unspecified   . Encounter for long-term (current) use of other medications   . Hypertrophy of prostate without urinary obstruction and other lower urinary tract symptoms (LUTS)   . Depressive disorder, not elsewhere classified   . Edema of penis   . Pulmonary congestion and hypostasis   . Other ascites   . Thrombocytopenia   . Urinary tract infection, site not specified   . Chronic ulcer of other specified site   . Acute, but ill-defined, cerebrovascular disease   . Intestinal infection due to E. coli, unspecified   . Other convulsions   . Unspecified essential hypertension   . Urinary tract infection, site not specified   . Acute upper respiratory infections of unspecified site   . Other chronic pain   . Unspecified psychosis   . Unspecified constipation   . Acute kidney failure, unspecified     Past Surgical History  Procedure Date  . 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  History  Substance Use Topics  . Smoking  status: Not on file  . Smokeless tobacco: Not on file  . Alcohol Use: No      Review of Systems  Unable to perform ROS Gastrointestinal: Positive for abdominal pain.    Allergies  Heparin  Home Medications   Current Outpatient Rx  Name Route Sig Dispense Refill  . BISACODYL 10 MG RE SUPP Rectal Place 10 mg rectally as needed.     Marland Kitchen VITAMIN D 1000 UNITS PO TABS Oral Take 1,000 Units by mouth daily.    Marland Kitchen LEVETIRACETAM 100 MG/ML PO SOLN Oral Take 300 mg by mouth 2 (two) times daily.     Marland Kitchen LORAZEPAM 2 MG/ML PO CONC Oral Take 1 mg by mouth as needed. For seizure activity    . MAGNESIUM OXIDE 400 MG PO TABS Oral Take 400 mg by mouth daily.    . ADULT MULTIVITAMIN W/MINERALS CH Oral Take 1 tablet by mouth daily.    Marland Kitchen PHENYTOIN SODIUM EXTENDED 100 MG PO CAPS Oral Take 200 mg by mouth 2 (two) times daily.     Marland Kitchen POLYETHYLENE GLYCOL 3350 PO POWD Oral Take 17 g by mouth daily as needed. constipation    . RANITIDINE HCL 150 MG PO TABS Oral Take 150 mg by mouth at bedtime.    Marland Kitchen SIMVASTATIN 40 MG PO TABS Oral Take 40 mg by mouth every evening.    Marland Kitchen VITAMIN C 500 MG PO TABS Oral Take 500 mg by mouth 2 (two) times daily.  BP 104/71  Pulse 74  Temp 97.4 F (36.3 C) (Axillary)  Resp 18  SpO2 97%  Physical Exam  Constitutional: He appears well-developed.  HENT:  Head: Normocephalic.  Eyes: Pupils are equal, round, and reactive to light.  Cardiovascular: Normal rate and regular rhythm.   Abdominal: He exhibits distension. There is tenderness. There is no rebound and no guarding.  Genitourinary:       Suprapubic ostomy without catheter. Minimal leaking urine.  Musculoskeletal:       Left above-the-knee amputation.  Neurological: He is alert.  Skin: Skin is warm.    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  This is contiguous in its visualized aspects. Left lower quadrant approach catheter terminates over the mid pelvis.  Amorphous adjacent radiopacity is noted.  This does not conform to the expected configuration of the bladder.  Visualized bowel gas pattern is normal.  IMPRESSION: Left lower quadrant approach catheter terminates over the pelvis. The configuration of contrast does not confirm catheter placement within the bladder, and in fact the amorphous appearance may be due to position outside the bladder.  This would best be confirmed with real time fluoroscopy, especially if no urine output is observed from the catheter.  Original Report Authenticated By: Harrel Lemon, M.D.     1. Suprapubic catheter dysfunction       MDM  Patient came in for problems with suprapubic catheter. 14 [place] Foley catheter was sterilely placed by me. There was approximately an initial 30 cc of urine that returned. Later another 50 cc came out. The patient's abdomen became less tender less distended. KUB was done to verify Foley placement and with contrast was unable to verify the placement. Dr. McDiarmid was consulted and he came and evaluated the patient and believes the patient's catheter is properly placed. The patient be discharged back to the nursing home        [name]. Rubin Payor, MD 01/13/12 2044

## 2012-01-13 NOTE — Consult Note (Signed)
Urology Consult  Referring physician: Rubin Payor Reason for referral: Suprapubic tube not draining  Chief Complaint: Suprapubic tube not draining  History of Present Illness: Nursing home sent patient to ER with s/p tube not draining and ? Positioned correctly. Dr Rubin Payor replaced it- 30 cc's Took one xray- ? In correct position No pain No large output Now patient has clear urine output in bag and tubing Modifying factors: There are no other modifying factors  Associated signs and symptoms: There are no other associated signs and symptoms Aggravating and relieving factors: There are no other aggravating or relieving factors Severity: mild Duration: short duration   Past Medical History  Diagnosis Date  . Diabetes mellitus   . Orchitis and epididymitis in disease classified elsewhere   . Unspecified intestinal obstruction   . Unspecified osteomyelitis, site unspecified   . Encounter for long-term (current) use of other medications   . Hypertrophy of prostate without urinary obstruction and other lower urinary tract symptoms (LUTS)   . Depressive disorder, not elsewhere classified   . Edema of penis   . Pulmonary congestion and hypostasis   . Other ascites   . Thrombocytopenia   . Urinary tract infection, site not specified   . Chronic ulcer of other specified site   . Acute, but ill-defined, cerebrovascular disease   . Intestinal infection due to E. coli, unspecified   . Other convulsions   . Unspecified essential hypertension   . Urinary tract infection, site not specified   . Acute upper respiratory infections of unspecified site   . Other chronic pain   . Unspecified psychosis   . Unspecified constipation   . Acute kidney failure, unspecified    Past Surgical History  Procedure Date  . Below knee leg amputation     ABove the knee AKA bil    Medications: I have reviewed the patient's current medications. Allergies:  Allergies  Allergen Reactions  . Heparin  Other (See Comments)    Unknown reaction Not listed on MAR    No family history on file. Social History:  does not have a smoking history on file. He does not have any smokeless tobacco history on file. He reports that he does not drink alcohol or use illicit drugs.  ROS: All systems are reviewed and negative except as noted.   Physical Exam:  Vital signs in last 24 hours: Temp:  [97.4 F (36.3 C)-97.8 F (36.6 C)] 97.4 F (36.3 C) (07/14 1926) Pulse Rate:  [74-80] 74  (07/14 1926) Resp:  [18-25] 18  (07/14 1926) BP: (104-120)/(54-71) 104/71 mmHg (07/14 1926) SpO2:  [96 %-97 %] 97 % (07/14 1926)  Cardiovascular: Skin warm; not flushed Respiratory: Breaths quiet; no shortness of breath Abdomen: No masses Neurological: Normal sensation to touch Musculoskeletal: Normal motor function arms and legs Lymphatics: No inguinal adenopathy Skin: No rashes Genitourinary:suprapubic advance into bladder freely on a number of occasions Not painful Urine in tubing and 40cc plus in bag  Laboratory Data:  No results found for this or any previous visit (from the past 72 hour(s)). No results found for this or any previous visit (from the past 240 hour(s)). Creatinine: No results found for this basename: CREATININE:7 in the last 168 hours  Xrays: See report/chart KUB not very helpful  Impression/Assessment:  S/P tube appears to be draining well  Plan:  Send patient home/sed Dr Brunilda Payor tomorrow if any further isses  Embree Brawley A 01/13/2012, 8:33 PM

## 2012-01-26 ENCOUNTER — Emergency Department (HOSPITAL_COMMUNITY)
Admission: EM | Admit: 2012-01-26 | Discharge: 2012-01-27 | Disposition: A | Discharge status: Skilled Nursing Facility | Payer: Medicare Other | Attending: Emergency Medicine | Admitting: Emergency Medicine

## 2012-01-26 DIAGNOSIS — S88119A Complete traumatic amputation at level between knee and ankle, unspecified lower leg, initial encounter: Secondary | ICD-10-CM | POA: Insufficient documentation

## 2012-01-26 DIAGNOSIS — Z79899 Other long term (current) drug therapy: Secondary | ICD-10-CM | POA: Insufficient documentation

## 2012-01-26 DIAGNOSIS — E119 Type 2 diabetes mellitus without complications: Secondary | ICD-10-CM | POA: Insufficient documentation

## 2012-01-26 DIAGNOSIS — N390 Urinary tract infection, site not specified: Secondary | ICD-10-CM

## 2012-01-26 LAB — CBC WITH DIFFERENTIAL/PLATELET
Eosinophils Relative: 1 % (ref 0–5)
HCT: 42.8 % (ref 39.0–52.0)
Hemoglobin: 15.2 g/dL (ref 13.0–17.0)
Lymphocytes Relative: 14 % (ref 12–46)
Lymphs Abs: 2.2 10*3/uL (ref 0.7–4.0)
MCH: 30.9 pg (ref 26.0–34.0)
MCV: 87 fL (ref 78.0–100.0)
Monocytes Absolute: 1.7 10*3/uL — ABNORMAL HIGH (ref 0.1–1.0)
Monocytes Relative: 11 % (ref 3–12)
RBC: 4.92 MIL/uL (ref 4.22–5.81)
WBC: 16.5 10*3/uL — ABNORMAL HIGH (ref 4.0–10.5)

## 2012-01-26 LAB — BASIC METABOLIC PANEL
BUN: 20 mg/dL (ref 6–23)
CO2: 30 mEq/L (ref 19–32)
Calcium: 9.8 mg/dL (ref 8.4–10.5)
Creatinine, Ser: 0.62 mg/dL (ref 0.50–1.35)
Glucose, Bld: 119 mg/dL — ABNORMAL HIGH (ref 70–99)
Sodium: 141 mEq/L (ref 135–145)

## 2012-01-26 LAB — URINALYSIS, ROUTINE W REFLEX MICROSCOPIC
Bilirubin Urine: NEGATIVE
Ketones, ur: NEGATIVE mg/dL
Nitrite: NEGATIVE
pH: 8.5 — ABNORMAL HIGH (ref 5.0–8.0)

## 2012-01-26 MED ORDER — CIPROFLOXACIN HCL 500 MG PO TABS: 500.0000 mg | ORAL_TABLET | Freq: Two times a day (BID) | ORAL | Status: AC

## 2012-01-26 MED ORDER — CIPROFLOXACIN HCL 500 MG PO TABS
500.0000 mg | ORAL_TABLET | Freq: Once | ORAL | Status: AC
  Administered 2012-01-26: 500 mg via ORAL
  Filled 2012-01-26: qty 1

## 2012-01-26 NOTE — ED Notes (Signed)
[id number]<NW> Expected date:01/26/12<BR> Expected time: 3:27 PM<BR> Means of arrival:Ambulance<BR> Comments:<BR> Foley clogged

## 2012-01-26 NOTE — ED Notes (Signed)
Bladder Scanner volume stated no more volume than 36mL in bladder. Pt refusing blood work at this time.

## 2012-01-26 NOTE — ED Notes (Signed)
[name] reports that pt's suprapubic catheter is clogged and pt has had low urinary output. No pain upon palpation. Facility reports distended abdomen. Alert and oriented per baseline.

## 2012-01-26 NOTE — ED Notes (Signed)
Went to collect labs - pt refused.  

## 2012-01-26 NOTE — ED Provider Notes (Addendum)
History     CSN: [id number]  Arrival date & time 01/26/12  1534   First MD Initiated Contact with Patient 01/26/12 1559      Chief Complaint  Patient presents with  . Foley Clogged     (Consider location/radiation/quality/duration/timing/severity/associated sxs/prior treatment) The history is provided by the patient.   patient here with suprapubic catheter dysfunction and low urinary output. Patient denies any symptoms at this time. Denies any flank pain or fever. No treatment done prior to arrival. Nothing makes her symptoms better or worse  Past Medical History  Diagnosis Date  . Diabetes mellitus   . Orchitis and epididymitis in disease classified elsewhere   . Unspecified intestinal obstruction   . Unspecified osteomyelitis, site unspecified   . Encounter for long-term (current) use of other medications   . Hypertrophy of prostate without urinary obstruction and other lower urinary tract symptoms (LUTS)   . Depressive disorder, not elsewhere classified   . Edema of penis   . Pulmonary congestion and hypostasis   . Other ascites   . Thrombocytopenia   . Urinary tract infection, site not specified   . Chronic ulcer of other specified site   . Acute, but ill-defined, cerebrovascular disease   . Intestinal infection due to E. coli, unspecified   . Other convulsions   . Unspecified essential hypertension   . Urinary tract infection, site not specified   . Acute upper respiratory infections of unspecified site   . Other chronic pain   . Unspecified psychosis   . Unspecified constipation   . Acute kidney failure, unspecified     Past Surgical History  Procedure Date  . 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  History  Substance Use Topics  . Smoking status: Not on file  . Smokeless tobacco: Not on file  . Alcohol Use: No      Review of Systems  All other systems reviewed and are negative.    Allergies   Heparin  Home Medications   Current Outpatient Rx  Name Route Sig Dispense Refill  . BISACODYL 10 MG RE SUPP Rectal Place 10 mg rectally as needed. constpation    . VITAMIN D 1000 UNITS PO TABS Oral Take 1,000 Units by mouth daily.    Marland Kitchen LEVETIRACETAM 100 MG/ML PO SOLN Oral Take 300 mg by mouth 2 (two) times daily.     Marland Kitchen LORAZEPAM 2 MG/ML PO CONC Oral Take 1 mg by mouth as needed. For seizure activity    . MAGNESIUM OXIDE 400 MG PO TABS Oral Take 400 mg by mouth daily.    . ADULT MULTIVITAMIN W/MINERALS CH Oral Take 1 tablet by mouth daily.    Marland Kitchen PHENYTOIN SODIUM EXTENDED 100 MG PO CAPS Oral Take 200 mg by mouth 2 (two) times daily.     Marland Kitchen POLYETHYLENE GLYCOL 3350 PO POWD Oral Take 17 g by mouth daily as needed. constipation    . RANITIDINE HCL 150 MG PO TABS Oral Take 150 mg by mouth at bedtime.    Marland Kitchen SIMVASTATIN 40 MG PO TABS Oral Take 40 mg by mouth every evening.    Marland Kitchen VITAMIN C 500 MG PO TABS Oral Take 500 mg by mouth 2 (two) times daily.       BP 103/70  Pulse 108  Temp 98.6 F (37 C) (Oral)  Resp 16  SpO2 94%  Physical Exam  Nursing note and vitals  reviewed. Constitutional: He is oriented to person, place, and time. He appears well-developed and well-nourished.  Non-toxic appearance. No distress.  HENT:  Head: Normocephalic and atraumatic.  Eyes: Conjunctivae, EOM and lids are normal. Pupils are equal, round, and reactive to light.  Neck: Normal range of motion. Neck supple. No tracheal deviation present. No mass present.  Cardiovascular: Normal rate, regular rhythm and normal heart sounds.  Exam reveals no gallop.   No murmur heard. Pulmonary/Chest: Effort normal and breath sounds normal. No stridor. No respiratory distress. He has no decreased breath sounds. He has no wheezes. He has no rhonchi. He has no rales.  Abdominal: Soft. Normal appearance and bowel sounds are normal. He exhibits no distension. There is no tenderness. There is no rigidity, no rebound, no guarding and  no CVA tenderness.       Suprapubic catheter intact  Musculoskeletal: Normal range of motion. He exhibits no edema and no tenderness.  Neurological: He is alert and oriented to person, place, and time. GCS eye subscore is 4. GCS verbal subscore is 5. GCS motor subscore is 6.  Skin: Skin is warm and dry. No abrasion and no rash noted.  Psychiatric: He has a normal mood and affect. His speech is normal and behavior is normal.    ED Course  Procedures (including critical care time)  Labs Reviewed  URINALYSIS, ROUTINE W REFLEX MICROSCOPIC - Abnormal; Notable for the following:    APPearance TURBID (*)     pH 8.5 (*)     Glucose, UA 100 (*)     Hgb urine dipstick TRACE (*)     Protein, ur 100 (*)     Leukocytes, UA LARGE (*)     All other components within normal limits  URINE MICROSCOPIC-ADD ON - Abnormal; Notable for the following:    Bacteria, UA MANY (*)     Crystals TRIPLE PHOSPHATE CRYSTALS (*)     All other components within normal limits  CBC WITH DIFFERENTIAL - Abnormal; Notable for the following:    WBC 16.5 (*)     Neutro Abs 12.4 (*)     Monocytes Absolute 1.7 (*)     All other components within normal limits  BASIC METABOLIC PANEL - Abnormal; Notable for the following:    Glucose, Bld 119 (*)     All other components within normal limits  URINE CULTURE   No results found.   No diagnosis found.    MDM  Pt given cipro for his uti, bladder scan shows no signs of urinary retention, renal fx nl, will tx for uti and pt to f/u his urologist       Toy Baker, MD 01/26/12 2249  Toy Baker, MD 02/14/12 [phone]

## 2012-01-26 NOTE — ED Notes (Signed)
Patient refused blood draw for labs. RN made aware 

## 2012-01-28 LAB — URINE CULTURE: Colony Count: 65000

## 2012-04-03 ENCOUNTER — Encounter (HOSPITAL_COMMUNITY): Payer: Self-pay | Admitting: Emergency Medicine

## 2012-04-03 ENCOUNTER — Emergency Department (HOSPITAL_COMMUNITY): Payer: Medicare Other

## 2012-04-03 ENCOUNTER — Emergency Department (HOSPITAL_COMMUNITY)
Admission: EM | Admit: 2012-04-03 | Discharge: 2012-04-04 | Disposition: A | Discharge status: Skilled Nursing Facility | Payer: Medicare Other | Attending: Emergency Medicine | Admitting: Emergency Medicine

## 2012-04-03 DIAGNOSIS — Z79899 Other long term (current) drug therapy: Secondary | ICD-10-CM | POA: Insufficient documentation

## 2012-04-03 DIAGNOSIS — R339 Retention of urine, unspecified: Secondary | ICD-10-CM | POA: Insufficient documentation

## 2012-04-03 DIAGNOSIS — R142 Eructation: Secondary | ICD-10-CM | POA: Insufficient documentation

## 2012-04-03 DIAGNOSIS — R141 Gas pain: Secondary | ICD-10-CM | POA: Insufficient documentation

## 2012-04-03 DIAGNOSIS — F329 Major depressive disorder, single episode, unspecified: Secondary | ICD-10-CM | POA: Insufficient documentation

## 2012-04-03 DIAGNOSIS — M869 Osteomyelitis, unspecified: Secondary | ICD-10-CM | POA: Insufficient documentation

## 2012-04-03 DIAGNOSIS — Z8673 Personal history of transient ischemic attack (TIA), and cerebral infarction without residual deficits: Secondary | ICD-10-CM | POA: Insufficient documentation

## 2012-04-03 DIAGNOSIS — N390 Urinary tract infection, site not specified: Secondary | ICD-10-CM

## 2012-04-03 DIAGNOSIS — E1169 Type 2 diabetes mellitus with other specified complication: Secondary | ICD-10-CM | POA: Insufficient documentation

## 2012-04-03 DIAGNOSIS — R109 Unspecified abdominal pain: Secondary | ICD-10-CM | POA: Insufficient documentation

## 2012-04-03 DIAGNOSIS — M9080 Osteopathy in diseases classified elsewhere, unspecified site: Secondary | ICD-10-CM | POA: Insufficient documentation

## 2012-04-03 DIAGNOSIS — S88119A Complete traumatic amputation at level between knee and ankle, unspecified lower leg, initial encounter: Secondary | ICD-10-CM | POA: Insufficient documentation

## 2012-04-03 DIAGNOSIS — T83091A Other mechanical complication of indwelling urethral catheter, initial encounter: Secondary | ICD-10-CM

## 2012-04-03 DIAGNOSIS — F3289 Other specified depressive episodes: Secondary | ICD-10-CM | POA: Insufficient documentation

## 2012-04-03 LAB — CBC WITH DIFFERENTIAL/PLATELET
Eosinophils Absolute: 0.2 10*3/uL (ref 0.0–0.7)
Eosinophils Relative: 2 % (ref 0–5)
Lymphs Abs: 2.6 10*3/uL (ref 0.7–4.0)
MCH: 30.1 pg (ref 26.0–34.0)
MCHC: 34.8 g/dL (ref 30.0–36.0)
MCV: 86.7 fL (ref 78.0–100.0)
Platelets: 168 10*3/uL (ref 150–400)
RBC: 4.58 MIL/uL (ref 4.22–5.81)

## 2012-04-03 LAB — POCT I-STAT, CHEM 8
Creatinine, Ser: 0.8 mg/dL (ref 0.50–1.35)
Glucose, Bld: 120 mg/dL — ABNORMAL HIGH (ref 70–99)
Hemoglobin: 13.9 g/dL (ref 13.0–17.0)
Sodium: 141 mEq/L (ref 135–145)
TCO2: 27 mmol/L (ref 0–100)

## 2012-04-03 MED ORDER — MORPHINE SULFATE 4 MG/ML IJ SOLN: 4.0000 mg | Freq: Once | INTRAMUSCULAR | Status: DC

## 2012-04-03 MED ORDER — MORPHINE SULFATE 4 MG/ML IJ SOLN
4.0000 mg | Freq: Once | INTRAMUSCULAR | Status: DC
  Filled 2012-04-03: qty 1

## 2012-04-03 NOTE — ED Notes (Addendum)
Per EMS: Facility reports that catheter has not had any urine output since yesterday.

## 2012-04-03 NOTE — ED Notes (Signed)
WUJ:[id number]<JY> Expected date:04/03/12<BR> Expected time: 8:09 PM<BR> Means of arrival:Ambulance<BR> Comments:<BR> Catheter problems

## 2012-04-03 NOTE — ED Notes (Signed)
PA informed that catheter and other supplies in patients room for him.

## 2012-04-03 NOTE — ED Notes (Signed)
Suprapubic catheter bulb checked, bulb is inflated fully. Insertion site painful to pt, sts it has been for a while. Irrigation of suprapubic catheter attempted, no success, and painful to pt as well.

## 2012-04-03 NOTE — ED Provider Notes (Signed)
History     CSN: [id number]  Arrival date & time 04/03/12  2016   First MD Initiated Contact with Patient 04/03/12 2031      Chief Complaint  Patient presents with  . Urine Output    (Consider location/radiation/quality/duration/timing/severity/associated sxs/prior treatment) HPI  66 year old male with long term indwelling suprapubic foley catheter presents for evaluation of low urine output.  [facility]  Pt send to ER because he has not had adequate urine output from foley since yesterday according to nursing report.  He currently has no complaint, specifically no n/v, abd pain, fever, chills.    Past Medical History  Diagnosis Date  . Diabetes mellitus   . Orchitis and epididymitis in disease classified elsewhere   . Unspecified intestinal obstruction   . Unspecified osteomyelitis, site unspecified   . Encounter for long-term (current) use of other medications   . Hypertrophy of prostate without urinary obstruction and other lower urinary tract symptoms (LUTS)   . Depressive disorder, not elsewhere classified   . Edema of penis   . Pulmonary congestion and hypostasis   . Other ascites   . Thrombocytopenia   . Urinary tract infection, site not specified   . Chronic ulcer of other specified site   . Acute, but ill-defined, cerebrovascular disease   . Intestinal infection due to E. coli, unspecified   . Other convulsions   . Unspecified essential hypertension   . Urinary tract infection, site not specified   . Acute upper respiratory infections of unspecified site   . Other chronic pain   . Unspecified psychosis   . Unspecified constipation   . Acute kidney failure, unspecified     Past Surgical History  Procedure Date  . 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  History  Substance Use Topics  . Smoking status: Not on file  . Smokeless tobacco: Not on file  . Alcohol Use: No      Review of  Systems  Constitutional: Negative for fever.  Cardiovascular: Negative for chest pain.  Gastrointestinal: Positive for abdominal distention. Negative for abdominal pain.  Genitourinary: Positive for decreased urine volume. Negative for dysuria.  All other systems reviewed and are negative.    Allergies  Heparin  Home Medications   Current Outpatient Rx  Name Route Sig Dispense Refill  . BISACODYL 10 MG RE SUPP Rectal Place 10 mg rectally as needed. constpation    . VITAMIN D 1000 UNITS PO TABS Oral Take 1,000 Units by mouth daily.    Marland Kitchen LEVETIRACETAM 100 MG/ML PO SOLN Oral Take 300 mg by mouth 2 (two) times daily.     Marland Kitchen LORAZEPAM 2 MG/ML PO CONC Oral Take 1 mg by mouth as needed. For seizure activity    . MAGNESIUM OXIDE 400 MG PO TABS Oral Take 400 mg by mouth daily.    . ADULT MULTIVITAMIN W/MINERALS CH Oral Take 1 tablet by mouth daily.    Marland Kitchen PHENYTOIN SODIUM EXTENDED 100 MG PO CAPS Oral Take 200 mg by mouth 2 (two) times daily.     Marland Kitchen POLYETHYLENE GLYCOL 3350 PO POWD Oral Take 17 g by mouth daily as needed. constipation    . RANITIDINE HCL 150 MG PO TABS Oral Take 150 mg by mouth at bedtime.    Marland Kitchen SIMVASTATIN 40 MG PO TABS Oral Take 40 mg by mouth every evening.    Marland Kitchen  VITAMIN C 500 MG PO TABS Oral Take 500 mg by mouth 2 (two) times daily.       BP 126/87  Pulse 81  Temp 97.7 F (36.5 C) (Axillary)  Resp 18  SpO2 95%  Physical Exam  Nursing note and vitals reviewed. Constitutional: He appears well-developed and well-nourished.       Speech hard to understand  HENT:  Head: Normocephalic and atraumatic.  Mouth/Throat: Oropharynx is clear and moist.  Eyes: Conjunctivae normal are normal.  Neck: Neck supple.  Abdominal: He exhibits distension.       abd distended, nontender on exam.    Suprapubic catheter appears to be in place, no urine output in foley bag.    Genitourinary:       Wearing adult diaper  Musculoskeletal:       Bilateral AKA    ED Course  SUPRAPUBIC  TUBE PLACEMENT Date/Time: 04/04/2012 2:04 AM Performed by: Fayrene Helper Authorized by: Fayrene Helper Consent: Verbal consent obtained. Risks and benefits: risks, benefits and alternatives were discussed Consent given by: patient Patient understanding: patient states understanding of the procedure being performed Patient consent: the patient's understanding of the procedure matches consent given Patient identity confirmed: verbally with patient and arm band Indications: catheter change Preparation: Patient was prepped and draped in the usual sterile fashion. Suprapubic aspiration by: catheter Catheter type: Foley Catheter size: 14 Fr Number of attempts: 1 Urine volume: 30 ml Urine characteristics: blood-tinged Patient tolerance: Patient tolerated the procedure well with no immediate complications.    Results for orders placed during the hospital encounter of 04/03/12  CBC WITH DIFFERENTIAL      Component Value Range   WBC 9.8  4.0 - 10.5 K/uL   RBC 4.58  4.22 - 5.81 MIL/uL   Hemoglobin 13.8  13.0 - 17.0 g/dL   HCT 29.5  62.1 - 30.8 %   MCV 86.7  78.0 - 100.0 fL   MCH 30.1  26.0 - 34.0 pg   MCHC 34.8  30.0 - 36.0 g/dL   RDW 65.7  84.6 - 96.2 %   Platelets 168  150 - 400 K/uL   Neutrophils Relative 57  43 - 77 %   Neutro Abs 5.6  1.7 - 7.7 K/uL   Lymphocytes Relative 26  12 - 46 %   Lymphs Abs 2.6  0.7 - 4.0 K/uL   Monocytes Relative 14 (*) 3 - 12 %   Monocytes Absolute 1.3 (*) 0.1 - 1.0 K/uL   Eosinophils Relative 2  0 - 5 %   Eosinophils Absolute 0.2  0.0 - 0.7 K/uL   Basophils Relative 0  0 - 1 %   Basophils Absolute 0.0  0.0 - 0.1 K/uL  POCT I-STAT, CHEM 8      Component Value Range   Sodium 141  135 - 145 mEq/L   Potassium 3.9  3.5 - 5.1 mEq/L   Chloride 103  96 - 112 mEq/L   BUN 17  6 - 23 mg/dL   Creatinine, Ser 9.52  0.50 - 1.35 mg/dL   Glucose, Bld 841 (*) 70 - 99 mg/dL   Calcium, Ion 3.24  4.01 - 1.30 mmol/L   TCO2 27  0 - 100 mmol/L   Hemoglobin 13.9  13.0 -  17.0 g/dL   HCT 02.7  25.3 - 66.4 %  URINALYSIS, ROUTINE W REFLEX MICROSCOPIC      Component Value Range   Color, Urine YELLOW  YELLOW   APPearance CLOUDY (*) CLEAR  Specific Gravity, Urine >1.046 (*) 1.005 - 1.030   pH 5.5  5.0 - 8.0   Glucose, UA NEGATIVE  NEGATIVE mg/dL   Hgb urine dipstick MODERATE (*) NEGATIVE   Bilirubin Urine NEGATIVE  NEGATIVE   Ketones, ur NEGATIVE  NEGATIVE mg/dL   Protein, ur NEGATIVE  NEGATIVE mg/dL   Urobilinogen, UA 0.2  0.0 - 1.0 mg/dL   Nitrite NEGATIVE  NEGATIVE   Leukocytes, UA LARGE (*) NEGATIVE  URINE MICROSCOPIC-ADD ON      Component Value Range   Squamous Epithelial / LPF RARE  RARE   WBC, UA 21-50  <3 WBC/hpf   RBC / HPF 3-6  <3 RBC/hpf   Bacteria, UA MANY (*) RARE   Ct Abdomen Pelvis W Contrast  04/04/2012  *RADIOLOGY REPORT*  Clinical Data: Abdominal pain  CT ABDOMEN AND PELVIS WITH CONTRAST  Technique:  Multidetector CT imaging of the abdomen and pelvis was performed following the standard protocol during bolus administration of intravenous contrast.  Contrast: OMNIPAQUE IOHEXOL 300 MG/ML  SOLN  Comparison: 06/26/2008  Findings: Small bilateral pleural effusions with associated consolidations.  Coronary artery calcification.  Unremarkable liver, spleen, pancreas, adrenal glands.  Gallstones.  No biliary ductal dilatation.  There is mild right sided hydroureteronephrosis to the level of the bladder.  There is urothelial hyperenhancement of the right ureter. No obstructing lesion identified.  No hydronephrosis or hydroureter on the left.  Large amount stool fills the distal colon.  Redundant sigmoid colon.  Normal appendix.  No bowel obstruction.  No free intraperitoneal air or fluid.  There is a peritoneal shunt catheter which enters via the left hemiabdomen and terminates within the left lower quadrant.  Advanced atherosclerotic disease of the aorta and branch vessels. Infrarenal aortic ectasia.  Circumferential bladder wall thickening is  suboptimally evaluated due to nondistension.  Suprapubic catheter in place.  Diffuse muscular atrophy.  Subcutaneous thickening/irregularity overlies the sacrum and posterior iliac bones as well as right ischium. Sclerotic appearance to the inferior sacrum posteriorly as seen on series 2 image 69.  Multilevel degenerative changes.  IMPRESSION: Right-sided hydroureternephrosis and delayed excretion.  The bladder is circumferentially thickened.  Findings suggest chronic cystitis with ascending right-sided  infection. A distal right ureteral stricture is also a consideration.  Large distal colonic stool burden.  Stercoral colitis not excluded.  Sacral decubitus ulcers and sclerotic appearance to the posterior aspect of the sacrum appear similar to prior.  Small bilateral pleural effusions with associated consolidations; atelectasis versus infiltrate.   Original Report Authenticated By: Waneta Martins, M.D.     1. Foley catheter dysfunction 2. UTI  MDM  Decreased urine output from long term indwelling suprapubic foley cath.  Will check renal function.  Will also obtain bladder scan, flush foley catheter, and may consider xray to check for placement.  Pt currently without acute distress.     9:36 PM Pt's adult diaper were saturated with urine.  Bladder scan yield zero urine in bladder.  Abd nontender on palpation.  Will flush foley catheter and will monitor for urine output.     10:07 PM Pt is urinate through penis in the room.  Nurse attempt to flush catheter but meets resistance.  Will consider replacement of suprapubic catheter.    11:29 PM On reevaluation with my attending, pt endorse abd pain.  Pain is diffused and started 1 hr ago.  Since pt's abdomen is distended, and he has hx of multp abd surgery, will obtain abd/pelvic CT for further evaluation.  My attending agrees.  Pain medication given.    2:03 AM Suprapubic catheter was successfully replaced by me.  Positive urine return.    5:11  AM CT scan shows evidence of chronic cystitis with ascending R-side infection.  UA with many WBC and bacteria, however nitrite positive.  Urine culture sent.  Will also prescribe Keflex as treatment.  Otherwise, pt stable to be discharge.  Pt currently pain free.    Fayrene Helper, PA-C 04/04/12 [phone]

## 2012-04-04 ENCOUNTER — Emergency Department (HOSPITAL_COMMUNITY): Payer: Medicare Other

## 2012-04-04 LAB — URINALYSIS, ROUTINE W REFLEX MICROSCOPIC
Bilirubin Urine: NEGATIVE
Protein, ur: NEGATIVE mg/dL
Urobilinogen, UA: 0.2 mg/dL (ref 0.0–1.0)

## 2012-04-04 LAB — URINE MICROSCOPIC-ADD ON

## 2012-04-04 MED ORDER — CEPHALEXIN 500 MG PO CAPS: 500.0000 mg | ORAL_CAPSULE | Freq: Three times a day (TID) | ORAL | Status: DC

## 2012-04-04 MED ORDER — CEPHALEXIN 500 MG PO CAPS
500.0000 mg | ORAL_CAPSULE | Freq: Once | ORAL | Status: AC
  Administered 2012-04-04: 500 mg via ORAL
  Filled 2012-04-04: qty 1

## 2012-04-04 MED ORDER — IOHEXOL 300 MG/ML  SOLN
100.0000 mL | Freq: Once | INTRAMUSCULAR | Status: AC | PRN
  Administered 2012-04-04: 100 mL via INTRAVENOUS

## 2012-04-04 NOTE — ED Notes (Signed)
Report called to Pt's RN at [facility].

## 2012-04-04 NOTE — ED Notes (Signed)
Ptar called to transport patient back to [facility]

## 2012-04-06 NOTE — ED Provider Notes (Signed)
Medical screening examination/treatment/procedure(s) were conducted as a shared visit with non-physician practitioner(s) and myself.  I personally evaluated the patient during the encounter   Loren Racer, MD 04/06/12 [phone]

## 2012-04-08 LAB — URINE CULTURE

## 2012-04-09 NOTE — ED Notes (Signed)
Copy of labs faxed to [facility]. 

## 2012-04-25 ENCOUNTER — Encounter (HOSPITAL_COMMUNITY): Payer: Self-pay | Admitting: *Deleted

## 2012-04-25 ENCOUNTER — Emergency Department (HOSPITAL_COMMUNITY)
Admission: EM | Admit: 2012-04-25 | Discharge: 2012-04-25 | Disposition: A | Discharge status: Home / Self Care | Payer: Medicare Other | Attending: Emergency Medicine | Admitting: Emergency Medicine

## 2012-04-25 ENCOUNTER — Emergency Department (HOSPITAL_COMMUNITY): Payer: Medicare Other

## 2012-04-25 DIAGNOSIS — Y846 Urinary catheterization as the cause of abnormal reaction of the patient, or of later complication, without mention of misadventure at the time of the procedure: Secondary | ICD-10-CM | POA: Insufficient documentation

## 2012-04-25 DIAGNOSIS — Z79899 Other long term (current) drug therapy: Secondary | ICD-10-CM | POA: Insufficient documentation

## 2012-04-25 DIAGNOSIS — F3289 Other specified depressive episodes: Secondary | ICD-10-CM | POA: Insufficient documentation

## 2012-04-25 DIAGNOSIS — N390 Urinary tract infection, site not specified: Secondary | ICD-10-CM | POA: Insufficient documentation

## 2012-04-25 DIAGNOSIS — F329 Major depressive disorder, single episode, unspecified: Secondary | ICD-10-CM | POA: Insufficient documentation

## 2012-04-25 DIAGNOSIS — D696 Thrombocytopenia, unspecified: Secondary | ICD-10-CM | POA: Insufficient documentation

## 2012-04-25 DIAGNOSIS — Z435 Encounter for attention to cystostomy: Secondary | ICD-10-CM | POA: Insufficient documentation

## 2012-04-25 DIAGNOSIS — T839XXA Unspecified complication of genitourinary prosthetic device, implant and graft, initial encounter: Secondary | ICD-10-CM

## 2012-04-25 DIAGNOSIS — Z8739 Personal history of other diseases of the musculoskeletal system and connective tissue: Secondary | ICD-10-CM | POA: Insufficient documentation

## 2012-04-25 DIAGNOSIS — I10 Essential (primary) hypertension: Secondary | ICD-10-CM | POA: Insufficient documentation

## 2012-04-25 DIAGNOSIS — N179 Acute kidney failure, unspecified: Secondary | ICD-10-CM | POA: Insufficient documentation

## 2012-04-25 DIAGNOSIS — N400 Enlarged prostate without lower urinary tract symptoms: Secondary | ICD-10-CM | POA: Insufficient documentation

## 2012-04-25 DIAGNOSIS — E119 Type 2 diabetes mellitus without complications: Secondary | ICD-10-CM | POA: Insufficient documentation

## 2012-04-25 LAB — BASIC METABOLIC PANEL
BUN: 20 mg/dL (ref 6–23)
CO2: 28 mEq/L (ref 19–32)
Calcium: 9.6 mg/dL (ref 8.4–10.5)
Creatinine, Ser: 0.55 mg/dL (ref 0.50–1.35)
Glucose, Bld: 118 mg/dL — ABNORMAL HIGH (ref 70–99)

## 2012-04-25 LAB — CBC WITH DIFFERENTIAL/PLATELET
Basophils Absolute: 0 10*3/uL (ref 0.0–0.1)
Eosinophils Relative: 3 % (ref 0–5)
HCT: 39.9 % (ref 39.0–52.0)
Lymphocytes Relative: 39 % (ref 12–46)
MCV: 86.2 fL (ref 78.0–100.0)
Monocytes Absolute: 1 10*3/uL (ref 0.1–1.0)
Monocytes Relative: 15 % — ABNORMAL HIGH (ref 3–12)
RDW: 12.6 % (ref 11.5–15.5)
WBC: 6.3 10*3/uL (ref 4.0–10.5)

## 2012-04-25 LAB — URINALYSIS, ROUTINE W REFLEX MICROSCOPIC
Glucose, UA: 250 mg/dL — AB
Hgb urine dipstick: NEGATIVE
Ketones, ur: NEGATIVE mg/dL
Protein, ur: >300 mg/dL — AB

## 2012-04-25 MED ORDER — SODIUM CHLORIDE 0.9 % IV SOLN
INTRAVENOUS | Status: DC
  Administered 2012-04-25 (×2): via INTRAVENOUS

## 2012-04-25 NOTE — ED Notes (Signed)
Bladder completed  43 ml of urine

## 2012-04-25 NOTE — ED Notes (Signed)
Patient transported to X-ray 

## 2012-04-25 NOTE — ED Notes (Signed)
Bed:WA10<BR> Expected date:<BR> Expected time:<BR> Means of arrival:<BR> Comments:<BR> EMS

## 2012-04-25 NOTE — ED Notes (Signed)
PTAR  Called for pick up and transport to [facility]

## 2012-04-25 NOTE — ED Notes (Signed)
Suprapubic catheter replaced with same size23french

## 2012-04-25 NOTE — ED Notes (Signed)
No answer at [facility],  Attempted report, catheter replaced,

## 2012-04-25 NOTE — ED Provider Notes (Signed)
History     CSN: [id number]  Arrival date & time 04/25/12  1940   First MD Initiated Contact with Patient 04/25/12 2010      Chief Complaint  Patient presents with  . Urine Output    suprapubic catheter problems    (Consider location/radiation/quality/duration/timing/severity/associated sxs/prior treatment) The history is provided by the patient.   Patient here with leakage around the suprapubic catheter. Notes abdominal distention with abdominal pain. No fever, vomiting, diarrhea. No urine appreciated in his catheter. Nothing makes her symptoms better worse. No treatment used prior to arrival. Past Medical History  Diagnosis Date  . Diabetes mellitus   . Orchitis and epididymitis in disease classified elsewhere   . Unspecified intestinal obstruction   . Unspecified osteomyelitis, site unspecified   . Encounter for long-term (current) use of other medications   . Hypertrophy of prostate without urinary obstruction and other lower urinary tract symptoms (LUTS)   . Depressive disorder, not elsewhere classified   . Edema of penis   . Pulmonary congestion and hypostasis   . Other ascites   . Thrombocytopenia   . Urinary tract infection, site not specified   . Chronic ulcer of other specified site   . Acute, but ill-defined, cerebrovascular disease   . Intestinal infection due to E. coli, unspecified   . Other convulsions   . Unspecified essential hypertension   . Urinary tract infection, site not specified   . Acute upper respiratory infections of unspecified site   . Other chronic pain   . Unspecified psychosis   . Unspecified constipation   . Acute kidney failure, unspecified     Past Surgical History  Procedure Date  . 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 No pertinent family history.  History  Substance Use Topics  . Smoking status: Not on file  . Smokeless tobacco: Not on file  . Alcohol Use: No      Review of  Systems  All other systems reviewed and are negative.    Allergies  Heparin  Home Medications   Current Outpatient Rx  Name Route Sig Dispense Refill  . BISACODYL 10 MG RE SUPP Rectal Place 10 mg rectally daily as needed. For constipation.    Marland Kitchen VITAMIN D 1000 UNITS PO TABS Oral Take 1,000 Units by mouth daily.    Marland Kitchen PRO-STAT 64 PO LIQD Oral Take 30 mLs by mouth 3 (three) times daily with meals.    Marland Kitchen LEVETIRACETAM 100 MG/ML PO SOLN Oral Take 300 mg by mouth 2 (two) times daily.     Marland Kitchen LORAZEPAM 2 MG/ML IJ SOLN Intramuscular Inject 1 mg into the muscle daily as needed. For seizure activity; may repeat once 30 minutes after.    Marland Kitchen MAGNESIUM OXIDE 400 MG PO TABS Oral Take 400 mg by mouth daily.    . ADULT MULTIVITAMIN W/MINERALS CH Oral Take 2 tablets by mouth daily.     . OXYBUTYNIN CHLORIDE 5 MG PO TABS Oral Take 5 mg by mouth 2 (two) times daily.    Marland Kitchen PHENYTOIN SODIUM EXTENDED 100 MG PO CAPS Oral Take 100 mg by mouth 2 (two) times daily.     Marland Kitchen POLYETHYLENE GLYCOL 3350 PO POWD Oral Take 17 g by mouth daily as needed. constipation    . RANITIDINE HCL 150 MG PO TABS Oral Take 150 mg by mouth at bedtime.    Marland Kitchen SIMVASTATIN 40 MG PO TABS Oral Take 40 mg  by mouth every evening.    . SORBITOL 70 % PO SOLN Oral Take 30 mLs by mouth daily as needed. For constipation; may repeat once in 2 hours.      BP 135/71  Pulse 65  Temp 98.1 F (36.7 C) (Oral)  Resp 20  SpO2 97%  Physical Exam  Nursing note and vitals reviewed. Constitutional: He is oriented to person, place, and time. Vital signs are normal. He appears well-developed and well-nourished.  Non-toxic appearance. No distress.  HENT:  Head: Normocephalic and atraumatic.       Chapped lips  Eyes: Conjunctivae normal, EOM and lids are normal. Pupils are equal, round, and reactive to light.  Neck: Normal range of motion. Neck supple. No tracheal deviation present. No mass present.  Cardiovascular: Normal rate, regular rhythm and normal heart  sounds.  Exam reveals no gallop.   No murmur heard. Pulmonary/Chest: Effort normal and breath sounds normal. No stridor. No respiratory distress. He has no decreased breath sounds. He has no wheezes. He has no rhonchi. He has no rales.  Abdominal: Soft. Normal appearance and bowel sounds are normal. He exhibits distension. He exhibits no fluid wave. There is no tenderness. There is no rigidity, no rebound, no guarding and no CVA tenderness.  Musculoskeletal: Normal range of motion. He exhibits no edema and no tenderness.       Bilateral bka  Neurological: He is alert and oriented to person, place, and time. He has normal strength. No cranial nerve deficit or sensory deficit. GCS eye subscore is 4. GCS verbal subscore is 5. GCS motor subscore is 6.  Skin: Skin is warm and dry. No abrasion and no rash noted.       Dry skin  Psychiatric: He has a normal mood and affect. His speech is normal and behavior is normal.    ED Course  Procedures (including critical care time)   Labs Reviewed  CBC WITH DIFFERENTIAL  BASIC METABOLIC PANEL  URINALYSIS, ROUTINE W REFLEX MICROSCOPIC  URINE CULTURE   No results found.   No diagnosis found.    MDM  Urine culture obtained and suspect the current urine is contaminated. Foley replaced by nursing. Good urine flow this time. Patient to be sent back to the nursing home        Toy Baker, MD 04/25/12 2249

## 2012-04-25 NOTE — ED Notes (Signed)
Patient returned from X-ray 

## 2012-04-25 NOTE — ED Notes (Signed)
Pt arrived via EMS from [facility] he has history of frequent UTI's  Has suparpubic catheter, pt is double amputee and also has leaking around suprapubic catheter

## 2012-04-29 LAB — URINE CULTURE: Colony Count: >=100000

## 2012-05-01 ENCOUNTER — Emergency Department (HOSPITAL_COMMUNITY)
Admission: EM | Admit: 2012-05-01 | Discharge: 2012-05-01 | Disposition: A | Discharge status: Skilled Nursing Facility | Payer: Medicare Other | Attending: Emergency Medicine | Admitting: Emergency Medicine

## 2012-05-01 ENCOUNTER — Encounter (HOSPITAL_COMMUNITY): Payer: Self-pay | Admitting: *Deleted

## 2012-05-01 DIAGNOSIS — T8389XA Other specified complication of genitourinary prosthetic devices, implants and grafts, initial encounter: Secondary | ICD-10-CM | POA: Insufficient documentation

## 2012-05-01 DIAGNOSIS — R569 Unspecified convulsions: Secondary | ICD-10-CM | POA: Insufficient documentation

## 2012-05-01 DIAGNOSIS — I10 Essential (primary) hypertension: Secondary | ICD-10-CM | POA: Insufficient documentation

## 2012-05-01 DIAGNOSIS — F3289 Other specified depressive episodes: Secondary | ICD-10-CM | POA: Insufficient documentation

## 2012-05-01 DIAGNOSIS — T83010A Breakdown (mechanical) of cystostomy catheter, initial encounter: Secondary | ICD-10-CM

## 2012-05-01 DIAGNOSIS — Z79899 Other long term (current) drug therapy: Secondary | ICD-10-CM | POA: Insufficient documentation

## 2012-05-01 DIAGNOSIS — Y846 Urinary catheterization as the cause of abnormal reaction of the patient, or of later complication, without mention of misadventure at the time of the procedure: Secondary | ICD-10-CM | POA: Insufficient documentation

## 2012-05-01 DIAGNOSIS — E119 Type 2 diabetes mellitus without complications: Secondary | ICD-10-CM | POA: Insufficient documentation

## 2012-05-01 DIAGNOSIS — F329 Major depressive disorder, single episode, unspecified: Secondary | ICD-10-CM | POA: Insufficient documentation

## 2012-05-01 DIAGNOSIS — N179 Acute kidney failure, unspecified: Secondary | ICD-10-CM | POA: Insufficient documentation

## 2012-05-01 DIAGNOSIS — N400 Enlarged prostate without lower urinary tract symptoms: Secondary | ICD-10-CM | POA: Insufficient documentation

## 2012-05-01 NOTE — ED Notes (Signed)
Yellow/straw urine noted in catheter and bag, pt denies any urinary symptoms, denies pain at this time

## 2012-05-01 NOTE — ED Notes (Signed)
Pt to room via EMS due to suprapubic catheter not draining; states has had problems with it draining off and on x 2 days; no drainage last pm, Nursing home changed catheter last pm and still continues with no drainage.

## 2012-05-01 NOTE — ED Notes (Signed)
Suprapubic catheter bladder irrigation preformed, irrigated 30mL NS, 30mL output - clear/yellow urine. Changed catheter tubing/bag. Pt tolerated well

## 2012-05-01 NOTE — ED Notes (Signed)
ZOX:[id number]<AV> Expected date:05/01/12<BR> Expected time: 7:06 AM<BR> Means of arrival:Ambulance<BR> Comments:<BR> Foley catheter clogged

## 2012-05-01 NOTE — ED Provider Notes (Signed)
History     CSN: [id number]  Arrival date & time 05/01/12  0714   First MD Initiated Contact with Patient 05/01/12 [phone]      Chief Complaint  Patient presents with  . Urinary Retention    (Consider location/radiation/quality/duration/timing/severity/associated sxs/prior treatment) HPI Comments: [name] is a 66 y.o. Male reportedly sent for suprapubic catheter dysfunction. His catheter was replaced yesterday at his facility. He is unable to give history. He presents with a urinary catheter in an ostomy in the abdomen wall. It is draining slightly cloudy urine. Is about 100 cc of urine in the bag.  Level V Caveat- Poor Historian  The history is provided by the patient.    Past Medical History  Diagnosis Date  . Diabetes mellitus   . Orchitis and epididymitis in disease classified elsewhere   . Unspecified intestinal obstruction   . Unspecified osteomyelitis, site unspecified   . Encounter for long-term (current) use of other medications   . Hypertrophy of prostate without urinary obstruction and other lower urinary tract symptoms (LUTS)   . Depressive disorder, not elsewhere classified   . Edema of penis   . Pulmonary congestion and hypostasis   . Other ascites   . Thrombocytopenia   . Urinary tract infection, site not specified   . Chronic ulcer of other specified site   . Acute, but ill-defined, cerebrovascular disease   . Intestinal infection due to E. coli, unspecified   . Other convulsions   . Unspecified essential hypertension   . Urinary tract infection, site not specified   . Acute upper respiratory infections of unspecified site   . Other chronic pain   . Unspecified psychosis   . Unspecified constipation   . Acute kidney failure, unspecified     Past Surgical History  Procedure Date  . 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  History  Substance Use Topics  . Smoking status: Not on file  .  Smokeless tobacco: Not on file  . Alcohol Use: No      Review of Systems  Unable to perform ROS   Allergies  Heparin  Home Medications   Current Outpatient Rx  Name Route Sig Dispense Refill  . VITAMIN D 1000 UNITS PO TABS Oral Take 1,000 Units by mouth daily.    Marland Kitchen PRO-STAT 64 PO LIQD Oral Take 30 mLs by mouth 3 (three) times daily with meals.    Marland Kitchen LEVETIRACETAM 100 MG/ML PO SOLN Oral Take 300 mg by mouth 2 (two) times daily.     Marland Kitchen MAGNESIUM OXIDE 400 MG PO TABS Oral Take 400 mg by mouth daily.    . ADULT MULTIVITAMIN W/MINERALS CH Oral Take 2 tablets by mouth daily.     . OXYBUTYNIN CHLORIDE 5 MG PO TABS Oral Take 5 mg by mouth 2 (two) times daily.    Marland Kitchen PHENYTOIN SODIUM EXTENDED 100 MG PO CAPS Oral Take 100 mg by mouth 2 (two) times daily.     Marland Kitchen POLYETHYLENE GLYCOL 3350 PO POWD Oral Take 17 g by mouth daily as needed. constipation    . RANITIDINE HCL 150 MG PO TABS Oral Take 150 mg by mouth at bedtime.    Marland Kitchen SIMVASTATIN 40 MG PO TABS Oral Take 40 mg by mouth every evening.    . SORBITOL 70 % PO SOLN Oral Take 30 mLs by mouth daily as needed. For constipation; may repeat once in  2 hours.    Marland Kitchen BISACODYL 10 MG RE SUPP Rectal Place 10 mg rectally daily as needed. For constipation.    Marland Kitchen LORAZEPAM 2 MG/ML IJ SOLN Intramuscular Inject 1 mg into the muscle daily as needed. For seizure activity; may repeat once 30 minutes after.      BP 108/73  Pulse 77  Temp 98.1 F (36.7 C) (Oral)  Resp 20  SpO2 96%  Physical Exam  Nursing note and vitals reviewed. Constitutional: He is oriented to person, place, and time. He appears well-developed.       Appears older than stated age  HENT:  Head: Normocephalic and atraumatic.  Right Ear: External ear normal.  Left Ear: External ear normal.  Eyes: Conjunctivae normal and EOM are normal. Pupils are equal, round, and reactive to light.  Neck: Normal range of motion and phonation normal. Neck supple.  Cardiovascular: Normal rate, regular  rhythm, normal heart sounds and intact distal pulses.   Pulmonary/Chest: Effort normal and breath sounds normal. He exhibits no bony tenderness.  Abdominal: Soft. Normal appearance. He exhibits no distension. There is no tenderness.       No suprapubic tenderness, or mass  Genitourinary:       Male genitalia: Penis with hypospadius. Testes normal, scrotum normal. No inguinal hernia, or mass. Urinary ostomy site appears, normal.  Musculoskeletal: Normal range of motion.       Bilateral AKA  Neurological: He is alert and oriented to person, place, and time. He has normal strength. No cranial nerve deficit or sensory deficit. He exhibits normal muscle tone. Coordination normal.  Skin: Skin is warm, dry and intact.  Psychiatric: He has a normal mood and affect. His behavior is normal. Judgment and thought content normal.    ED Course  Procedures (including critical care time)  ED orders: Bladder scan, and urinary culture  Bladder scan done by nursing: 0 cc in bladder.  Catheter irrigation by nursing: The catheter function was normal     Labs Reviewed  URINE CULTURE      1. Suprapubic catheter dysfunction       MDM  Report of catheter dysfunction, with an apparently normal functioning catheter. Bladder scan- negative. Urine culture pending. Recent urinary tract infection was treated with Keflex. Doubt metabolic instability, serious bacterial infection or impending vascular collapse; the patient is stable for discharge.    Plan: Home Medications- usual; Home Treatments- catheter care; Recommended follow up- PCP prn      Flint Melter, MD 05/01/12 1040

## 2012-05-01 NOTE — ED Notes (Signed)
ptar called for pt transportation to nursing facility  

## 2012-05-03 LAB — URINE CULTURE: Colony Count: 30000

## 2012-05-04 NOTE — ED Notes (Signed)
+   Urine Chart sent to EDP office for review. 

## 2012-05-05 NOTE — ED Notes (Addendum)
Likely  Colonized. No need for further treatment.Per Trixie Dredge PAC.

## 2012-06-30 ENCOUNTER — Encounter (HOSPITAL_COMMUNITY): Payer: Self-pay | Admitting: Emergency Medicine

## 2012-06-30 ENCOUNTER — Emergency Department (HOSPITAL_COMMUNITY): Payer: Medicare Other

## 2012-06-30 ENCOUNTER — Inpatient Hospital Stay (HOSPITAL_COMMUNITY)
Admission: EM | Admit: 2012-06-30 | Discharge: 2012-07-09 | DRG: 871 | Disposition: A | Discharge status: Skilled Nursing Facility | Payer: Medicare Other | Attending: Family Medicine | Admitting: Family Medicine

## 2012-06-30 DIAGNOSIS — D696 Thrombocytopenia, unspecified: Secondary | ICD-10-CM | POA: Diagnosis present

## 2012-06-30 DIAGNOSIS — M959 Acquired deformity of musculoskeletal system, unspecified: Secondary | ICD-10-CM | POA: Diagnosis present

## 2012-06-30 DIAGNOSIS — E860 Dehydration: Secondary | ICD-10-CM | POA: Diagnosis present

## 2012-06-30 DIAGNOSIS — R6521 Severe sepsis with septic shock: Secondary | ICD-10-CM

## 2012-06-30 DIAGNOSIS — G9341 Metabolic encephalopathy: Secondary | ICD-10-CM | POA: Diagnosis present

## 2012-06-30 DIAGNOSIS — A4159 Other Gram-negative sepsis: Principal | ICD-10-CM | POA: Diagnosis present

## 2012-06-30 DIAGNOSIS — Z66 Do not resuscitate: Secondary | ICD-10-CM | POA: Diagnosis present

## 2012-06-30 DIAGNOSIS — A419 Sepsis, unspecified organism: Secondary | ICD-10-CM | POA: Diagnosis present

## 2012-06-30 DIAGNOSIS — Z6841 Body Mass Index (BMI) 40.0 and over, adult: Secondary | ICD-10-CM

## 2012-06-30 DIAGNOSIS — I10 Essential (primary) hypertension: Secondary | ICD-10-CM | POA: Diagnosis present

## 2012-06-30 DIAGNOSIS — L98499 Non-pressure chronic ulcer of skin of other sites with unspecified severity: Secondary | ICD-10-CM

## 2012-06-30 DIAGNOSIS — F17200 Nicotine dependence, unspecified, uncomplicated: Secondary | ICD-10-CM

## 2012-06-30 DIAGNOSIS — Z8673 Personal history of transient ischemic attack (TIA), and cerebral infarction without residual deficits: Secondary | ICD-10-CM

## 2012-06-30 DIAGNOSIS — E46 Unspecified protein-calorie malnutrition: Secondary | ICD-10-CM | POA: Diagnosis present

## 2012-06-30 DIAGNOSIS — K117 Disturbances of salivary secretion: Secondary | ICD-10-CM

## 2012-06-30 DIAGNOSIS — K8020 Calculus of gallbladder without cholecystitis without obstruction: Secondary | ICD-10-CM | POA: Diagnosis present

## 2012-06-30 DIAGNOSIS — E7800 Pure hypercholesterolemia, unspecified: Secondary | ICD-10-CM

## 2012-06-30 DIAGNOSIS — D649 Anemia, unspecified: Secondary | ICD-10-CM | POA: Diagnosis present

## 2012-06-30 DIAGNOSIS — D689 Coagulation defect, unspecified: Secondary | ICD-10-CM | POA: Diagnosis present

## 2012-06-30 DIAGNOSIS — R0600 Dyspnea, unspecified: Secondary | ICD-10-CM

## 2012-06-30 DIAGNOSIS — Z79899 Other long term (current) drug therapy: Secondary | ICD-10-CM

## 2012-06-30 DIAGNOSIS — K649 Unspecified hemorrhoids: Secondary | ICD-10-CM

## 2012-06-30 DIAGNOSIS — G40909 Epilepsy, unspecified, not intractable, without status epilepticus: Secondary | ICD-10-CM | POA: Diagnosis present

## 2012-06-30 DIAGNOSIS — F528 Other sexual dysfunction not due to a substance or known physiological condition: Secondary | ICD-10-CM

## 2012-06-30 DIAGNOSIS — M25559 Pain in unspecified hip: Secondary | ICD-10-CM

## 2012-06-30 DIAGNOSIS — J189 Pneumonia, unspecified organism: Secondary | ICD-10-CM | POA: Diagnosis present

## 2012-06-30 DIAGNOSIS — S88119A Complete traumatic amputation at level between knee and ankle, unspecified lower leg, initial encounter: Secondary | ICD-10-CM

## 2012-06-30 DIAGNOSIS — R739 Hyperglycemia, unspecified: Secondary | ICD-10-CM | POA: Diagnosis present

## 2012-06-30 DIAGNOSIS — R627 Adult failure to thrive: Secondary | ICD-10-CM

## 2012-06-30 DIAGNOSIS — E876 Hypokalemia: Secondary | ICD-10-CM | POA: Diagnosis present

## 2012-06-30 DIAGNOSIS — Z982 Presence of cerebrospinal fluid drainage device: Secondary | ICD-10-CM

## 2012-06-30 DIAGNOSIS — M869 Osteomyelitis, unspecified: Secondary | ICD-10-CM

## 2012-06-30 DIAGNOSIS — G9340 Encephalopathy, unspecified: Secondary | ICD-10-CM | POA: Diagnosis present

## 2012-06-30 DIAGNOSIS — Z936 Other artificial openings of urinary tract status: Secondary | ICD-10-CM

## 2012-06-30 DIAGNOSIS — N390 Urinary tract infection, site not specified: Secondary | ICD-10-CM | POA: Diagnosis present

## 2012-06-30 DIAGNOSIS — R569 Unspecified convulsions: Secondary | ICD-10-CM | POA: Diagnosis present

## 2012-06-30 MED ORDER — SODIUM CHLORIDE 0.9 % IV BOLUS (SEPSIS)
1000.0000 mL | Freq: Once | INTRAVENOUS | Status: AC
  Administered 2012-07-01: 1000 mL via INTRAVENOUS

## 2012-06-30 MED ORDER — ACETAMINOPHEN 650 MG RE SUPP: 650.0000 mg | Freq: Once | RECTAL | Status: DC

## 2012-06-30 NOTE — ED Notes (Addendum)
Per EMS: Pt is from [facility]. Facility reports a seizure about 30 seconds long in bed. Facility reports that upon inserting a suppository the seizure began.  Facility reports a decline in mental status as he is normally talkative, however is not communicating verbally.

## 2012-06-30 NOTE — Progress Notes (Signed)
CSW confirmed that patient is a resident at [facility].   Catha Gosselin, LCSWA  [phone]  .06/30/2012 1102pm

## 2012-07-01 ENCOUNTER — Inpatient Hospital Stay (HOSPITAL_COMMUNITY): Payer: Medicare Other

## 2012-07-01 ENCOUNTER — Emergency Department (HOSPITAL_COMMUNITY): Payer: Medicare Other

## 2012-07-01 ENCOUNTER — Encounter (HOSPITAL_COMMUNITY): Payer: Self-pay

## 2012-07-01 DIAGNOSIS — E876 Hypokalemia: Secondary | ICD-10-CM | POA: Diagnosis present

## 2012-07-01 DIAGNOSIS — N390 Urinary tract infection, site not specified: Secondary | ICD-10-CM

## 2012-07-01 DIAGNOSIS — G9340 Encephalopathy, unspecified: Secondary | ICD-10-CM | POA: Diagnosis present

## 2012-07-01 DIAGNOSIS — D689 Coagulation defect, unspecified: Secondary | ICD-10-CM | POA: Diagnosis present

## 2012-07-01 DIAGNOSIS — R6520 Severe sepsis without septic shock: Secondary | ICD-10-CM

## 2012-07-01 DIAGNOSIS — R6521 Severe sepsis with septic shock: Secondary | ICD-10-CM

## 2012-07-01 DIAGNOSIS — R739 Hyperglycemia, unspecified: Secondary | ICD-10-CM | POA: Diagnosis present

## 2012-07-01 DIAGNOSIS — J189 Pneumonia, unspecified organism: Secondary | ICD-10-CM

## 2012-07-01 DIAGNOSIS — A419 Sepsis, unspecified organism: Secondary | ICD-10-CM

## 2012-07-01 HISTORY — DX: Pneumonia, unspecified organism: J18.9

## 2012-07-01 LAB — TROPONIN I
Troponin I: 0.38 ng/mL (ref <0.30)
Troponin I: 0.37 ng/mL (ref <0.30)
Troponin I: <0.3 ng/mL (ref <0.30)
Troponin I: <0.3 ng/mL (ref <0.30)

## 2012-07-01 LAB — URINALYSIS, ROUTINE W REFLEX MICROSCOPIC
Glucose, UA: NEGATIVE mg/dL
Ketones, ur: 15 mg/dL — AB
Nitrite: NEGATIVE
Protein, ur: 30 mg/dL — AB
Specific Gravity, Urine: 1.005 (ref 1.005–1.030)
Urobilinogen, UA: 0.2 mg/dL (ref 0.0–1.0)
pH: 7 (ref 5.0–8.0)

## 2012-07-01 LAB — COMPREHENSIVE METABOLIC PANEL
ALT: 116 U/L — ABNORMAL HIGH (ref 0–53)
ALT: 189 U/L — ABNORMAL HIGH (ref 0–53)
AST: 136 U/L — ABNORMAL HIGH (ref 0–37)
AST: 73 U/L — ABNORMAL HIGH (ref 0–37)
Albumin: 3.9 g/dL (ref 3.5–5.2)
Alkaline Phosphatase: 76 U/L (ref 39–117)
BUN: 33 mg/dL — ABNORMAL HIGH (ref 6–23)
CO2: 19 mEq/L (ref 19–32)
Calcium: 7.1 mg/dL — ABNORMAL LOW (ref 8.4–10.5)
Chloride: 104 mEq/L (ref 96–112)
Potassium: 2.8 mEq/L — ABNORMAL LOW (ref 3.5–5.1)
Potassium: 3.3 mEq/L — ABNORMAL LOW (ref 3.5–5.1)
Sodium: 141 mEq/L (ref 135–145)
Sodium: 142 mEq/L (ref 135–145)
Total Bilirubin: 0.5 mg/dL (ref 0.3–1.2)
Total Protein: 5.6 g/dL — ABNORMAL LOW (ref 6.0–8.3)
Total Protein: 8.3 g/dL (ref 6.0–8.3)

## 2012-07-01 LAB — CBC WITH DIFFERENTIAL/PLATELET
Basophils Absolute: 0 10*3/uL (ref 0.0–0.1)
Basophils Relative: 0 % (ref 0–1)
Eosinophils Absolute: 0 10*3/uL (ref 0.0–0.7)
MCH: 30.2 pg (ref 26.0–34.0)
MCHC: 34.5 g/dL (ref 30.0–36.0)
Monocytes Absolute: 1.5 10*3/uL — ABNORMAL HIGH (ref 0.1–1.0)
Monocytes Relative: 12 % (ref 3–12)
Neutro Abs: 9.9 10*3/uL — ABNORMAL HIGH (ref 1.7–7.7)
Neutrophils Relative %: 78 % — ABNORMAL HIGH (ref 43–77)
RDW: 13.1 % (ref 11.5–15.5)

## 2012-07-01 LAB — LACTIC ACID, PLASMA: Lactic Acid, Venous: 1.4 mmol/L (ref 0.5–2.2)

## 2012-07-01 LAB — PHENYTOIN LEVEL, TOTAL: Phenytoin Lvl: <2.5 ug/mL — ABNORMAL LOW (ref 10.0–20.0)

## 2012-07-01 LAB — GLUCOSE, CAPILLARY
Glucose-Capillary: 152 mg/dL — ABNORMAL HIGH (ref 70–99)
Glucose-Capillary: 141 mg/dL — ABNORMAL HIGH (ref 70–99)
Glucose-Capillary: 126 mg/dL — ABNORMAL HIGH (ref 70–99)

## 2012-07-01 LAB — STREP PNEUMONIAE URINARY ANTIGEN: Strep Pneumo Urinary Antigen: NEGATIVE

## 2012-07-01 LAB — CARBOXYHEMOGLOBIN: Carboxyhemoglobin: 1.2 % (ref 0.5–1.5)

## 2012-07-01 LAB — URINE MICROSCOPIC-ADD ON

## 2012-07-01 LAB — CBC
MCH: 29.7 pg (ref 26.0–34.0)
MCHC: 33.5 g/dL (ref 30.0–36.0)
Platelets: 132 10*3/uL — ABNORMAL LOW (ref 150–400)
RBC: 3.91 MIL/uL — ABNORMAL LOW (ref 4.22–5.81)

## 2012-07-01 LAB — INFLUENZA PANEL BY PCR (TYPE A & B): H1N1 flu by pcr: NOT DETECTED

## 2012-07-01 LAB — APTT: aPTT: 38 seconds — ABNORMAL HIGH (ref 24–37)

## 2012-07-01 LAB — FIBRINOGEN: Fibrinogen: 317 mg/dL (ref 204–475)

## 2012-07-01 LAB — PROTIME-INR: Prothrombin Time: 19.2 seconds — ABNORMAL HIGH (ref 11.6–15.2)

## 2012-07-01 MED ORDER — SODIUM CHLORIDE 0.9 % IV SOLN: 250.0000 mL | INTRAVENOUS | Status: DC | PRN

## 2012-07-01 MED ORDER — SODIUM CHLORIDE 0.9 % IV BOLUS (SEPSIS): 500.0000 mL | INTRAVENOUS | Status: DC | PRN

## 2012-07-01 MED ORDER — DEXTROSE 5 % IV SOLN
1.0000 g | Freq: Three times a day (TID) | INTRAVENOUS | Status: DC
  Administered 2012-07-01: 1 g via INTRAVENOUS
  Filled 2012-07-01 (×2): qty 1

## 2012-07-01 MED ORDER — VANCOMYCIN HCL 1000 MG IV SOLR
750.0000 mg | Freq: Two times a day (BID) | INTRAVENOUS | Status: DC
  Administered 2012-07-01 – 2012-07-02 (×2): 750 mg via INTRAVENOUS
  Filled 2012-07-01 (×3): qty 750

## 2012-07-01 MED ORDER — NOREPINEPHRINE BITARTRATE 1 MG/ML IJ SOLN
2.0000 ug/min | INTRAMUSCULAR | Status: DC | PRN
  Filled 2012-07-01 (×2): qty 4

## 2012-07-01 MED ORDER — DOBUTAMINE IN D5W 4-5 MG/ML-% IV SOLN
2.5000 ug/kg/min | INTRAVENOUS | Status: DC | PRN
  Filled 2012-07-01: qty 250

## 2012-07-01 MED ORDER — VANCOMYCIN HCL IN DEXTROSE 1-5 GM/200ML-% IV SOLN
1000.0000 mg | Freq: Once | INTRAVENOUS | Status: AC
  Administered 2012-07-01: 1000 mg via INTRAVENOUS
  Filled 2012-07-01: qty 200

## 2012-07-01 MED ORDER — SODIUM CHLORIDE 0.9 % IV SOLN
1000.0000 mL | Freq: Once | INTRAVENOUS | Status: DC
  Administered 2012-07-01: 1000 mL via INTRAVENOUS

## 2012-07-01 MED ORDER — PIPERACILLIN-TAZOBACTAM 3.375 G IVPB 30 MIN
3.3750 g | Freq: Once | INTRAVENOUS | Status: AC
  Administered 2012-07-01: 3.375 g via INTRAVENOUS
  Filled 2012-07-01: qty 50

## 2012-07-01 MED ORDER — SODIUM CHLORIDE 0.9 % IV SOLN
1000.0000 mg | Freq: Once | INTRAVENOUS | Status: AC
  Administered 2012-07-01: 1000 mg via INTRAVENOUS
  Filled 2012-07-01: qty 20

## 2012-07-01 MED ORDER — SODIUM CHLORIDE 0.9 % IV BOLUS (SEPSIS)
25.0000 mL/kg | Freq: Once | INTRAVENOUS | Status: AC
  Administered 2012-07-01: 1588 mL via INTRAVENOUS

## 2012-07-01 MED ORDER — VANCOMYCIN HCL IN DEXTROSE 1-5 GM/200ML-% IV SOLN: 1000.0000 mg | Freq: Once | INTRAVENOUS | Status: DC

## 2012-07-01 MED ORDER — NOREPINEPHRINE BITARTRATE 1 MG/ML IJ SOLN
2.0000 ug/min | Freq: Once | INTRAVENOUS | Status: AC
  Administered 2012-07-01: 3 ug/min via INTRAVENOUS
  Filled 2012-07-01: qty 4

## 2012-07-01 MED ORDER — PIPERACILLIN-TAZOBACTAM 3.375 G IVPB
3.3750 g | Freq: Three times a day (TID) | INTRAVENOUS | Status: DC
  Administered 2012-07-01 – 2012-07-06 (×15): 3.375 g via INTRAVENOUS
  Filled 2012-07-01 (×17): qty 50

## 2012-07-01 MED ORDER — SODIUM CHLORIDE 0.9 % IV SOLN
300.0000 mg | Freq: Two times a day (BID) | INTRAVENOUS | Status: DC
  Administered 2012-07-01 – 2012-07-04 (×6): 300 mg via INTRAVENOUS
  Filled 2012-07-01 (×9): qty 3

## 2012-07-01 MED ORDER — INSULIN ASPART 100 UNIT/ML ~~LOC~~ SOLN
2.0000 [IU] | SUBCUTANEOUS | Status: DC
  Administered 2012-07-01: 4 [IU] via SUBCUTANEOUS
  Administered 2012-07-01 – 2012-07-04 (×7): 2 [IU] via SUBCUTANEOUS
  Filled 2012-07-01: qty 1
  Filled 2012-07-01: qty 2

## 2012-07-01 MED ORDER — PHENYTOIN SODIUM 50 MG/ML IJ SOLN
100.0000 mg | Freq: Three times a day (TID) | INTRAMUSCULAR | Status: DC
  Administered 2012-07-01 – 2012-07-04 (×7): 100 mg via INTRAVENOUS
  Filled 2012-07-01 (×11): qty 2

## 2012-07-01 MED ORDER — MUPIROCIN 2 % EX OINT
1.0000 "application " | TOPICAL_OINTMENT | Freq: Two times a day (BID) | CUTANEOUS | Status: AC
  Administered 2012-07-01 – 2012-07-06 (×10): 1 via NASAL
  Filled 2012-07-01: qty 22

## 2012-07-01 MED ORDER — CHLORHEXIDINE GLUCONATE CLOTH 2 % EX PADS
6.0000 | MEDICATED_PAD | Freq: Every day | CUTANEOUS | Status: DC
  Administered 2012-07-02: 6 via TOPICAL

## 2012-07-01 MED ORDER — SODIUM CHLORIDE 0.9 % IV BOLUS (SEPSIS)
1000.0000 mL | Freq: Once | INTRAVENOUS | Status: AC
  Administered 2012-07-01: 1000 mL via INTRAVENOUS

## 2012-07-01 MED ORDER — SODIUM CHLORIDE 0.9 % IV SOLN
INTRAVENOUS | Status: DC
  Administered 2012-07-01 – 2012-07-07 (×9): via INTRAVENOUS

## 2012-07-01 MED ORDER — FAMOTIDINE 20 MG PO TABS
20.0000 mg | ORAL_TABLET | Freq: Two times a day (BID) | ORAL | Status: DC
  Filled 2012-07-01 (×2): qty 1

## 2012-07-01 MED ORDER — POTASSIUM CHLORIDE 10 MEQ/100ML IV SOLN
10.0000 meq | INTRAVENOUS | Status: AC
  Administered 2012-07-01 (×3): 10 meq via INTRAVENOUS
  Filled 2012-07-01 (×3): qty 100

## 2012-07-01 MED ORDER — LEVOFLOXACIN IN D5W 750 MG/150ML IV SOLN
750.0000 mg | INTRAVENOUS | Status: DC
  Administered 2012-07-01 (×2): 750 mg via INTRAVENOUS
  Filled 2012-07-01 (×2): qty 150

## 2012-07-01 MED ORDER — SODIUM CHLORIDE 0.9 % IV BOLUS (SEPSIS)
500.0000 mL | Freq: Once | INTRAVENOUS | Status: DC
  Administered 2012-07-01: 500 mL via INTRAVENOUS

## 2012-07-01 MED ORDER — OXYBUTYNIN CHLORIDE 5 MG PO TABS
5.0000 mg | ORAL_TABLET | Freq: Two times a day (BID) | ORAL | Status: DC
  Filled 2012-07-01 (×2): qty 1

## 2012-07-01 MED ORDER — LEVETIRACETAM 100 MG/ML PO SOLN
300.0000 mg | Freq: Two times a day (BID) | ORAL | Status: DC
  Filled 2012-07-01 (×3): qty 5

## 2012-07-01 MED ORDER — SODIUM CHLORIDE 0.9 % IV SOLN
1000.0000 mL | INTRAVENOUS | Status: DC
  Administered 2012-07-01 (×2): 1000 mL via INTRAVENOUS

## 2012-07-01 MED ORDER — FAMOTIDINE IN NACL 20-0.9 MG/50ML-% IV SOLN
20.0000 mg | Freq: Two times a day (BID) | INTRAVENOUS | Status: DC
  Administered 2012-07-01 – 2012-07-04 (×7): 20 mg via INTRAVENOUS
  Filled 2012-07-01 (×9): qty 50

## 2012-07-01 NOTE — ED Notes (Signed)
Pt with b/p of systolic 75 md aware. Levophed ordered awaiting arrival from pharmacy.

## 2012-07-01 NOTE — ED Notes (Signed)
Pt transferred to ICU accompanied by this nurse and NT on cardiac monitor with chart, meds, blood labels and personal belongings. Condition stable at time of transfer.

## 2012-07-01 NOTE — ED Notes (Signed)
Attempted to call report to Shawn, RN in ICU, unable to take report, this nurse was asked to call back in about 10 minutes, will monitor.

## 2012-07-01 NOTE — ED Notes (Signed)
PCCM PA at bedside, aware of abnormal labs.

## 2012-07-01 NOTE — ED Notes (Signed)
Pt's family called spoke with pt's son and informed pt in hospitla.

## 2012-07-01 NOTE — H&P (Signed)
PULMONARY  / CRITICAL CARE MEDICINE  Name: [name] MRN: [id number] DOB: 1946/02/08    LOS: 1  REFERRING MD :  Norlene Campbell, EDP  CHIEF COMPLAINT:  Seizure, hypotension  BRIEF PATIENT DESCRIPTION: 66 y/o male with known seizure disorder presented to the [facility] ED on 12/31 with seizures and septic shock presumed from a UTI.  He was found to be hypotensive despite 5L NS so PCCM consulted for admission.    LINES / TUBES: 12/31 R IJ CVL>> Suprapubic cath>>  CULTURES: 12/31 blood x2 >> 12/31 urine >> 12/31 procalcitonin>> 12/31 flu >> negative  ANTIBIOTICS: 12/31 vanc >> 12/31 zosyn >> 12/31 levaquin >> 12/31 cefepime x1 12/31 levaquin x1  SIGNIFICANT EVENTS:  12/31  Admitted with septic shock in setting of UTI and encephalopathy / seizures  LEVEL OF CARE:  ICU  PRIMARY SERVICE:  PCCM  CONSULTANTS:    CODE STATUS: Full  DIET:  NPO  DVT Px:  SCDs  GI Px:  Pepcid  HISTORY OF PRESENT ILLNESS:  66 y/o male with known seizure disorder presented to the [facility] ED on 12/31 with seizures and septic shock presumed from a UTI.  He was found to be hypotensive despite 5L NS so PCCM consulted for admission.  PAST MEDICAL HISTORY :  Past Medical History  Diagnosis Date  . Diabetes mellitus   . Orchitis and epididymitis in disease classified elsewhere   . Unspecified intestinal obstruction   . Unspecified osteomyelitis, site unspecified   . Encounter for long-term (current) use of other medications   . Hypertrophy of prostate without urinary obstruction and other lower urinary tract symptoms (LUTS)   . Depressive disorder, not elsewhere classified   . Edema of penis   . Pulmonary congestion and hypostasis   . Other ascites   . Thrombocytopenia   . Urinary tract infection, site not specified   . Chronic ulcer of other specified site   . Acute, but ill-defined, cerebrovascular disease   . Intestinal infection due to E. coli, unspecified   . Other convulsions   . Unspecified  essential hypertension   . Urinary tract infection, site not specified   . Acute upper respiratory infections of unspecified site   . Other chronic pain   . Unspecified psychosis   . Unspecified constipation   . Acute kidney failure, unspecified    Past Surgical History  Procedure Date  . 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 Vp shunt placement 1990  . Circumcision 2007  . Suprapubic catheter placement 2007   Prior to Admission medications   Medication Sig Start Date End Date Taking? Authorizing Provider  bisacodyl (DULCOLAX) 10 MG suppository Place 10 mg rectally daily as needed. For constipation.   Yes Historical Provider, MD  cholecalciferol (VITAMIN D) 1000 UNITS tablet Take 1,000 Units by mouth daily.   Yes Historical Provider, MD  feeding supplement (PRO-STAT SUGAR FREE 64) LIQD Take 30 mLs by mouth 3 (three) times daily with meals.   Yes Historical Provider, MD  levETIRAcetam (KEPPRA) 100 MG/ML solution Take 300 mg by mouth 2 (two) times daily.    Yes Historical Provider, MD  LORazepam (ATIVAN) 2 MG/ML injection Inject 1 mg into the muscle daily as needed. For seizure activity; may repeat once 30 minutes after.   Yes Historical Provider, MD  magnesium oxide (MAG-OX) 400 MG tablet Take 400 mg by mouth daily.   Yes Historical Provider, MD  Multiple Vitamin (MULITIVITAMIN WITH MINERALS) TABS  Take 2 tablets by mouth daily.    Yes Historical Provider, MD  oxybutynin (DITROPAN) 5 MG tablet Take 5 mg by mouth 2 (two) times daily.   Yes Historical Provider, MD  phenytoin (DILANTIN) 100 MG ER capsule Take 100 mg by mouth 2 (two) times daily.    Yes Historical Provider, MD  polyethylene glycol powder (GLYCOLAX/MIRALAX) powder Take 17 g by mouth daily as needed. constipation   Yes Historical Provider, MD  ranitidine (ZANTAC) 150 MG tablet Take 150 mg by mouth at bedtime.   Yes Historical Provider, MD  simvastatin (ZOCOR) 40 MG tablet Take 40 mg by mouth every evening.   Yes  Historical Provider, MD   Allergies  Allergen Reactions  . Heparin Other (See Comments)    Hx of HIT in 2007   FAMILY HISTORY:  History reviewed. No pertinent family history.  SOCIAL HISTORY:  reports that he has been smoking Cigarettes.  He has never used smokeless tobacco. He reports that he does not drink alcohol or use illicit drugs.  REVIEW OF SYSTEMS:  Unable to provide  INTERVAL HISTORY:   VITAL SIGNS: Temp:  [99.4 F (37.4 C)-102.1 F (38.9 C)] 99.4 F (37.4 C) (12/31 0711) Pulse Rate:  [81-141] 88  (12/31 1300) Resp:  [17-30] 20  (12/31 1300) BP: (72-132)/(48-104) 120/67 mmHg (12/31 1300) SpO2:  [92 %-100 %] 100 % (12/31 1300) Weight:  [63.504 kg (140 lb)-66.4 kg (146 lb 6.2 oz)] 66.4 kg (146 lb 6.2 oz) (12/31 1030)  HEMODYNAMICS: CVP:  [0 mmHg-6 mmHg] 4 mmHg  VENTILATOR SETTINGS:    INTAKE / OUTPUT: Intake/Output      12/30 0701 - 12/31 0700 12/31 0701 - 01/01 0700   I.V. (mL/kg)  1048.9 (15.8)   Total Intake(mL/kg)  1048.9 (15.8)   Urine (mL/kg/hr)  1150 (2.7)   Total Output  1150   Net  -101.1        Stool Occurrence  1 x     PHYSICAL EXAMINATION: General:  No acute distress Neuro:  Encephalopathic, nonfocal. Left vp  Shunt noted HEENT:  PERRL Cardiovascular:  RRR, no murmurs Lungs:  Scattered rhonchi Abdomen:  Soft, nontender, chronic suprapubic catheter Musculoskeletal:  Bilateral AKA Skin:  Intact   LABS:  Lab 07/01/12 1040 07/01/12 0728 07/01/12 0555 07/01/12 0535 06/30/12 2335  HGB -- -- -- 11.6* 15.4  WBC -- -- -- 10.9* 12.7*  PLT -- -- -- 132* 185  NA -- -- -- 141 142  K -- -- -- 3.3* 2.8*  CL -- -- -- 115* 104  CO2 -- -- -- 19 21  GLUCOSE -- -- -- 142* 158*  BUN -- -- -- 21 33*  CREATININE -- -- -- 0.84 1.08  CALCIUM -- -- -- 7.1* 9.1  MG -- -- -- -- --  PHOS -- -- -- -- --  AST -- -- -- 73* 136*  ALT -- -- -- 116* 189*  ALKPHOS -- -- -- 50 76  BILITOT -- -- -- 0.5 0.5  PROT -- -- -- 5.6* 8.3  ALBUMIN -- -- -- 2.6* 3.9    INR -- -- -- 1.68* --  APTT -- -- -- 38* --  INR -- -- -- 1.68* --  LATICACIDVEN -- 1.4 1.3 -- 1.4  TROPONINI -- 0.37* -- 0.38* --  PHART -- -- -- -- --  PCO2ART -- -- -- -- --  PO2ART -- -- -- -- --  HCO3 -- -- -- -- --  O2SAT 83.2 -- -- -- --  IMAGING: 12/31  PCXR >> Hardware in good position, bilateral airspace disease 12/31  Abdomen US >> Suspected hepatitis, cholelithiasis, suspected medical renal disease  ECG: 12/31  12 lead ECG >>> Sinus tachycardia, 1st degree AV block, diffuse ST-T changes  DIAGNOSES: Active Problems:  CONVULSIONS, SEIZURES, NOS  UTI (lower urinary tract infection)  HCAP (healthcare-associated pneumonia)  Septic shock  Acute encephalopathy  Coagulopathy  Hypokalemia  Hyperglycemia  ASSESSMENT / PLAN:  PULMONARY  A:  Possible HCAP. P:   Goal SpO2 > 92 Supplemental oxygen PRN Trend CXR Abx as per ID section  CARDIOVASCULAR  A: Septic shock in setting of UTI / chronic suprapubic catheter, possible HCAP and severe dehydration. P:  Goal MAP 65 EGDT protocol  RENAL  A:  Severe dehydration. P:   Goal CVP> 10 NS boluses per EGDT protocol  GASTROINTESTINAL  A:  Sonographic evidence of hepatitis.  Elevated transaminases.  Cholelithiasis without evidence of cholecystitis. P:   NPO Acute hepatitis serology sent Trend LFTs  HEMATOLOGIC  A:  Anemia.  Thrombocytopenia.  Coagulopathy. P:  Trend CBC Trend APTT / INR  INFECTIOUS  A:  UTI.  Possible HCAP. Pct equivical (2.05) P:   levaquin + vanco + zosyn, can likely d/c the zosyn when cx data available  ENDOCRINE  A:  DM.  Hyperglycemia. P:   Glycemic control protocol, Phase 1  NEUROLOGIC  A:  Acute encephalopathy.  Seizures. Hx of Left VP shunt. P:   -keppra -CT head due to AMS and VP shunt -do not believe he has the MS to safely take PO >> place NGT for meds   [facility] ACNP Adolph Pollack PCCM Pager [phone] till 3 pm If no answer page [phone] 07/01/2012, 1:28  PM   I have personally obtained a history, examined the patient, evaluated laboratory and imaging results, formulated the assessment and plan and placed orders. CRITICAL CARE: The patient is critically ill with multiple organ systems failure and requires high complexity decision making for assessment and support, frequent evaluation and titration of therapies, application of advanced monitoring technologies and extensive interpretation of multiple databases. Critical Care Time devoted to patient care services described in this note is 60 minutes.    Levy Pupa, MD, PhD 07/01/2012, 1:28 PM [facility] [phone] or if no answer [phone]

## 2012-07-01 NOTE — ED Provider Notes (Signed)
History     CSN: [id number]  Arrival date & time 06/30/12  2222   First MD Initiated Contact with Patient 06/30/12 2306      Chief Complaint  Patient presents with  . Seizures    (Consider location/radiation/quality/duration/timing/severity/associated sxs/prior treatment) HPI 66 year old male presents to emergency department via EMS from nursing facility with reported seizure like activity, and altered mental status. Patient noted to have temperature of 102.1. Patient has history of seizure disorder. It is reported that when they were attempting to place a Tylenol suppository he had seizure-like activity. Patient is reported to normally be verbal, but does not answer questions at this time. No other history is available.  Past Medical History  Diagnosis Date  . Diabetes mellitus   . Orchitis and epididymitis in disease classified elsewhere   . Unspecified intestinal obstruction   . Unspecified osteomyelitis, site unspecified   . Encounter for long-term (current) use of other medications   . Hypertrophy of prostate without urinary obstruction and other lower urinary tract symptoms (LUTS)   . Depressive disorder, not elsewhere classified   . Edema of penis   . Pulmonary congestion and hypostasis   . Other ascites   . Thrombocytopenia   . Urinary tract infection, site not specified   . Chronic ulcer of other specified site   . Acute, but ill-defined, cerebrovascular disease   . Intestinal infection due to E. coli, unspecified   . Other convulsions   . Unspecified essential hypertension   . Urinary tract infection, site not specified   . Acute upper respiratory infections of unspecified site   . Other chronic pain   . Unspecified psychosis   . Unspecified constipation   . Acute kidney failure, unspecified     Past Surgical History  Procedure Date  . 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  History  Substance Use Topics  .  Smoking status: Not on file  . Smokeless tobacco: Not on file  . Alcohol Use: No      Review of Systems  Unable to perform ROS: Mental status change    Allergies  Heparin  Home Medications   Current Outpatient Rx  Name  Route  Sig  Dispense  Refill  . BISACODYL 10 MG RE SUPP   Rectal   Place 10 mg rectally daily as needed. For constipation.         Marland Kitchen VITAMIN D 1000 UNITS PO TABS   Oral   Take 1,000 Units by mouth daily.         Marland Kitchen PRO-STAT 64 PO LIQD   Oral   Take 30 mLs by mouth 3 (three) times daily with meals.         Marland Kitchen LEVETIRACETAM 100 MG/ML PO SOLN   Oral   Take 300 mg by mouth 2 (two) times daily.          Marland Kitchen LORAZEPAM 2 MG/ML IJ SOLN   Intramuscular   Inject 1 mg into the muscle daily as needed. For seizure activity; may repeat once 30 minutes after.         Marland Kitchen MAGNESIUM OXIDE 400 MG PO TABS   Oral   Take 400 mg by mouth daily.         . ADULT MULTIVITAMIN W/MINERALS CH   Oral   Take 2 tablets by mouth daily.          . OXYBUTYNIN CHLORIDE  5 MG PO TABS   Oral   Take 5 mg by mouth 2 (two) times daily.         Marland Kitchen PHENYTOIN SODIUM EXTENDED 100 MG PO CAPS   Oral   Take 100 mg by mouth 2 (two) times daily.          Marland Kitchen POLYETHYLENE GLYCOL 3350 PO POWD   Oral   Take 17 g by mouth daily as needed. constipation         . RANITIDINE HCL 150 MG PO TABS   Oral   Take 150 mg by mouth at bedtime.         Marland Kitchen SIMVASTATIN 40 MG PO TABS   Oral   Take 40 mg by mouth every evening.           BP 111/77  Pulse 141  Temp 102.1 F (38.9 C) (Oral)  SpO2 92%  Physical Exam  Nursing note and vitals reviewed. Constitutional: He appears distressed.       Patient has acute on chronically ill-appearing gentleman. He has bilateral AKA's. He has a suprapubic catheter. Patient opens eyes to voice, otherwise does not follow commands or verbalize  HENT:       Very dry mucous membranes  Eyes: Pupils are equal, round, and reactive to light.        Conjunctiva erythematous and injected  Neck: Normal range of motion. Neck supple. No JVD present. No tracheal deviation present. No thyromegaly present.  Cardiovascular: Normal heart sounds and intact distal pulses.  Exam reveals no gallop and no friction rub.   No murmur heard.      Tachycardia noted  Pulmonary/Chest: No stridor. He has no wheezes. He has no rales. He exhibits no tenderness.       Coarse rhonchi in bilateral lungs  Abdominal: Soft. Bowel sounds are normal. He exhibits no distension and no mass. There is no tenderness. There is no rebound and no guarding.  Musculoskeletal: He exhibits no edema and no tenderness.  Lymphadenopathy:    He has no cervical adenopathy.  Neurological: He is alert.  Skin: He is not diaphoretic.       Skin is hot and dry to touch    ED Course  Procedures (including critical care time)  CRITICAL CARE Performed by: Olivia Mackie   Total critical care time: 60 min  Critical care time was exclusive of separately billable procedures and treating other patients.  Critical care was necessary to treat or prevent imminent or life-threatening deterioration.  Critical care was time spent personally by me on the following activities: development of treatment plan with patient and/or surrogate as well as nursing, discussions with consultants, evaluation of patient's response to treatment, examination of patient, obtaining history from patient or surrogate, ordering and performing treatments and interventions, ordering and review of laboratory studies, ordering and review of radiographic studies, pulse oximetry and re-evaluation of patient's condition.  CENTRAL LINE Performed by: Olivia Mackie Consent: The procedure was performed in an emergent situation. Required items: required blood products, implants, devices, and special equipment available Patient identity confirmed: arm band and provided demographic data Time out: Immediately prior to procedure a  "time out" was called to verify the correct patient, procedure, equipment, support staff and site/side marked as required. Indications: vascular access Anesthesia: local infiltration Local anesthetic: lidocaine 1% with epinephrine Anesthetic total: 3 ml Patient sedated: no Preparation: skin prepped with 2% chlorhexidine Skin prep agent dried: skin prep agent completely dried prior to procedure Sterile barriers:  all five maximum sterile barriers used - cap, mask, sterile gown, sterile gloves, and large sterile sheet Hand hygiene: hand hygiene performed prior to central venous catheter insertion  Location details: right IJ  Catheter type: triple lumen Catheter size: 8 Fr Pre-procedure: landmarks identified Ultrasound guidance: yes Successful placement: yes Post-procedure: line sutured and dressing applied Assessment: blood return through all parts, free fluid flow, placement verified by x-ray and no pneumothorax on x-ray Patient tolerance: Patient tolerated the procedure well with no immediate complications.   Labs Reviewed  CBC WITH DIFFERENTIAL - Abnormal; Notable for the following:    WBC 12.7 (*)     Neutrophils Relative 78 (*)     Neutro Abs 9.9 (*)     Lymphocytes Relative 10 (*)     Monocytes Absolute 1.5 (*)     All other components within normal limits  URINALYSIS, ROUTINE W REFLEX MICROSCOPIC - Abnormal; Notable for the following:    APPearance CLOUDY (*)     Hgb urine dipstick SMALL (*)     Bilirubin Urine SMALL (*)     Ketones, ur 15 (*)     Protein, ur 30 (*)     Leukocytes, UA LARGE (*)     All other components within normal limits  COMPREHENSIVE METABOLIC PANEL - Abnormal; Notable for the following:    Potassium 2.8 (*)     Glucose, Bld 158 (*)     BUN 33 (*)     AST 136 (*)     ALT 189 (*)     GFR calc non Af Amer 71 (*)     GFR calc Af Amer 82 (*)     All other components within normal limits  URINE MICROSCOPIC-ADD ON - Abnormal; Notable for the  following:    Bacteria, UA MANY (*)     Casts HYALINE CASTS (*)     All other components within normal limits  CBC - Abnormal; Notable for the following:    WBC 10.9 (*)     RBC 3.91 (*)     Hemoglobin 11.6 (*)     HCT 34.6 (*)     Platelets 132 (*)     All other components within normal limits  COMPREHENSIVE METABOLIC PANEL - Abnormal; Notable for the following:    Potassium 3.3 (*)     Chloride 115 (*)     Glucose, Bld 142 (*)     Calcium 7.1 (*)     Total Protein 5.6 (*)     Albumin 2.6 (*)     AST 73 (*)     ALT 116 (*)     All other components within normal limits  URINALYSIS, ROUTINE W REFLEX MICROSCOPIC - Abnormal; Notable for the following:    APPearance CLOUDY (*)     Hgb urine dipstick SMALL (*)     Leukocytes, UA LARGE (*)     All other components within normal limits  TROPONIN I - Abnormal; Notable for the following:    Troponin I 0.38 (*)     All other components within normal limits  PROTIME-INR - Abnormal; Notable for the following:    Prothrombin Time 19.2 (*)     INR 1.68 (*)     All other components within normal limits  APTT - Abnormal; Notable for the following:    aPTT 38 (*)     All other components within normal limits  URINE MICROSCOPIC-ADD ON - Abnormal; Notable for the following:    Bacteria, UA  MANY (*)     All other components within normal limits  GLUCOSE, CAPILLARY - Abnormal; Notable for the following:    Glucose-Capillary 152 (*)     All other components within normal limits  LACTIC ACID, PLASMA  STREP PNEUMONIAE URINARY ANTIGEN  LACTIC ACID, PLASMA  PROCALCITONIN  FIBRINOGEN  TYPE AND SCREEN  CULTURE, BLOOD (ROUTINE X 2)  CULTURE, BLOOD (ROUTINE X 2)  URINE CULTURE  CULTURE, EXPECTORATED SPUTUM-ASSESSMENT  GRAM STAIN  INFLUENZA PANEL BY PCR  CULTURE, BLOOD (ROUTINE X 2)  CULTURE, BLOOD (ROUTINE X 2)  URINE CULTURE  CORTISOL  TROPONIN I  TROPONIN I  TROPONIN I  LACTIC ACID, PLASMA  LACTIC ACID, PLASMA  LACTIC ACID, PLASMA    PHENYTOIN LEVEL, FREE  HEPATITIS PANEL, ACUTE  PHENYTOIN LEVEL, TOTAL   US Abdomen Complete  07/01/2012  *RADIOLOGY REPORT*  Clinical Data:  Elevated LFTs; fever.  ABDOMINAL ULTRASOUND COMPLETE  Comparison:  CT of the abdomen and pelvis performed 04/04/2012  Findings:  Gallbladder:  Several prominent stones are noted within the gallbladder, measuring up to 0.9 cm in size.  No gallbladder wall thickening or pericholecystic fluid is seen.  No ultrasonographic Murphy's sign is elicited.  Common Bile Duct:  0.6 cm in diameter; within normal limits in caliber.  Liver:  A "starry sky" appearance is suggested within the liver, with prominence of the portal triads; no focal lesions identified. Limited Doppler evaluation demonstrates normal blood flow within the liver.  IVC:  Unremarkable in appearance.  Pancreas:  Although the pancreas is difficult to visualize in its entirety due to overlying bowel gas, no focal pancreatic abnormality is identified.  Spleen:  8.2 cm in length; within normal limits in size and echotexture.  Right kidney:  10.4 cm in length; normal in size, configuration and parenchymal echogenicity.  Mild renal cortical thinning is noted. No evidence of mass or hydronephrosis.  Left kidney:  11.2 cm in length; normal in size, configuration and parenchymal echogenicity.  No evidence of mass or hydronephrosis.  Abdominal Aorta: Not visualized due to overlying bowel gas.  IMPRESSION:  1.  The appearance of the liver suggests mild hepatitis. 2.  Cholelithiasis; gallbladder otherwise unremarkable in appearance. 3.  Mild right renal cortical thinning noted; this may reflect chronic renal disease.   Original Report Authenticated By: Tonia Ghent, M.D.    Dg Chest Portable 1 View  07/01/2012  *RADIOLOGY REPORT*  Clinical Data: Central line placement.  Seizures.  PORTABLE CHEST - 1 VIEW  Comparison: Chest radiograph performed 06/30/2012  Findings: A right IJ line is noted ending about the proximal to  mid SVC.  The patient's ventriculoperitoneal shunt is grossly unchanged in appearance.  The lungs are relatively well expanded.  Mild bibasilar airspace opacities, and mild left midlung opacity, may reflect atelectasis or pneumonia.  No definite pleural effusion or pneumothorax is seen.  The cardiomediastinal silhouette is borderline normal in size; calcification is noted in the aortic arch.  No acute osseous abnormalities are identified.  IMPRESSION:  1.  Right IJ line noted ending about the proximal to mid SVC. 2.  Mild bibasilar airspace opacities, and mild left midlung opacity, may reflect atelectasis or pneumonia.   Original Report Authenticated By: Tonia Ghent, M.D.    Dg Chest Port 1 View  06/30/2012  *RADIOLOGY REPORT*  Clinical Data: 66 year old male with fever, altered mental status, possible sepsis.  PORTABLE CHEST - 1 VIEW  Comparison: 04/25/2012 and earlier.  Findings: Portable supine AP view 2325 hours.  Stable  lung volumes. Left neck chest and abdomen and shunt catheter re-identified. Cardiac size and mediastinal contours are within normal limits. Visualized tracheal air column is within normal limits.  No pulmonary edema, definite effusion or pneumothorax.  Streaky left lung base opacity.  IMPRESSION: Streaky left lung base opacity.  Cannot exclude developing left lung base pneumonia in this setting.  Otherwise stable chest.   Original Report Authenticated By: Erskine Speed, M.D.     Date: 07/01/2012  Rate: 101  Rhythm: sinus tachycardia  QRS Axis: normal  Intervals: PR prolonged  ST/T Wave abnormalities: nonspecific T wave changes  Conduction Disutrbances:first-degree A-V block   Narrative Interpretation:   Old EKG Reviewed: unchanged     1. Sepsis   2. UTI (lower urinary tract infection)   3. CONVULSIONS, SEIZURES, NOS       MDM  66 year old male coming from nursing facility with fever. Sources could include pneumonia urinary tract infection, bacteremia. Concern for  sepsis given his tachycardia and low blood pressures. Full labs have been ordered. Will start on empiric antibiotics and give fluid bolus.  Patient with persistent hypotension despite IV fluid boluses. Code sepsis called. Discussed case with the hospitalist, but given his decompensation, a central line was placed, and he will be moved to the ICU under the critical care team. Patient clinically improving after fluids with improvement of status, and improvement of tachycardia        Olivia Mackie, MD 07/01/12 [phone]

## 2012-07-01 NOTE — ED Notes (Signed)
Patient transported to CT 

## 2012-07-01 NOTE — ED Notes (Signed)
Dark green tube sent to lab 

## 2012-07-01 NOTE — Progress Notes (Signed)
ANTIBIOTIC CONSULT NOTE - INITIAL  Pharmacy Consult for vancomycin/zosyn/levofloxacin/CCM/sepsis Indication: rule out sepsis  Allergies  Allergen Reactions  . Heparin Other (See Comments)    Unknown reaction Not listed on MAR    Patient Measurements: Height: 5\' 8"  (172.7 cm) (est. before bilat AKA) Weight: 140 lb (63.504 kg) (Est. by ED RN--please update) IBW/kg (Calculated) : 68.4  Adjusted Body Weight:   Vital Signs: Temp: 100.3 F (37.9 C) (12/31 0318) Temp src: Rectal (12/31 0318) BP: 123/67 mmHg (12/31 0640) Pulse Rate: 101  (12/31 0640) Intake/Output from previous day:   Intake/Output from this shift:    Labs:  [facility] 07/01/12 0535 06/30/12 2335  WBC 10.9* 12.7*  HGB 11.6* 15.4  PLT 132* 185  LABCREA -- --  CREATININE -- 1.08   Estimated Creatinine Clearance: 62.1 ml/min (by C-G formula based on Cr of 1.08). No results found for this basename: VANCOTROUGH:2,VANCOPEAK:2,VANCORANDOM:2,GENTTROUGH:2,GENTPEAK:2,GENTRANDOM:2,TOBRATROUGH:2,TOBRAPEAK:2,TOBRARND:2,AMIKACINPEAK:2,AMIKACINTROU:2,AMIKACIN:2, in the last 72 hours   Microbiology: No results found for this or any previous visit (from the past 720 hour(s)).  Medical History: Past Medical History  Diagnosis Date  . Diabetes mellitus   . Orchitis and epididymitis in disease classified elsewhere   . Unspecified intestinal obstruction   . Unspecified osteomyelitis, site unspecified   . Encounter for long-term (current) use of other medications   . Hypertrophy of prostate without urinary obstruction and other lower urinary tract symptoms (LUTS)   . Depressive disorder, not elsewhere classified   . Edema of penis   . Pulmonary congestion and hypostasis   . Other ascites   . Thrombocytopenia   . Urinary tract infection, site not specified   . Chronic ulcer of other specified site   . Acute, but ill-defined, cerebrovascular disease   . Intestinal infection due to E. coli, unspecified   . Other convulsions    . Unspecified essential hypertension   . Urinary tract infection, site not specified   . Acute upper respiratory infections of unspecified site   . Other chronic pain   . Unspecified psychosis   . Unspecified constipation   . Acute kidney failure, unspecified     Medications:  Anti-infectives     Start     Dose/Rate Route Frequency Ordered Stop   07/01/12 0530  piperacillin-tazobactam (ZOSYN) IVPB 3.375 g       3.375 g 100 mL/hr over 30 Minutes Intravenous  Once 07/01/12 0524 07/01/12 0647   07/01/12 0530   vancomycin (VANCOCIN) IVPB 1000 mg/200 mL premix  Status:  Discontinued        1,000 mg 200 mL/hr over 60 Minutes Intravenous  Once 07/01/12 0524 07/01/12 0530   07/01/12 0015   ceFEPIme (MAXIPIME) 1 g in dextrose 5 % 50 mL IVPB  Status:  Discontinued        1 g 100 mL/hr over 30 Minutes Intravenous 3 times per day 07/01/12 0006 07/01/12 0549   07/01/12 0015   levofloxacin (LEVAQUIN) IVPB 750 mg        750 mg 100 mL/hr over 90 Minutes Intravenous Every 24 hours 07/01/12 0006 07/03/12 2159   07/01/12 0015   vancomycin (VANCOCIN) IVPB 1000 mg/200 mL premix        1,000 mg 200 mL/hr over 60 Minutes Intravenous  Once 07/01/12 0009 07/01/12 0317         Assessment: Patient with sepsis.  First dose of antibiotics already given in ED.    Goal of Therapy:  Vancomycin trough level 15-20 mcg/ml Zosyn based on renal function  Levofloxacin  dosed based on patient weight and renal function   Plan:  Measure antibiotic drug levels at steady state Follow up culture results Vancomycin 750mg  iv q12hr, levofloxacin 750mg  iv q24hr (x3 doses as previously ordered) Zosyn 3.375g IV Q8H infused over 4hrs.  Darlina Guys, Jacquenette Shone Crowford 07/01/2012,6:57 AM

## 2012-07-01 NOTE — Progress Notes (Signed)
[id number]/Rhonda Davis, RN, BSN, CCM: CHART REVIEWED AND UPDATED.  Next chart review due on [id number]. NO DISCHARGE NEEDS PRESENT AT THIS TIME. CASE MANAGEMENT [phone] 

## 2012-07-01 NOTE — Progress Notes (Signed)
MEDICATION RELATED CONSULT NOTE - INITIAL   Pharmacy Consult for Phenytoin Indication: Continuation of PTA phenytoin, seizures  Allergies  Allergen Reactions  . Heparin Other (See Comments)    Unknown reaction Not listed on [facility]    Patient Measurements:  Height: 3' 4.55" (103 cm) Weight: 146 lb 6.2 oz (66.4 kg) IBW/kg (Calculated) : 5.27   Vital Signs: Temp: 99.4 F (37.4 C) (12/31 0711) Temp src: Axillary (12/31 0711) BP: 103/60 mmHg (12/31 0830) Pulse Rate: 98  (12/31 0830)  Labs:  [facility] 07/01/12 0535 06/30/12 2335  WBC 10.9* 12.7*  HGB 11.6* 15.4  HCT 34.6* 44.6  PLT 132* 185  APTT 38* --  CREATININE 0.84 1.08  LABCREA -- --  CREATININE 0.84 1.08  CREAT24HRUR -- --  MG -- --  PHOS -- --  ALBUMIN 2.6* 3.9  PROT 5.6* 8.3  ALBUMIN 2.6* 3.9  AST 73* 136*  ALT 116* 189*  ALKPHOS 50 76  BILITOT 0.5 0.5  BILIDIR -- --  IBILI -- --   Estimated Creatinine Clearance: 79.8 ml/min (by C-G formula based on Cr of 0.84).  Medications:  Scheduled:    . acetaminophen  650 mg Rectal Once  . famotidine  20 mg Oral BID  . insulin aspart  2-6 Units Subcutaneous Q4H  . levETIRAcetam  300 mg Oral BID  . [COMPLETED] norepinephrine (LEVOPHED) Adult infusion  2-50 mcg/min Intravenous Once  . oxybutynin  5 mg Oral BID  . [COMPLETED] vancomycin  1,000 mg Intravenous Once  . [DISCONTINUED] vancomycin  1,000 mg Intravenous Once    Assessment:  66 YOM from nursing facility to ED on 12/31 w/ reports of seizures (known hx of seizures) and AMS, found to be in septic shock.  PTA phenytoin was reportedly 100mg  PO BID with last dose on 12/30.  SCr 0.84 appears wnl, but consider b/l AKA may overestimate renal function.  Phenytoin total level (< 2.5) below goal.  Corrected phenytoin level for low albumin ~4 (using level of 2.5)  RN reports poor ability to take PO medications  Goal of Therapy:  Therapeutic phenytoin level (Free phenytoin: 1-2.5 mcg/mL, Total phenytoin: 10-20  mcg/ml)  Plan:   Loading dose 15 mg/kg = 1g IV x 1 dose  Phenytoin level (trough) 07/03/11 after loading dose.  Maintenance dose 100mg  IV Q8h.  Obtain phenytoin levels as needed to monitor therapy.   Lynann Beaver PharmD, BCPS Pager [phone] 07/01/2012 1:00 PM

## 2012-07-02 ENCOUNTER — Inpatient Hospital Stay (HOSPITAL_COMMUNITY): Payer: Medicare Other

## 2012-07-02 LAB — COMPREHENSIVE METABOLIC PANEL
ALT: 54 U/L — ABNORMAL HIGH (ref 0–53)
AST: 28 U/L (ref 0–37)
AST: 22 U/L (ref 0–37)
Albumin: 2.5 g/dL — ABNORMAL LOW (ref 3.5–5.2)
Alkaline Phosphatase: 47 U/L (ref 39–117)
BUN: 6 mg/dL (ref 6–23)
BUN: 4 mg/dL — ABNORMAL LOW (ref 6–23)
CO2: 22 mEq/L (ref 19–32)
Calcium: 7.4 mg/dL — ABNORMAL LOW (ref 8.4–10.5)
Chloride: 110 mEq/L (ref 96–112)
Creatinine, Ser: 0.6 mg/dL (ref 0.50–1.35)
GFR calc Af Amer: >90 mL/min (ref >90)
GFR calc non Af Amer: >90 mL/min (ref >90)
Glucose, Bld: 113 mg/dL — ABNORMAL HIGH (ref 70–99)
Potassium: 2.8 mEq/L — ABNORMAL LOW (ref 3.5–5.1)
Sodium: 143 mEq/L (ref 135–145)
Total Bilirubin: 0.6 mg/dL (ref 0.3–1.2)
Total Protein: 5.6 g/dL — ABNORMAL LOW (ref 6.0–8.3)

## 2012-07-02 LAB — GLUCOSE, CAPILLARY
Glucose-Capillary: 120 mg/dL — ABNORMAL HIGH (ref 70–99)
Glucose-Capillary: 103 mg/dL — ABNORMAL HIGH (ref 70–99)

## 2012-07-02 LAB — CBC
Hemoglobin: 11.3 g/dL — ABNORMAL LOW (ref 13.0–17.0)
Platelets: 124 10*3/uL — ABNORMAL LOW (ref 150–400)
RBC: 3.74 MIL/uL — ABNORMAL LOW (ref 4.22–5.81)
WBC: 13.2 10*3/uL — ABNORMAL HIGH (ref 4.0–10.5)

## 2012-07-02 LAB — HEPATITIS PANEL, ACUTE
HCV Ab: NEGATIVE
Hepatitis B Surface Ag: NEGATIVE

## 2012-07-02 LAB — PHENYTOIN LEVEL, TOTAL: Phenytoin Lvl: 12.8 ug/mL (ref 10.0–20.0)

## 2012-07-02 LAB — PROCALCITONIN: Procalcitonin: 1.53 ng/mL

## 2012-07-02 MED ORDER — POTASSIUM CHLORIDE 20 MEQ/15ML (10%) PO LIQD
40.0000 meq | ORAL | Status: AC
  Administered 2012-07-02: 40 meq
  Filled 2012-07-02 (×2): qty 30

## 2012-07-02 MED ORDER — LEVOFLOXACIN IN D5W 750 MG/150ML IV SOLN
750.0000 mg | INTRAVENOUS | Status: AC
  Administered 2012-07-03: 750 mg via INTRAVENOUS
  Filled 2012-07-02: qty 150

## 2012-07-02 MED ORDER — VANCOMYCIN HCL IN DEXTROSE 1-5 GM/200ML-% IV SOLN
1000.0000 mg | INTRAVENOUS | Status: DC
  Administered 2012-07-03 – 2012-07-07 (×5): 1000 mg via INTRAVENOUS
  Filled 2012-07-02 (×5): qty 200

## 2012-07-02 NOTE — Progress Notes (Signed)
MEDICATION RELATED CONSULT NOTE - INITIAL   Pharmacy Consult for Phenytoin Indication: Continuation of PTA phenytoin, seizures  Allergies  Allergen Reactions  . Heparin Other (See Comments)    Hx of HIT in 2007    Patient Measurements:  Height: 3' 4.55" (103 cm) Weight: 148 lb 9.4 oz (67.4 kg) IBW/kg (Calculated) : 5.27   Vital Signs: Temp: 98.6 F (37 C) (01/01 1600) Temp src: Axillary (01/01 1600) BP: 93/62 mmHg (01/01 1700) Pulse Rate: 84  (01/01 1700)  Labs:  [facility] 07/02/12 0500 07/01/12 0535 06/30/12 2335  WBC 13.2* 10.9* 12.7*  HGB 11.3* 11.6* 15.4  HCT 32.0* 34.6* 44.6  PLT 124* 132* 185  APTT -- 38* --  CREATININE 0.60 0.84 1.08  LABCREA -- -- --  CREATININE 0.60 0.84 1.08  CREAT24HRUR -- -- --  MG -- -- --  PHOS -- -- --  ALBUMIN 2.5* 2.6* 3.9  PROT 5.5* 5.6* 8.3  ALBUMIN 2.5* 2.6* 3.9  AST 28 73* 136*  ALT 67* 116* 189*  ALKPHOS 46 50 76  BILITOT 0.5 0.5 0.5  BILIDIR -- -- --  IBILI -- -- --   Estimated Creatinine Clearance: 39.7 ml/min (by C-G formula based on Cr of 0.6).  Medications:  Scheduled:     . Chlorhexidine Gluconate Cloth  6 each Topical Q0600  . famotidine (PEPCID) IV  20 mg Intravenous Q12H  . insulin aspart  2-6 Units Subcutaneous Q4H  . levetiracetam  300 mg Intravenous Q12H  . levofloxacin (LEVAQUIN) IV  750 mg Intravenous Q48H  . mupirocin ointment  1 application Nasal BID  . [COMPLETED] phenytoin (DILANTIN) IV  1,000 mg Intravenous Once  . phenytoin (DILANTIN) IV  100 mg Intravenous Q8H  . piperacillin-tazobactam (ZOSYN)  IV  3.375 g Intravenous Q8H  . potassium chloride  40 mEq Per Tube Q4H  . vancomycin  1,000 mg Intravenous Q24H  . [DISCONTINUED] levofloxacin (LEVAQUIN) IV  750 mg Intravenous Q24H  . [DISCONTINUED] vancomycin  750 mg Intravenous Q12H    Assessment:  66 YOM from nursing facility to ED on 12/31 w/ reports of seizures (known hx of seizures) and AMS, found to be in septic shock.  PTA phenytoin was  reportedly 100mg  PO BID with last dose on 12/30.  SCr 0.84 appears wnl, but consider b/l AKA may overestimate renal function.  Phenytoin total level = 12.8, corrects to 21 for albumin  Goal of Therapy:  Therapeutic phenytoin level (Free phenytoin: 1-2.5 mcg/mL, Total phenytoin: 10-20 mcg/ml)  Plan:   Phenytoin level slightly elevated today, suspect result from be loaded 12/31 - no need to adjust dose at this time, would recheck level in with am labs in 2-3 days  Continue Maintenance dose 100mg  IV Q8h.  Obtain phenytoin levels as needed to monitor therapy.  Juliette Alcide, PharmD, BCPS.   Pager: [phone]  07/02/2012 5:08 PM

## 2012-07-02 NOTE — Progress Notes (Signed)
ANTIBIOTIC CONSULT NOTE - FOLLOW UP  Pharmacy Consult for Vancomycin, Zosyn, Levaquin Indication: r/o sepsis  Allergies  Allergen Reactions  . Heparin Other (See Comments)    Hx of HIT in 2007    Patient Measurements: Height: 3' 4.55" (103 cm) Weight: 148 lb 9.4 oz (67.4 kg) IBW/kg (Calculated) : 5.27  +++  Note bilateral AKA +++  Vital Signs: Temp: 98.5 F (36.9 C) (01/01 0800) Temp src: Axillary (01/01 0800) BP: 105/58 mmHg (01/01 0900) Pulse Rate: 79  (01/01 0900) Intake/Output from previous day: 12/31 0701 - 01/01 0700 In: 4141.3 [I.V.:3024.8; IV Piggyback:1116.5] Out: 5400 [Urine:5400]  Labs:  [facility] 07/02/12 0500 07/01/12 0535 06/30/12 2335  WBC 13.2* 10.9* 12.7*  HGB 11.3* 11.6* 15.4  PLT 124* 132* 185  LABCREA -- -- --  CREATININE 0.60 0.84 1.08   Estimated Creatinine Clearance: 39.7 ml/min (by C-G formula based on Cr of 0.6).   Microbiology: Recent Results (from the past 720 hour(s))  URINE CULTURE     Status: Normal (Preliminary result)   Collection Time   07/01/12 12:15 AM      Component Value Range Status Comment   Specimen Description URINE, CATHETERIZED   Final    Special Requests NONE   Final    Culture  Setup Time 07/01/2012 07:34   Final    Colony Count >=100,000 COLONIES/ML   Final    Culture PROTEUS MIRABILIS   Final    Report Status PENDING   Incomplete   URINE CULTURE     Status: Normal (Preliminary result)   Collection Time   07/01/12  5:45 AM      Component Value Range Status Comment   Specimen Description URINE, CATHETERIZED   Final    Special Requests NONE   Final    Culture  Setup Time 07/01/2012 09:04   Final    Colony Count PENDING   Incomplete    Culture Culture reincubated for better growth   Final    Report Status PENDING   Incomplete   MRSA PCR SCREENING     Status: Abnormal   Collection Time   07/01/12 11:03 AM      Component Value Range Status Comment   MRSA by PCR POSITIVE (*) NEGATIVE Final     Anti-infectives      Start     Dose/Rate Route Frequency Ordered Stop   07/01/12 1800   vancomycin (VANCOCIN) 750 mg in sodium chloride 0.9 % 150 mL IVPB        750 mg 150 mL/hr over 60 Minutes Intravenous Every 12 hours 07/01/12 0659     07/01/12 1400  piperacillin-tazobactam (ZOSYN) IVPB 3.375 g       3.375 g 12.5 mL/hr over 240 Minutes Intravenous 3 times per day 07/01/12 0659     07/01/12 0530  piperacillin-tazobactam (ZOSYN) IVPB 3.375 g       3.375 g 100 mL/hr over 30 Minutes Intravenous  Once 07/01/12 0524 07/01/12 0647   07/01/12 0530   vancomycin (VANCOCIN) IVPB 1000 mg/200 mL premix  Status:  Discontinued        1,000 mg 200 mL/hr over 60 Minutes Intravenous  Once 07/01/12 0524 07/01/12 0530   07/01/12 0015   ceFEPIme (MAXIPIME) 1 g in dextrose 5 % 50 mL IVPB  Status:  Discontinued        1 g 100 mL/hr over 30 Minutes Intravenous 3 times per day 07/01/12 0006 07/01/12 0549   07/01/12 0015   levofloxacin (LEVAQUIN) IVPB 750 mg  750 mg 100 mL/hr over 90 Minutes Intravenous Every 24 hours 07/01/12 0006 07/03/12 2159   07/01/12 0015   vancomycin (VANCOCIN) IVPB 1000 mg/200 mL premix        1,000 mg 200 mL/hr over 60 Minutes Intravenous  Once 07/01/12 0009 07/01/12 0317          Assessment:  66 YOM from nursing facility to ED on 12/31 w/ reports of seizures (known hx of seizures) and AMS, found to be in septic shock  Day #2 Vancomycin, Zosyn, and Levaquin.   Urine Cxt (07/01/12) > 100k Proteus (sens pending)   SCr decreased, but updated height and weight reduce calculated CrCl ~ 40 ml/min (CG), requiring abx dose adjustment.  Goal of Therapy:  Vancomycin trough level 15-20 mcg/ml Appropriate abx dosing, eradication of infection.  Plan:   Zosyn 3.375g IV Q8H infused over 4hrs.  Levaquin 750mg  IV q48h  Vancomycin 1g IV q24h.  Measure Vanc trough at steady state.  Follow up renal fxn and culture results.   Lynann Beaver PharmD, BCPS Pager [phone] 07/02/2012 9:22  AM

## 2012-07-02 NOTE — Progress Notes (Signed)
SLP Cancellation Note  Patient Details Name: [name] MRN: [id number] DOB: [birth year]   Cancelled treatment:      Received order for SLP swallow eval, will address order on 07/03/12.    Oneida Mckamey, Riley Nearing 07/02/2012, 4:30 PM

## 2012-07-02 NOTE — Progress Notes (Signed)
PULMONARY  / CRITICAL CARE MEDICINE  Name: [name] MRN: [id number] DOB: Feb 22, 1946    LOS: 2  REFERRING MD :  Norlene Campbell, EDP  CHIEF COMPLAINT:  Seizure, hypotension  BRIEF PATIENT DESCRIPTION: 67 y/o male with known seizure disorder presented to the [facility] ED on 12/31 with seizures and septic shock presumed from a UTI.  He was found to be hypotensive despite 5L NS so PCCM consulted for admission.    LINES / TUBES: 12/31 R IJ CVL>> Suprapubic cath>>  CULTURES: 12/31 blood x2 >> 12/31 urine >> Proteus >>  12/31 procalcitonin>> 12/31 flu >> negative  ANTIBIOTICS: 12/31 vanc >> 12/31 zosyn >> 12/31 levaquin >> 12/31 cefepime x1 12/31 levaquin x1  SIGNIFICANT EVENTS:  12/31  Admitted with septic shock in setting of UTI and encephalopathy / seizures  LEVEL OF CARE:  ICU  PRIMARY SERVICE:  PCCM  CONSULTANTS:    CODE STATUS: Full  DIET:  NPO  DVT Px:  SCDs  GI Px:  Pepcid  HISTORY OF PRESENT ILLNESS:  67 y/o male with known seizure disorder presented to the [facility] ED on 12/31 with seizures and septic shock presumed from a UTI.  He was found to be hypotensive despite 5L NS so PCCM consulted for admission.  INTERVAL HISTORY:  Remains on low dose norepi More awake, dysarthric but attempting to speak   VITAL SIGNS: Temp:  [97.3 F (36.3 C)-99.8 F (37.7 C)] 98.5 F (36.9 C) (01/01 0800) Pulse Rate:  [69-89] 79  (01/01 0900) Resp:  [14-21] 18  (01/01 0900) BP: (75-121)/(50-73) 105/58 mmHg (01/01 0900) SpO2:  [100 %] 100 % (01/01 0900) Weight:  [66.4 kg (146 lb 6.2 oz)-67.4 kg (148 lb 9.4 oz)] 67.4 kg (148 lb 9.4 oz) (01/01 0345)  HEMODYNAMICS: CVP:  [4 mmHg-6 mmHg] 4 mmHg  VENTILATOR SETTINGS:    INTAKE / OUTPUT: Intake/Output      12/31 0701 - 01/01 0700 01/01 0701 - 01/02 0700   I.V. (mL/kg) 3024.8 (44.9)    IV Piggyback 1116.5    Total Intake(mL/kg) 4141.3 (61.4)    Urine (mL/kg/hr) 5400 (3.3)    Total Output 5400    Net -1258.7         Stool  Occurrence 1 x      PHYSICAL EXAMINATION: General:  No acute distress Neuro:  Awake, dysarthric, repeats words, poorly oriented, moves all ext. Left vp  Shunt noted HEENT:  PERRL Cardiovascular:  RRR, no murmurs Lungs:  Scattered rhonchi Abdomen:  Soft, nontender, chronic suprapubic catheter Musculoskeletal:  Bilateral AKA Skin:  Intact   LABS:  Lab 07/02/12 0500 07/01/12 2209 07/01/12 1530 07/01/12 1040 07/01/12 0728 07/01/12 0555 07/01/12 0535 06/30/12 2335  HGB 11.3* -- -- -- -- -- 11.6* 15.4  WBC 13.2* -- -- -- -- -- 10.9* 12.7*  PLT 124* -- -- -- -- -- 132* 185  NA 141 -- -- -- -- -- 141 142  K 2.6* -- -- -- -- -- 3.3* --  CL 111 -- -- -- -- -- 115* 104  CO2 22 -- -- -- -- -- 19 21  GLUCOSE 113* -- -- -- -- -- 142* 158*  BUN 6 -- -- -- -- -- 21 33*  CREATININE 0.60 -- -- -- -- -- 0.84 1.08  CALCIUM 7.4* -- -- -- -- -- 7.1* 9.1  MG -- -- -- -- -- -- -- --  PHOS -- -- -- -- -- -- -- --  AST 28 -- -- -- -- -- 73*  136*  ALT 67* -- -- -- -- -- 116* 189*  ALKPHOS 46 -- -- -- -- -- 50 76  BILITOT 0.5 -- -- -- -- -- 0.5 0.5  PROT 5.5* -- -- -- -- -- 5.6* 8.3  ALBUMIN 2.5* -- -- -- -- -- 2.6* 3.9  INR -- -- -- -- -- -- 1.68* --  APTT -- -- -- -- -- -- 38* --  INR -- -- -- -- -- -- 1.68* --  LATICACIDVEN -- 0.7 0.9 -- 1.4 1.3 -- 1.4  TROPONINI -- <0.30 <0.30 -- 0.37* -- 0.38* --  PHART -- -- -- -- -- -- -- --  PCO2ART -- -- -- -- -- -- -- --  PO2ART -- -- -- -- -- -- -- --  HCO3 -- -- -- -- -- -- -- --  O2SAT -- -- -- 83.2 -- -- -- --   IMAGING: 1/1  PCXR >> no change bilateral basilar airspace disease 12/31  Abdomen US >> Suspected hepatitis, cholelithiasis, suspected medical renal disease  ECG: 12/31  12 lead ECG >>> Sinus tachycardia, 1st degree AV block, diffuse ST-T changes  DIAGNOSES: Active Problems:  CONVULSIONS, SEIZURES, NOS  UTI (lower urinary tract infection)  HCAP (healthcare-associated pneumonia)  Septic shock  Acute encephalopathy  Coagulopathy   Hypokalemia  Hyperglycemia  ASSESSMENT / PLAN:  PULMONARY  A:  Possible HCAP. P:   Goal SpO2 > 92 Supplemental oxygen PRN Follow CXR Abx as per ID section  CARDIOVASCULAR  A: Septic shock in setting of UTI / chronic suprapubic catheter, possible HCAP and severe dehydration. P:  Goal MAP 65 >> change goal to systolic > 90  EGDT protocol completed  RENAL  A:  Severe dehydration. P:   Goal CVP> 10 NS boluses per EGDT protocol  GASTROINTESTINAL  A:  Sonographic evidence of hepatitis.  Elevated transaminases >> improving.  Cholelithiasis without evidence of cholecystitis. P:   NPO until a swallow eval Acute hepatitis serology sent and pending    HEMATOLOGIC  A:  Anemia.  Thrombocytopenia.  Coagulopathy. P:  Trend CBC Trend APTT / INR  INFECTIOUS  A:  UTI.  Possible HCAP. Pct equivical (2.05) P:   levaquin + vanco + zosyn, can likely d/c the zosyn when cx data available, pressor off  ENDOCRINE  A:  DM.  Hyperglycemia. P:   Glycemic control protocol, Phase 1  NEUROLOGIC  A:  Acute encephalopathy.  Seizures. Hx of Left VP shunt. CT head stable 12/31 P:   -keppra   I have personally obtained a history, examined the patient, evaluated laboratory and imaging results, formulated the assessment and plan and placed orders. CRITICAL CARE: The patient is critically ill with multiple organ systems failure and requires high complexity decision making for assessment and support, frequent evaluation and titration of therapies, application of advanced monitoring technologies and extensive interpretation of multiple databases. Critical Care Time devoted to patient care services described in this note is 30 minutes.    Levy Pupa, MD, PhD 07/02/2012, 9:56 AM [facility] [phone] or if no answer [phone]

## 2012-07-03 LAB — CBC
HCT: 30.4 % — ABNORMAL LOW (ref 39.0–52.0)
Hemoglobin: 11 g/dL — ABNORMAL LOW (ref 13.0–17.0)
MCHC: 36.2 g/dL — ABNORMAL HIGH (ref 30.0–36.0)
RDW: 13 % (ref 11.5–15.5)
WBC: 9.4 10*3/uL (ref 4.0–10.5)

## 2012-07-03 LAB — COMPREHENSIVE METABOLIC PANEL
ALT: 48 U/L (ref 0–53)
Alkaline Phosphatase: 46 U/L (ref 39–117)
BUN: 3 mg/dL — ABNORMAL LOW (ref 6–23)
CO2: 25 mEq/L (ref 19–32)
Calcium: 8 mg/dL — ABNORMAL LOW (ref 8.4–10.5)
GFR calc Af Amer: >90 mL/min (ref >90)
GFR calc non Af Amer: >90 mL/min (ref >90)
Glucose, Bld: 95 mg/dL (ref 70–99)
Total Protein: 5.4 g/dL — ABNORMAL LOW (ref 6.0–8.3)

## 2012-07-03 LAB — GLUCOSE, CAPILLARY
Glucose-Capillary: 119 mg/dL — ABNORMAL HIGH (ref 70–99)
Glucose-Capillary: 76 mg/dL (ref 70–99)

## 2012-07-03 LAB — URINE CULTURE

## 2012-07-03 MED ORDER — CHLORHEXIDINE GLUCONATE CLOTH 2 % EX PADS
6.0000 | MEDICATED_PAD | Freq: Every day | CUTANEOUS | Status: AC
  Administered 2012-07-03 – 2012-07-06 (×4): 6 via TOPICAL

## 2012-07-03 MED ORDER — POTASSIUM CHLORIDE 10 MEQ/100ML IV SOLN
10.0000 meq | INTRAVENOUS | Status: AC
  Administered 2012-07-03 (×6): 10 meq via INTRAVENOUS
  Filled 2012-07-03: qty 600

## 2012-07-03 MED ORDER — BIOTENE DRY MOUTH MT LIQD: 15.0000 mL | OROMUCOSAL | Status: DC | PRN

## 2012-07-03 NOTE — Progress Notes (Signed)
eLink Physician-Brief Progress Note Patient Name: [name] DOB: 1946/05/14 MRN: [id number]  Date of Service  07/03/2012   HPI/Events of Note  Low k   eICU Interventions  supp iv, not on orals   Intervention Category Major Interventions: Electrolyte abnormality - evaluation and management  Nelda Bucks. 07/03/2012, 5:41 AM

## 2012-07-03 NOTE — Progress Notes (Signed)
INITIAL NUTRITION ASSESSMENT  DOCUMENTATION CODES Per approved criteria  -Not Applicable   INTERVENTION: 1.  General healthful diet; encourage intake as able.  Supplements to be considered if intake suboptimal  NUTRITION DIAGNOSIS: Swallowing difficulty related to oral deficits, poor dentition as evidenced by pt requiring dysphagia/modified diet.    Monitor:  1.  Food/Beverage; PO intake >50% of meals  Reason for Assessment: MST  67 y.o. male  Admitting Dx:   ASSESSMENT: Pt with h/o bilateral AKA admitted with seizure and hypotension.  Pt with septic shock from UTI.   Pt NPO since admission, was seen by SLP this afternoon and diet advanced to puree, thin liquids. Pt not oriented at this time.  RD to follow for nutritional adequacy.   Height: Ht Readings from Last 1 Encounters:  07/01/12 3' 4.55" (1.03 m)  172.7 cm prior to AKAs  Weight: Wt Readings from Last 1 Encounters:  07/03/12 145 lb 15.1 oz (66.2 kg)    Ideal Body Weight: ~60 kg  % Ideal Body Weight: 111%  Wt Readings from Last 10 Encounters:  07/03/12 145 lb 15.1 oz (66.2 kg)    Usual Body Weight: unable to assess  Estimated Nutritional Needs: Kcal: 4098-1191 Protein: 55-73 Fluid: >1.5 Ld/ay  Skin: healed ulcers, no wounds specified  Diet Order: NPO  EDUCATION NEEDS: -Education not appropriate at this time   Intake/Output Summary (Last 24 hours) at 07/03/12 1515 Last data filed at 07/03/12 1500  Gross per 24 hour  Intake   2550 ml  Output   4525 ml  Net  -1975 ml    Last BM: 12/31  Labs:   Lab 07/03/12 0400 07/02/12 2030 07/02/12 0500  NA 143 143 141  K 2.7* 2.8* 2.6*  CL 109 110 111  CO2 25 25 22   BUN 3* 4* 6  CREATININE 0.58 0.57 0.60  CALCIUM 8.0* 7.9* 7.4*  MG -- 1.3* --  PHOS -- -- --  GLUCOSE 95 104* 113*    CBG (last 3)   Basename 07/03/12 1159 07/03/12 0809 07/03/12 0317  GLUCAP 126* 114* 93    Scheduled Meds:   . Chlorhexidine Gluconate Cloth  6 each  Topical Q0600  . famotidine (PEPCID) IV  20 mg Intravenous Q12H  . insulin aspart  2-6 Units Subcutaneous Q4H  . levetiracetam  300 mg Intravenous Q12H  . levofloxacin (LEVAQUIN) IV  750 mg Intravenous Q48H  . mupirocin ointment  1 application Nasal BID  . phenytoin (DILANTIN) IV  100 mg Intravenous Q8H  . piperacillin-tazobactam (ZOSYN)  IV  3.375 g Intravenous Q8H  . vancomycin  1,000 mg Intravenous Q24H    Continuous Infusions:   . sodium chloride 100 mL/hr at 07/03/12 1053    Past Medical History  Diagnosis Date  . Diabetes mellitus   . Orchitis and epididymitis in disease classified elsewhere   . Unspecified intestinal obstruction   . Unspecified osteomyelitis, site unspecified   . Encounter for long-term (current) use of other medications   . Hypertrophy of prostate without urinary obstruction and other lower urinary tract symptoms (LUTS)   . Depressive disorder, not elsewhere classified   . Edema of penis   . Pulmonary congestion and hypostasis   . Other ascites   . Thrombocytopenia   . Urinary tract infection, site not specified   . Chronic ulcer of other specified site   . Acute, but ill-defined, cerebrovascular disease   . Intestinal infection due to E. coli, unspecified   . Other convulsions   .  Unspecified essential hypertension   . Urinary tract infection, site not specified   . Acute upper respiratory infections of unspecified site   . Other chronic pain   . Unspecified psychosis   . Unspecified constipation   . Acute kidney failure, unspecified     Past Surgical History  Procedure Date  . 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 Vp shunt placement 1990  . Circumcision 2007  . Suprapubic catheter placement 2007    Loyce Dys, MS RD LDN Clinical Inpatient Dietitian Pager: [phone] Weekend/After hours pager: [phone]

## 2012-07-03 NOTE — Progress Notes (Signed)
PULMONARY  / CRITICAL CARE MEDICINE  Name: [name] MRN: [id number] DOB: April 13, 1946    LOS: 3  REFERRING MD :  Norlene Campbell, EDP  CHIEF COMPLAINT:  Seizure, hypotension  BRIEF PATIENT DESCRIPTION: 67 y/o male with known seizure disorder presented to the [facility] ED on 12/31 with seizures and septic shock presumed from a UTI.  He was found to be hypotensive despite 5L NS so PCCM consulted for admission.    LINES / TUBES: 12/31 R IJ CVL>> Suprapubic cath>>  CULTURES: 12/31 blood x2 >> 12/31 urine >> Proteus >>  12/31 flu >> negative  ANTIBIOTICS: 12/31 vanc >> 12/31 zosyn >> 12/31 levaquin >> 12/31 cefepime x1 12/31 levaquin x1  SIGNIFICANT EVENTS:  12/31  Admitted with septic shock in setting of UTI and encephalopathy / seizures  LEVEL OF CARE:  ICU PRIMARY SERVICE:  PCCM CONSULTANTS:   CODE STATUS: Full DIET:  NPO DVT Px:  SCDs GI Px:  Pepcid  INTERVAL HISTORY:  Off pressors.  More awake, ? Close to his mental baseline  VITAL SIGNS: Temp:  [97.8 F (36.6 C)-98.6 F (37 C)] 97.8 F (36.6 C) (01/02 0800) Pulse Rate:  [70-110] 87  (01/02 0600) Resp:  [15-29] 20  (01/02 0600) BP: (80-128)/(45-90) 107/59 mmHg (01/02 0530) SpO2:  [93 %-100 %] 100 % (01/02 0600) Weight:  [145 lb 15.1 oz (66.2 kg)] 145 lb 15.1 oz (66.2 kg) (01/02 0500)  HEMODYNAMICS: CVP:  [7 mmHg] 7 mmHg  INTAKE / OUTPUT: Intake/Output      01/01 0701 - 01/02 0700 01/02 0701 - 01/03 0700   I.V. (mL/kg) 2100 (31.7)    IV Piggyback 650    Total Intake(mL/kg) 2750 (41.5)    Urine (mL/kg/hr) 2875 (1.8) 700   Total Output 2875 700   Net -125 -700          PHYSICAL EXAMINATION: General:  No acute distress Neuro:  Awake, dysarthric, repeats words, poorly oriented, moves all ext. Left vp  Shunt noted HEENT:  PERRL Cardiovascular:  RRR, no murmurs Lungs:  resps even non labored on [place], Scattered rhonchi Abdomen:  Soft, nontender, chronic suprapubic catheter Musculoskeletal:  Bilateral AKA Skin:   Intact   LABS:  Lab 07/03/12 0400 07/02/12 2030 07/02/12 0500 07/01/12 2209 07/01/12 1530 07/01/12 1040 07/01/12 0728 07/01/12 0555 07/01/12 0535 06/30/12 2335  HGB 11.0* -- 11.3* -- -- -- -- -- 11.6* 15.4  WBC 9.4 -- 13.2* -- -- -- -- -- 10.9* 12.7*  PLT 119* -- 124* -- -- -- -- -- 132* --  NA 143 143 141 -- -- -- -- -- 141 142  K 2.7* 2.8* -- -- -- -- -- -- -- --  CL 109 110 111 -- -- -- -- -- 115* 104  CO2 25 25 22  -- -- -- -- -- 19 21  GLUCOSE 95 104* 113* -- -- -- -- -- 142* 158*  BUN 3* 4* 6 -- -- -- -- -- 21 33*  CREATININE 0.58 0.57 0.60 -- -- -- -- -- 0.84 1.08  CALCIUM 8.0* 7.9* 7.4* -- -- -- -- -- 7.1* 9.1  MG -- 1.3* -- -- -- -- -- -- -- --  PHOS -- -- -- -- -- -- -- -- -- --  AST 20 22 28  -- -- -- -- -- 73* 136*  ALT 48 54* 67* -- -- -- -- -- 116* 189*  ALKPHOS 46 47 46 -- -- -- -- -- 50 76  BILITOT 0.6 0.6 0.5 -- -- -- -- --  0.5 0.5  PROT 5.4* 5.6* 5.5* -- -- -- -- -- 5.6* 8.3  ALBUMIN 2.5* 2.5* 2.5* -- -- -- -- -- 2.6* 3.9  INR -- -- -- -- -- -- -- -- 1.68* --  APTT -- -- -- -- -- -- -- -- 38* --  INR -- -- -- -- -- -- -- -- 1.68* --  LATICACIDVEN -- -- -- 0.7 0.9 -- 1.4 1.3 -- 1.4  TROPONINI -- -- -- <0.30 <0.30 -- 0.37* -- 0.38* --  PHART -- -- -- -- -- -- -- -- -- --  PCO2ART -- -- -- -- -- -- -- -- -- --  PO2ART -- -- -- -- -- -- -- -- -- --  HCO3 -- -- -- -- -- -- -- -- -- --  O2SAT -- -- -- -- -- 83.2 -- -- -- --   IMAGING: 1/1  PCXR >> no change bilateral basilar airspace disease 12/31  Abdomen US >> Suspected hepatitis, cholelithiasis, suspected medical renal disease 12/31 Vent shunt stable, no changes or acute findings   ECG: 12/31  12 lead ECG >>> Sinus tachycardia, 1st degree AV block, diffuse ST-T changes  DIAGNOSES: Active Problems:  CONVULSIONS, SEIZURES, NOS  UTI (lower urinary tract infection)  HCAP (healthcare-associated pneumonia)  Septic shock  Acute encephalopathy  Coagulopathy  Hypokalemia  Hyperglycemia  ASSESSMENT /  PLAN:  PULMONARY  A:  Possible HCAP. P:   Goal SpO2 > 92 Supplemental oxygen PRN Follow CXR Abx as per ID section  CARDIOVASCULAR  A: Septic shock in setting of UTI / chronic suprapubic catheter, possible HCAP and severe dehydration. P:  Off pressors  EGDT protocol completed CVP=6-8 Cont gentle volume for now   RENAL  A:  Severe dehydration. Hypokalemia  P:   Goal CVP> 10 Cont gentle volume  EGDT complete  Replete K PRN  F/u chem   GASTROINTESTINAL  A:  Sonographic evidence of hepatitis.  Elevated transaminases >> improving.  Acute hepatitis serology neg Cholelithiasis without evidence of cholecystitis. Malnutrition, Passed bedside swallow eval > diet P:   Modified diet F/u LFT's  HEMATOLOGIC  A:  Anemia.  Thrombocytopenia.  Coagulopathy. P:  Trend CBC Trend APTT / INR  INFECTIOUS  A:  UTI - proteus - sens pending.   Possible HCAP. Pct equivical (2.05) P:   levaquin + vanco + zosyn Narrow as able when culture data available   ENDOCRINE  A:  DM.  Hyperglycemia. P:   Glycemic control protocol, Phase 1  NEUROLOGIC  A:  Acute encephalopathy.  Unclear baseline MS, but suspect he is normalizing Seizures. Hx of Left VP shunt. CT head stable 12/31 P:   -Cont keppra/dilantin  -need to speak with son about pt's baseline, whether he is normalized.   Change to SDU status   WHITEHEART,KATHRYN, NP 07/03/2012  9:44 AM Pager: [phone] or [phone]  *Care during the described time interval was provided by me and/or other providers on the critical care team. I have reviewed this patient's available data, including medical history, events of note, physical examination and test results as part of my evaluation.  Levy Pupa, MD, PhD 07/03/2012, 10:40 AM [facility] [phone] or if no answer [phone]

## 2012-07-03 NOTE — Evaluation (Signed)
Clinical/Bedside Swallow Evaluation Patient Details  Name: [name] MRN: [id number] Date of Birth: 05-Apr-1946  Today's Date: 07/03/2012 Time: 0900-0929 SLP Time Calculation (min): 29 min  Past Medical History:  Past Medical History  Diagnosis Date  . Diabetes mellitus   . Orchitis and epididymitis in disease classified elsewhere   . Unspecified intestinal obstruction   . Unspecified osteomyelitis, site unspecified   . Encounter for long-term (current) use of other medications   . Hypertrophy of prostate without urinary obstruction and other lower urinary tract symptoms (LUTS)   . Depressive disorder, not elsewhere classified   . Edema of penis   . Pulmonary congestion and hypostasis   . Other ascites   . Thrombocytopenia   . Urinary tract infection, site not specified   . Chronic ulcer of other specified site   . Acute, but ill-defined, cerebrovascular disease   . Intestinal infection due to E. coli, unspecified   . Other convulsions   . Unspecified essential hypertension   . Urinary tract infection, site not specified   . Acute upper respiratory infections of unspecified site   . Other chronic pain   . Unspecified psychosis   . Unspecified constipation   . Acute kidney failure, unspecified    Past Surgical History:  Past Surgical History  Procedure Date  . 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 Vp shunt placement 1990  . Circumcision 2007  . Suprapubic catheter placement 2007   HPI:   67 y/o male with known seizure disorder presented to the [facility] ED on 12/31 with seizures and septic shock presumed from a UTI.  He was found to be hypotensive despite 5L NS so PCCM consulted.  Pt with ? HCAP.  CXR 07/02/12 showed lung base density, right more than left.  Pt had a PEG tube at one time due to severe dysphagia, h/o CHI, BKA.  CT Head stable 07/01/12.  .    Assessment / Plan / Recommendation Clinical Impression  Pt presents with mild oral phase dysphagia  characterized by lingual thrusting, discoordination, decreased labial closure on spoon requiring SLP to place bolus in anterior oral cavity with  delayed oral transit and anterior oral spillage (right).   No clinical indications of aspiration with po however.  SLP can not rule out overt silent aspiration at bedside but do not suspect is occuring.  Rec puree.thin due to lack of dentition and pt's oral deficits.  SLP to follow brielfy to assure tolerance.     Aspiration Risk  Moderate    Diet Recommendation Dysphagia 1 (Puree);Thin liquid   Liquid Administration via: Straw Medication Administration: Crushed with puree Supervision: Staff feed patient Compensations: Slow rate;Small sips/bites;Check for pocketing;Check for anterior loss Postural Changes and/or Swallow Maneuvers: Seated upright 90 degrees;Upright 30-60 min after meal    Other  Recommendations     Follow Up Recommendations       Frequency and Duration min 2x/week  2 weeks   Pertinent Vitals/Pain Afebrile, decreased    SLP Swallow Goals Patient will utilize recommended strategies during swallow to increase swallowing safety with: Total assistance   Swallow Study Prior Functional Status   unknown    General HPI:  67 y/o male with known seizure disorder presented to the [facility] ED on 12/31 with seizures and septic shock presumed from a UTI.  He was found to be hypotensive despite 5L NS so PCCM consulted.  Pt with ? HCAP.  CXR 07/02/12 showed lung  base density, right more than left.  Pt had a PEG tube at one time due to severe dysphagia, h/o CHI, BKA.  CT Head stable 07/01/12.  Marland Kitchen  Previous Swallow Assessment: MBS severe dysphagia Diet Prior to this Study: NPO Temperature Spikes Noted: No Respiratory Status: Supplemental O2 delivered via (comment) History of Recent Intubation: No Behavior/Cognition: Alert;Uncooperative;Distractible;Doesn't follow directions Oral Cavity - Dentition: Edentulous Self-Feeding Abilities: Total  assist Patient Positioning: Upright in bed Baseline Vocal Quality: Clear Volitional Cough: Cognitively unable to elicit Volitional Swallow: Unable to elicit    Oral/Motor/Sensory Function Overall Oral Motor/Sensory Function:  (pt did not follow directions during eval) Labial Strength: Reduced (spill right)   Ice Chips Ice chips: Not tested   Thin Liquid Thin Liquid: Impaired Presentation: Straw Oral Phase Impairments: Reduced lingual movement/coordination Pharyngeal  Phase Impairments: Multiple swallows    Nectar Thick Nectar Thick Liquid: Not tested   Honey Thick Honey Thick Liquid: Not tested   Puree Puree: Impaired Presentation: Spoon Oral Phase Impairments: Reduced labial seal;Impaired anterior to posterior transit;Reduced lingual movement/coordination Oral Phase Functional Implications: Right anterior spillage Pharyngeal Phase Impairments: Multiple swallows   Solid   GO    Solid: Not tested (due to edentulous status)      Donavan Burnet, MS [facility] SLP [phone]

## 2012-07-04 DIAGNOSIS — A4159 Other Gram-negative sepsis: Principal | ICD-10-CM

## 2012-07-04 DIAGNOSIS — A419 Sepsis, unspecified organism: Secondary | ICD-10-CM

## 2012-07-04 LAB — COMPREHENSIVE METABOLIC PANEL
Albumin: 2.3 g/dL — ABNORMAL LOW (ref 3.5–5.2)
Alkaline Phosphatase: 43 U/L (ref 39–117)
BUN: <3 mg/dL — ABNORMAL LOW (ref 6–23)
Potassium: 2.9 mEq/L — ABNORMAL LOW (ref 3.5–5.1)
Sodium: 141 mEq/L (ref 135–145)
Total Protein: 5.4 g/dL — ABNORMAL LOW (ref 6.0–8.3)

## 2012-07-04 LAB — GLUCOSE, CAPILLARY
Glucose-Capillary: 111 mg/dL — ABNORMAL HIGH (ref 70–99)
Glucose-Capillary: 132 mg/dL — ABNORMAL HIGH (ref 70–99)
Glucose-Capillary: 88 mg/dL (ref 70–99)
Glucose-Capillary: 95 mg/dL (ref 70–99)

## 2012-07-04 LAB — PROTIME-INR: Prothrombin Time: 17.3 seconds — ABNORMAL HIGH (ref 11.6–15.2)

## 2012-07-04 LAB — CBC
HCT: 29.5 % — ABNORMAL LOW (ref 39.0–52.0)
MCHC: 35.3 g/dL (ref 30.0–36.0)
RDW: 13.2 % (ref 11.5–15.5)

## 2012-07-04 MED ORDER — SODIUM CHLORIDE 0.9 % IJ SOLN
10.0000 mL | INTRAMUSCULAR | Status: DC | PRN
  Administered 2012-07-05: 10 mL
  Administered 2012-07-06 (×2): 20 mL
  Administered 2012-07-06 – 2012-07-08 (×4): 10 mL

## 2012-07-04 MED ORDER — PHENYTOIN SODIUM EXTENDED 100 MG PO CAPS
100.0000 mg | ORAL_CAPSULE | Freq: Two times a day (BID) | ORAL | Status: DC
  Administered 2012-07-04 – 2012-07-06 (×4): 100 mg via ORAL
  Filled 2012-07-04 (×10): qty 1

## 2012-07-04 MED ORDER — LEVETIRACETAM 100 MG/ML PO SOLN
300.0000 mg | Freq: Two times a day (BID) | ORAL | Status: DC
  Administered 2012-07-04 – 2012-07-06 (×4): 300 mg via ORAL
  Filled 2012-07-04 (×10): qty 5

## 2012-07-04 MED ORDER — FAMOTIDINE 20 MG PO TABS
20.0000 mg | ORAL_TABLET | Freq: Two times a day (BID) | ORAL | Status: DC
  Administered 2012-07-04 – 2012-07-06 (×5): 20 mg via ORAL
  Filled 2012-07-04 (×10): qty 1

## 2012-07-04 MED ORDER — POTASSIUM CHLORIDE 10 MEQ/100ML IV SOLN
10.0000 meq | INTRAVENOUS | Status: AC
  Administered 2012-07-04 (×4): 10 meq via INTRAVENOUS
  Filled 2012-07-04 (×4): qty 100

## 2012-07-04 MED ORDER — INSULIN ASPART 100 UNIT/ML ~~LOC~~ SOLN
0.0000 [IU] | Freq: Three times a day (TID) | SUBCUTANEOUS | Status: DC
  Administered 2012-07-05: 1 [IU] via SUBCUTANEOUS

## 2012-07-04 NOTE — Progress Notes (Signed)
PULMONARY  / CRITICAL CARE MEDICINE  Name: [name] MRN: [id number] DOB: [birth year]    LOS: 4  REFERRING MD :  Norlene Campbell, EDP  CHIEF COMPLAINT:  Seizure, hypotension  BRIEF PATIENT DESCRIPTION: 67 y/o male with known seizure disorder presented to the [facility] ED on 12/31 with seizures and septic shock presumed from a UTI.  He was found to be hypotensive despite 5L NS so PCCM consulted for admission.    LINES / TUBES: 12/31 R IJ CVL>> Suprapubic cath>>  CULTURES: 12/31 blood x2 >> 12/31 urine >> Proteus  12/31 flu >> negative  ANTIBIOTICS: 12/31 vanc >> 12/31 zosyn >> 12/31 levaquin >> 12/31 cefepime x1 12/31 levaquin x1  SIGNIFICANT EVENTS:  12/31  Admitted with septic shock in setting of UTI and encephalopathy / seizures  LEVEL OF CARE:  Floor  PRIMARY SERVICE:  PCCM CONSULTANTS:   CODE STATUS: Full DIET:  NPO DVT Px: AKA /Coagulopathy GI Px:  Pepcid  INTERVAL HISTORY:  Transferred to floor 1/2  SLP w/ puree thin liquid diet   VITAL SIGNS: Temp:  [98 F (36.7 C)-99 F (37.2 C)] 98 F (36.7 C) (01/03 4098) Pulse Rate:  [77-96] 91  (01/03 0638) Resp:  [15-20] 16  (01/03 1191) BP: (96-127)/(55-92) 127/69 mmHg (01/03 0638) SpO2:  [96 %-99 %] 99 % (01/03 0638) Weight:  [65.4 kg (144 lb 2.9 oz)-65.59 kg (144 lb 9.6 oz)] 65.59 kg (144 lb 9.6 oz) (01/03 0411)  HEMODYNAMICS:    INTAKE / OUTPUT: Intake/Output      01/02 0701 - 01/03 0700 01/03 0701 - 01/04 0700   I.V. (mL/kg) 1800 (27.4)    Other 400    IV Piggyback 215.5    Total Intake(mL/kg) 2415.5 (36.8)    Urine (mL/kg/hr) 3875 (2.5)    Total Output 3875    Net -1459.5         Stool Occurrence 2 x      PHYSICAL EXAMINATION: General:  No acute distress Neuro:  Awake, dysarthric, repeats words, poorly oriented, moves all ext. Left vp  Shunt noted HEENT:  PERRL Cardiovascular:  RRR, no murmurs Lungs:  resps even non labored on [place], Scattered rhonchi Abdomen:  Soft, nontender, chronic suprapubic  catheter Musculoskeletal:  Bilateral AKA Skin:  Intact   LABS:  Lab 07/04/12 0455 07/03/12 0400 07/02/12 2030 07/02/12 0500 07/01/12 2209 07/01/12 1530 07/01/12 1040 07/01/12 0728 07/01/12 0555 07/01/12 0535 06/30/12 2335  HGB 10.4* 11.0* -- 11.3* -- -- -- -- -- 11.6* 15.4  WBC 6.8 9.4 -- 13.2* -- -- -- -- -- 10.9* 12.7*  PLT 125* 119* -- 124* -- -- -- -- -- -- --  NA 141 143 143 141 -- -- -- -- -- 141 --  K 2.9* 2.7* -- -- -- -- -- -- -- -- --  CL 107 109 110 111 -- -- -- -- -- 115* --  CO2 28 25 25 22  -- -- -- -- -- 19 --  GLUCOSE 91 95 104* 113* -- -- -- -- -- 142* --  BUN <3* 3* 4* 6 -- -- -- -- -- 21 --  CREATININE 0.56 0.58 0.57 0.60 -- -- -- -- -- 0.84 --  CALCIUM 8.1* 8.0* 7.9* 7.4* -- -- -- -- -- 7.1* --  MG -- -- 1.3* -- -- -- -- -- -- -- --  PHOS -- -- -- -- -- -- -- -- -- -- --  AST 16 20 22 28  -- -- -- -- -- 73* --  ALT  34 48 54* 67* -- -- -- -- -- 116* --  ALKPHOS 43 46 47 46 -- -- -- -- -- 50 --  BILITOT 0.5 0.6 0.6 0.5 -- -- -- -- -- 0.5 --  PROT 5.4* 5.4* 5.6* 5.5* -- -- -- -- -- 5.6* --  ALBUMIN 2.3* 2.5* 2.5* 2.5* -- -- -- -- -- 2.6* --  INR 1.46 -- -- -- -- -- -- -- -- 1.68* --  APTT -- -- -- -- -- -- -- -- -- 38* --  INR 1.46 -- -- -- -- -- -- -- -- 1.68* --  LATICACIDVEN -- -- -- -- 0.7 0.9 -- 1.4 1.3 -- 1.4  TROPONINI -- -- -- -- <0.30 <0.30 -- 0.37* -- 0.38* --  PHART -- -- -- -- -- -- -- -- -- -- --  PCO2ART -- -- -- -- -- -- -- -- -- -- --  PO2ART -- -- -- -- -- -- -- -- -- -- --  HCO3 -- -- -- -- -- -- -- -- -- -- --  O2SAT -- -- -- -- -- -- 83.2 -- -- -- --   IMAGING: 1/1  PCXR >> no change bilateral basilar airspace disease 12/31  Abdomen US >> Suspected hepatitis, cholelithiasis, suspected medical renal disease 12/31 Vent shunt stable, no changes or acute findings   ECG: 12/31  12 lead ECG >>> Sinus tachycardia, 1st degree AV block, diffuse ST-T changes  DIAGNOSES: Active Problems:  CONVULSIONS, SEIZURES, NOS  UTI (lower urinary tract  infection)  HCAP (healthcare-associated pneumonia)  Septic shock  Acute encephalopathy  Coagulopathy  Hypokalemia  Hyperglycemia  ASSESSMENT / PLAN:  PULMONARY  A:  Possible HCAP. P:   Goal SpO2 > 92 Supplemental oxygen PRN Follow CXR Abx as per ID section  CARDIOVASCULAR  A: Septic shock in setting of UTI / chronic suprapubic catheter, possible HCAP and severe dehydration.>resolved  P:  Off pressors   RENAL  A:  Severe dehydration. Hypokalemia  P:    Replete K  F/u chem   GASTROINTESTINAL  A:  Sonographic evidence of hepatitis.  Elevated transaminases >> improving.  Acute hepatitis serology neg>LFT return to nml  Cholelithiasis without evidence of cholecystitis. Malnutrition, Passed bedside swallow eval > diet P:   Modified diet-puree     HEMATOLOGIC  A:  Anemia.  Thrombocytopenia.  Coagulopathy. P:  Trend CBC Trend APTT / INR  INFECTIOUS  A:  UTI - proteus - sens pending.   Possible HCAP. Pct equivical (2.05) P:   levaquin + vanco + zosyn Narrow as able when culture data available   ENDOCRINE  A:  DM.  Hyperglycemia. P:   SSI  NEUROLOGIC  A:  Acute encephalopathy.  Unclear baseline MS, but suspect he is normalizing Seizures. Hx of Left VP shunt. CT head stable 12/31 P:   -Cont keppra/dilantin  -need to speak with son about pt's baseline, whether he is normalized. (NH pt )   Transfer to [facility]  PCCM will sign off. Please call if we can be of further assistance   PARRETT,TAMMY, NP 07/04/2012  12:00 PM Pager: [phone] or [phone]  *Care during the described time interval was provided by me and/or other providers on the critical care team. I have reviewed this patient's available data, including medical history, events of note, physical examination and test results as part of my evaluation.   Billy Fischer, MD ; [facility] [phone].  After 5:30 PM or weekends, call [phone]

## 2012-07-04 NOTE — Progress Notes (Signed)
Speech Language Pathology Dysphagia Treatment Patient Details Name: [name] MRN: [id number] DOB: 05/06/1946 Today's Date: 07/04/2012 Time: 4098-1191 SLP Time Calculation (min): 14 min  Assessment / Plan / Recommendation Clinical Impression  Pt seen to assess tolerance of po diet and for dx/tx.  Pt voice clear but speech is dysarthric and nearly impossible to comprehend.  He declined all po intake offered by SLP including icecream, applesauce and apple juice.  SLP educated pt to diet consistency and precautions but with cognitive deficit uncertain how much he understands.  Intake has been poor - approx 10-20%.  Nurse tech reports that have been "trying to get pt to eat today."  SLP to return Monday for education and further diagnostic treatment.      Diet Recommendation  Initiate / Change Diet: Dysphagia 1 (puree);Thin liquid    SLP Plan Continue with current plan of care   Pertinent Vitals/Pain Afebrile, decreased-rhonchi   Swallowing Goals  SLP Swallowing Goals Swallow Study Goal #2 - Progress: Progressing toward goal  General Respiratory Status: Supplemental O2 delivered via (comment) Behavior/Cognition: Confused;Agitated;Impulsive;Uncooperative;Decreased sustained attention Oral Cavity - Dentition: Edentulous Patient Positioning: Upright in bed  Oral Cavity - Oral Hygiene   pt did not open mouth to allow view  Dysphagia Treatment Treatment focused on: Patient/family/caregiver education Family/Caregiver Educated: pt Patient observed directly with PO's: No Reason PO's not observed: Refused Feeding: Total assist Type of cueing: Verbal Amount of cueing: Total   GO     Donavan Burnet, MS [facility] SLP [phone]

## 2012-07-04 NOTE — Progress Notes (Signed)
Clinical Social Work Department BRIEF PSYCHOSOCIAL ASSESSMENT 07/04/2012  Patient:  [name], [name]     Account Number:  [id number]     Admit date:  06/30/2012  Clinical Social Worker:  Orpah Greek  Date/Time:  07/04/2012 01:05 PM  Referred by:  Physician  Date Referred:  07/04/2012 Referred for  SNF Placement   Other Referral:   Interview type:  Other - See comment Other interview type:   Guardian    PSYCHOSOCIAL DATA Living Status:  FACILITY Admitted from facility:  [facility] Level of care:  Skilled Nursing Facility Primary support name:  Michiel Sites (Guardian) w#: [phone] c#: [phone] Primary support relationship to patient:   Degree of support available:   good    CURRENT CONCERNS Current Concerns  Post-Acute Placement   Other Concerns:    SOCIAL WORK ASSESSMENT / PLAN CSW spoke with patient's guardian, Noreene Larsson re: discharge planning. Patient was admitted from [facility] SNF where she plans to return when discharged.   Assessment/plan status:  Information/Referral to [facility] Other assessment/ plan:   Information/referral to community resources:   CSW completed FL2 and faxed information to [facility]. Confirmed with Roxanne @ SNF that patient is ok to return when ready.    PATIENT'S/FAMILY'S RESPONSE TO PLAN OF CARE: Patient's guardian states that patient is to return to SNF at discharge. CSW left message for patient's sister, Talbert Forest.        Unice Bailey, LCSW [facility] Clinical Social Worker cell #: [phone]

## 2012-07-05 ENCOUNTER — Inpatient Hospital Stay (HOSPITAL_COMMUNITY): Payer: Medicare Other

## 2012-07-05 DIAGNOSIS — I10 Essential (primary) hypertension: Secondary | ICD-10-CM

## 2012-07-05 LAB — BASIC METABOLIC PANEL
CO2: 28 mEq/L (ref 19–32)
Calcium: 8.1 mg/dL — ABNORMAL LOW (ref 8.4–10.5)
Chloride: 106 mEq/L (ref 96–112)
Glucose, Bld: 89 mg/dL (ref 70–99)
Sodium: 143 mEq/L (ref 135–145)

## 2012-07-05 LAB — CBC
Hemoglobin: 10.4 g/dL — ABNORMAL LOW (ref 13.0–17.0)
MCH: 30.2 pg (ref 26.0–34.0)
Platelets: 133 10*3/uL — ABNORMAL LOW (ref 150–400)
RBC: 3.44 MIL/uL — ABNORMAL LOW (ref 4.22–5.81)
WBC: 6.6 10*3/uL (ref 4.0–10.5)

## 2012-07-05 LAB — PHENYTOIN LEVEL, FREE AND TOTAL: Phenytoin, Free: <0.5 mg/L (ref 1.0–2.0)

## 2012-07-05 LAB — GLUCOSE, CAPILLARY

## 2012-07-05 LAB — MAGNESIUM: Magnesium: 1.5 mg/dL (ref 1.5–2.5)

## 2012-07-05 MED ORDER — MAGNESIUM SULFATE 40 MG/ML IJ SOLN
2.0000 g | Freq: Once | INTRAMUSCULAR | Status: AC
  Administered 2012-07-05: 2 g via INTRAVENOUS
  Filled 2012-07-05: qty 50

## 2012-07-05 MED ORDER — POTASSIUM CHLORIDE 10 MEQ/100ML IV SOLN
10.0000 meq | INTRAVENOUS | Status: AC
  Administered 2012-07-05 (×4): 10 meq via INTRAVENOUS
  Filled 2012-07-05 (×4): qty 100

## 2012-07-05 MED ORDER — POTASSIUM CHLORIDE 10 MEQ/100ML IV SOLN
10.0000 meq | INTRAVENOUS | Status: AC
  Administered 2012-07-05 (×3): 10 meq via INTRAVENOUS
  Filled 2012-07-05 (×3): qty 100

## 2012-07-05 MED ORDER — VITAMINS A & D EX OINT
TOPICAL_OINTMENT | CUTANEOUS | Status: AC
  Administered 2012-07-05: 09:00:00
  Filled 2012-07-05: qty 5

## 2012-07-05 MED ORDER — MAGNESIUM SULFATE BOLUS VIA INFUSION
2.0000 g | Freq: Once | INTRAVENOUS | Status: DC
  Filled 2012-07-05 (×3): qty 500

## 2012-07-05 NOTE — Progress Notes (Signed)
CRITICAL VALUE ALERT  Critical value received:  Potassium 2.7  Date of notification:  07/05/12  Time of notification:  05:44   Critical value read back:yes  Nurse who received alert:  Christell Faith, RN  MD notified (1st page):  Claiborne Billings [facility] Night floor coverage)  Time of first page:  05:45  MD notified (2nd page):  NA  Time of second page:  NA  Responding MD:  Claiborne Billings  Time MD responded:  05:49   * MD ordered 4 runs of K+ 1mEq/100mL to start at 0600

## 2012-07-05 NOTE — Progress Notes (Signed)
ANTIBIOTIC CONSULT NOTE - FOLLOW UP  Pharmacy Consult for Vancomycin, Zosyn Indication: r/o sepsis  Allergies  Allergen Reactions  . Heparin Other (See Comments)    Hx of HIT in 2007    Patient Measurements: Height: 3' 4.55" (103 cm) Weight: 147 lb 7.8 oz (66.9 kg) IBW/kg (Calculated) : 5.27  +++  Note bilateral AKA +++  Vital Signs: Temp: 98.5 F (36.9 C) (01/04 0410) Temp src: Axillary (01/04 0410) BP: 124/61 mmHg (01/04 0410) Pulse Rate: 92  (01/04 0410) Intake/Output from previous day: 01/03 0701 - 01/04 0700 In: 1930 [P.O.:180; I.V.:1200; IV Piggyback:550] Out: 2975 [Urine:2975]  Labs:  Basename 07/05/12 0440 07/04/12 0455 07/03/12 0400  WBC 6.6 6.8 9.4  HGB 10.4* 10.4* 11.0*  PLT 133* 125* 119*  LABCREA -- -- --  CREATININE 0.55 0.56 0.58   Estimated Creatinine Clearance: 39.5 ml/min (by C-G formula based on Cr of 0.55).   Microbiology: Recent Results (from the past 720 hour(s))  CULTURE, BLOOD (ROUTINE X 2)     Status: Normal (Preliminary result)   Collection Time   06/30/12 11:35 PM      Component Value Range Status Comment   Specimen Description BLOOD RIGHT HAND   Final    Special Requests BOTTLES DRAWN AEROBIC AND ANAEROBIC 5CC   Final    Culture  Setup Time 07/01/2012 05:07   Final    Culture     Final    Value:        BLOOD CULTURE RECEIVED NO GROWTH TO DATE CULTURE WILL BE HELD FOR 5 DAYS BEFORE ISSUING A FINAL NEGATIVE REPORT   Report Status PENDING   Incomplete   CULTURE, BLOOD (ROUTINE X 2)     Status: Normal (Preliminary result)   Collection Time   06/30/12 11:50 PM      Component Value Range Status Comment   Specimen Description BLOOD LEFT HAND   Final    Special Requests BOTTLES DRAWN AEROBIC AND ANAEROBIC 2CC   Final    Culture  Setup Time 07/01/2012 05:07   Final    Culture     Final    Value:        BLOOD CULTURE RECEIVED NO GROWTH TO DATE CULTURE WILL BE HELD FOR 5 DAYS BEFORE ISSUING A FINAL NEGATIVE REPORT   Report Status PENDING    Incomplete   URINE CULTURE     Status: Normal   Collection Time   07/01/12 12:15 AM      Component Value Range Status Comment   Specimen Description URINE, CATHETERIZED   Final    Special Requests NONE   Final    Culture  Setup Time 07/01/2012 07:34   Final    Colony Count >=100,000 COLONIES/ML   Final    Culture PROTEUS MIRABILIS   Final    Report Status 07/03/2012 FINAL   Final    Organism ID, Bacteria PROTEUS MIRABILIS   Final   URINE CULTURE     Status: Normal (Preliminary result)   Collection Time   07/01/12  5:45 AM      Component Value Range Status Comment   Specimen Description URINE, CATHETERIZED   Final    Special Requests NONE   Final    Culture  Setup Time 07/01/2012 09:04   Final    Colony Count 40,000 COLONIES/ML   Final    Culture PROTEUS MIRABILIS   Final    Report Status PENDING   Incomplete   MRSA PCR SCREENING  Status: Abnormal   Collection Time   07/01/12 11:03 AM      Component Value Range Status Comment   MRSA by PCR POSITIVE (*) NEGATIVE Final     Anti-infectives     Start     Dose/Rate Route Frequency Ordered Stop   07/03/12 2200   levofloxacin (LEVAQUIN) IVPB 750 mg        750 mg 100 mL/hr over 90 Minutes Intravenous Every 48 hours 07/02/12 0920 07/03/12 2314   07/03/12 0600   vancomycin (VANCOCIN) IVPB 1000 mg/200 mL premix        1,000 mg 200 mL/hr over 60 Minutes Intravenous Every 24 hours 07/02/12 0920     07/01/12 1800   vancomycin (VANCOCIN) 750 mg in sodium chloride 0.9 % 150 mL IVPB  Status:  Discontinued        750 mg 150 mL/hr over 60 Minutes Intravenous Every 12 hours 07/01/12 0659 07/02/12 0920   07/01/12 1400   piperacillin-tazobactam (ZOSYN) IVPB 3.375 g        3.375 g 12.5 mL/hr over 240 Minutes Intravenous 3 times per day 07/01/12 0659     07/01/12 0530   piperacillin-tazobactam (ZOSYN) IVPB 3.375 g        3.375 g 100 mL/hr over 30 Minutes Intravenous  Once 07/01/12 0524 07/01/12 0647   07/01/12 0530   vancomycin  (VANCOCIN) IVPB 1000 mg/200 mL premix  Status:  Discontinued        1,000 mg 200 mL/hr over 60 Minutes Intravenous  Once 07/01/12 0524 07/01/12 0530   07/01/12 0015   ceFEPIme (MAXIPIME) 1 g in dextrose 5 % 50 mL IVPB  Status:  Discontinued        1 g 100 mL/hr over 30 Minutes Intravenous 3 times per day 07/01/12 0006 07/01/12 0549   07/01/12 0015   levofloxacin (LEVAQUIN) IVPB 750 mg  Status:  Discontinued        750 mg 100 mL/hr over 90 Minutes Intravenous Every 24 hours 07/01/12 0006 07/02/12 0920   07/01/12 0015   vancomycin (VANCOCIN) IVPB 1000 mg/200 mL premix        1,000 mg 200 mL/hr over 60 Minutes Intravenous  Once 07/01/12 0009 07/01/12 0317          Assessment:  66 YOM from nursing facility to ED on 12/31 w/ reports of seizures (known hx of seizures) and AMS, found to be in septic shock  Day #5 Vancomycin, Zosyn (levaquin 12/31-1/2)  SCr wnl and stable. WBC wnl. Afebrile  Goal of Therapy:  Vancomycin trough level 15-20 mcg/ml Appropriate abx dosing, eradication of infection.  Plan:   Continue Zosyn 3.375g IV Q8H infused over 4hrs and Vancomycin 1g IV q24h.  Duration of tx per MD  Will consider VT if Vanc expected to continue  Gwen Her PharmD  [phone] 07/05/2012 11:27 AM

## 2012-07-05 NOTE — Progress Notes (Signed)
Subjective: Patient seen and examined, admitted with septic shock from UTI. Patient was hypotensive and he was admitted to ICU and required pressors. He was taken off pressors and BP has been stable, the urine culture grew proteus and currently he is on vancomycin, zosyn. The final sensitivities are pending.  Objective: Vital signs in last 24 hours: Temp:  [98.3 F (36.8 C)-98.5 F (36.9 C)] 98.5 F (36.9 C) (01/04 0410) Pulse Rate:  [92-98] 92  (01/04 0410) Resp:  [16-18] 18  (01/04 0410) BP: (90-130)/(53-86) 124/61 mmHg (01/04 0410) SpO2:  [98 %] 98 % (01/04 0410) Weight:  [66.9 kg (147 lb 7.8 oz)] 66.9 kg (147 lb 7.8 oz) (01/04 0410) Weight change: 1.5 kg (3 lb 4.9 oz) Last BM Date: 07/03/12  Consults: PCCM Antibiotics  Vancomycin 12/31>>> Zosyn 12/31>>>  Procedures:   Intake/Output from previous day: 01/03 0701 - 01/04 0700 In: 1930 [P.O.:180; I.V.:1200; IV Piggyback:550] Out: 2975 [Urine:2975] Total I/O In: 2081.7 [P.O.:120; I.V.:1661.7; IV Piggyback:300] Out: 1000 [Urine:1000]   Physical Exam: Head: Normocephalic, atraumatic.  Eyes: No signs of jaundice, EOMI Nose: Mucous membranes dry.  Neck: supple,No deformities, masses, or tenderness noted. Lungs: Normal respiratory effort. B/L Clear to auscultation, no crackles or wheezes.  Heart: Regular RR. S1 and S2 normal  Abdomen: BS normoactive. Soft, Nondistended, non-tender.  Extremities: Bilateral AKA Neuro: alert, communicating, dysarthria   Lab Results: Basic Metabolic Panel:  Basename 07/05/12 0440 07/04/12 0455 07/02/12 2030  NA 143 141 --  K 2.7* 2.9* --  CL 106 107 --  CO2 28 28 --  GLUCOSE 89 91 --  BUN <3* <3* --  CREATININE 0.55 0.56 --  CALCIUM 8.1* 8.1* --  MG 1.5 -- 1.3*  PHOS -- -- --   Liver Function Tests:  Basename 07/04/12 0455 07/03/12 0400  AST 16 20  ALT 34 48  ALKPHOS 43 46  BILITOT 0.5 0.6  PROT 5.4* 5.4*  ALBUMIN 2.3* 2.5*   No results found for this basename:  LIPASE:2,AMYLASE:2 in the last 72 hours No results found for this basename: AMMONIA:2 in the last 72 hours CBC:  Basename 07/05/12 0440 07/04/12 0455  WBC 6.6 6.8  NEUTROABS -- --  HGB 10.4* 10.4*  HCT 29.5* 29.5*  MCV 85.8 85.3  PLT 133* 125*   CBG:  Basename 07/05/12 0727 07/04/12 2112 07/04/12 1709 07/04/12 1152 07/04/12 0743 07/04/12 0410  GLUCAP 126* 95 88 132* 111* 88   Coagulation:  Basename 07/04/12 0455  LABPROT 17.3*  INR 1.46   Urine Drug Screen:  Recent Results (from the past 240 hour(s))  CULTURE, BLOOD (ROUTINE X 2)     Status: Normal (Preliminary result)   Collection Time   06/30/12 11:35 PM      Component Value Range Status Comment   Specimen Description BLOOD RIGHT HAND   Final    Special Requests BOTTLES DRAWN AEROBIC AND ANAEROBIC 5CC   Final    Culture  Setup Time 07/01/2012 05:07   Final    Culture     Final    Value:        BLOOD CULTURE RECEIVED NO GROWTH TO DATE CULTURE WILL BE HELD FOR 5 DAYS BEFORE ISSUING A FINAL NEGATIVE REPORT   Report Status PENDING   Incomplete   CULTURE, BLOOD (ROUTINE X 2)     Status: Normal (Preliminary result)   Collection Time   06/30/12 11:50 PM      Component Value Range Status Comment   Specimen Description BLOOD LEFT HAND  Final    Special Requests BOTTLES DRAWN AEROBIC AND ANAEROBIC 2CC   Final    Culture  Setup Time 07/01/2012 05:07   Final    Culture     Final    Value:        BLOOD CULTURE RECEIVED NO GROWTH TO DATE CULTURE WILL BE HELD FOR 5 DAYS BEFORE ISSUING A FINAL NEGATIVE REPORT   Report Status PENDING   Incomplete   URINE CULTURE     Status: Normal   Collection Time   07/01/12 12:15 AM      Component Value Range Status Comment   Specimen Description URINE, CATHETERIZED   Final    Special Requests NONE   Final    Culture  Setup Time 07/01/2012 07:34   Final    Colony Count >=100,000 COLONIES/ML   Final    Culture PROTEUS MIRABILIS   Final    Report Status 07/03/2012 FINAL   Final    Organism  ID, Bacteria PROTEUS MIRABILIS   Final   URINE CULTURE     Status: Normal (Preliminary result)   Collection Time   07/01/12  5:45 AM      Component Value Range Status Comment   Specimen Description URINE, CATHETERIZED   Final    Special Requests NONE   Final    Culture  Setup Time 07/01/2012 09:04   Final    Colony Count 40,000 COLONIES/ML   Final    Culture PROTEUS MIRABILIS   Final    Report Status PENDING   Incomplete   MRSA PCR SCREENING     Status: Abnormal   Collection Time   07/01/12 11:03 AM      Component Value Range Status Comment   MRSA by PCR POSITIVE (*) NEGATIVE Final     Studies/Results: Dg Chest Port 1 View  07/05/2012  *RADIOLOGY REPORT*  Clinical Data: Pneumonia.  Septic shock.  Urinary tract infection.  PORTABLE CHEST - 1 VIEW  Comparison: 07/02/2012  Findings: Increased opacity is seen in the left lower lobe silhouetting the left hemidiaphragm.  Mild right lower lung opacity shows no significant change.  Heart size is stable.  No definite pleural effusion seen.  Right jugular center venous catheter remains in appropriate position and VP shunt tubing is again seen in the left chest wall.  IMPRESSION:  1.  Increased left lower lobe infiltrate versus atelectasis. 2.  Stable mild right lower lung opacity.   Original Report Authenticated By: Myles Rosenthal, M.D.     Medications: Scheduled Meds:   . Chlorhexidine Gluconate Cloth  6 each Topical Q0600  . famotidine  20 mg Oral BID  . insulin aspart  0-9 Units Subcutaneous TID WC  . levETIRAcetam  300 mg Oral BID  . mupirocin ointment  1 application Nasal BID  . phenytoin  100 mg Oral BID  . piperacillin-tazobactam (ZOSYN)  IV  3.375 g Intravenous Q8H  . potassium chloride  10 mEq Intravenous Q1 Hr x 4  . vancomycin  1,000 mg Intravenous Q24H   Continuous Infusions:   . sodium chloride 100 mL/hr at 07/05/12 0117   PRN Meds:.antiseptic oral rinse, sodium chloride  Assessment/Plan:  Principal Problem:  *Septic  shock Active Problems:  HYPERTENSION, BENIGN SYSTEMIC  CEREBROVAS DIS, LATE EFFECTS (S/P CVA)  CONVULSIONS, SEIZURES, NOS  UTI (lower urinary tract infection)  HCAP (healthcare-associated pneumonia)  Acute encephalopathy  Coagulopathy  Hypokalemia  Hyperglycemia  Proteus septicemia  Septic shock Secondary to UTI Resolved  UTI Urine culture growing  Proteus mirabilis Sensitivities are pending at this time Continue Zosyn and vancomycin  Altered mental status H/o seizures, left VP shunt Probably back to his baseline Called and d/w son, he will come on monday  Possible healthcare associated pneumonia Continue IV antibiotics Vancomycin and Zosyn  Hypokalemia Percussion not replaced despite giving IV KCl Magnesium 1.5 The first replace magnesium by giving 2 g of mag sulfate IV x1 10 give KCl 10 mEq IV x3 Recheck magnesium and potassium in the morning     LOS: 5 days   [facility] Pager: [phone] 07/05/2012, 11:15 AM

## 2012-07-05 NOTE — Progress Notes (Signed)
MEDICATION RELATED CONSULT NOTE - follow up  Pharmacy Consult for Phenytoin Indication: Hx of sz, on phenytoin PTA  Allergies  Allergen Reactions  . Heparin Other (See Comments)    Hx of HIT in 2007    Patient Measurements:  Height: 3' 4.55" (103 cm) Weight: 147 lb 7.8 oz (66.9 kg) IBW/kg (Calculated) : 5.27   Vital Signs: Temp: 98.5 F (36.9 C) (01/04 0410) Temp src: Axillary (01/04 0410) BP: 124/61 mmHg (01/04 0410) Pulse Rate: 92  (01/04 0410)  Labs:  Basename 07/05/12 0440 07/04/12 0455 07/03/12 0400 07/02/12 2030  WBC 6.6 6.8 9.4 --  HGB 10.4* 10.4* 11.0* --  HCT 29.5* 29.5* 30.4* --  PLT 133* 125* 119* --  APTT -- -- -- --  CREATININE 0.55 0.56 0.58 --  LABCREA -- -- -- --  CREATININE 0.55 0.56 0.58 --  CREAT24HRUR -- -- -- --  MG 1.5 -- -- 1.3*  PHOS -- -- -- --  ALBUMIN -- 2.3* 2.5* 2.5*  PROT -- 5.4* 5.4* 5.6*  ALBUMIN -- 2.3* 2.5* 2.5*  AST -- 16 20 22   ALT -- 34 48 54*  ALKPHOS -- 43 46 47  BILITOT -- 0.5 0.6 0.6  BILIDIR -- -- -- --  IBILI -- -- -- --   Estimated Creatinine Clearance: 39.5 ml/min (by C-G formula based on Cr of 0.55).  Medications:  Scheduled:     . Chlorhexidine Gluconate Cloth  6 each Topical Q0600  . famotidine  20 mg Oral BID  . insulin aspart  0-9 Units Subcutaneous TID WC  . levETIRAcetam  300 mg Oral BID  . mupirocin ointment  1 application Nasal BID  . phenytoin  100 mg Oral BID  . piperacillin-tazobactam (ZOSYN)  IV  3.375 g Intravenous Q8H  . [COMPLETED] potassium chloride  10 mEq Intravenous Q1 Hr x 4  . potassium chloride  10 mEq Intravenous Q1 Hr x 4  . vancomycin  1,000 mg Intravenous Q24H  . [COMPLETED] vitamin A & D      . [DISCONTINUED] famotidine (PEPCID) IV  20 mg Intravenous Q12H  . [DISCONTINUED] insulin aspart  2-6 Units Subcutaneous Q4H  . [DISCONTINUED] levetiracetam  300 mg Intravenous Q12H  . [DISCONTINUED] phenytoin (DILANTIN) IV  100 mg Intravenous Q8H    Assessment:  82 YOM w/hx sz d/o  admitted from nursing facility 12/31 w/ seizures   PTA phenytoin was reportedly 100mg  PO BID and Keppra 300mg  PO BID PTA  Given Loading dose of PHT in ER (1000mg  IV x 1) @ 1730 12/31  PHT 100mg  IV q8h started 12/31 @ 2200  Phenytoin total level 1/1 = 12.8 (corrected for low albumin = 21). PHT level this AM = 15.1, corrected = 27  MD changed PHT to home dose of 100mg  PO BID yesterday  Although level is elevated today, suspect decrease back to home dose will achieve tx levels. Also, level may be elevated still from loading dose and from starting MD only 4 hours after LD.  Goal of Therapy:  Therapeutic phenytoin level (Free phenytoin: 1-2.5 mcg/mL, Total phenytoin: 10-20 mcg/ml)  Plan:   Continue home dose of 100mg  PO BID  Recommend recheck level 5-7 days to ensure tx levels at this dose  Recommend monitor for sx PHT toxicity (Nystagmus, blurred vision, diplopia, lethargy, dizziness)  Gwen Her PharmD  [phone] 07/05/2012 11:12 AM

## 2012-07-06 LAB — URINE CULTURE: Colony Count: 40000

## 2012-07-06 LAB — GLUCOSE, CAPILLARY
Glucose-Capillary: 101 mg/dL — ABNORMAL HIGH (ref 70–99)
Glucose-Capillary: 78 mg/dL (ref 70–99)

## 2012-07-06 LAB — BASIC METABOLIC PANEL
Calcium: 8.7 mg/dL (ref 8.4–10.5)
Creatinine, Ser: 0.57 mg/dL (ref 0.50–1.35)
GFR calc Af Amer: >90 mL/min (ref >90)
GFR calc non Af Amer: >90 mL/min (ref >90)
Sodium: 141 mEq/L (ref 135–145)

## 2012-07-06 LAB — MAGNESIUM: Magnesium: 1.7 mg/dL (ref 1.5–2.5)

## 2012-07-06 MED ORDER — DEXTROSE 5 % IV SOLN
1.0000 g | INTRAVENOUS | Status: DC
  Administered 2012-07-06 – 2012-07-08 (×3): 1 g via INTRAVENOUS
  Filled 2012-07-06 (×3): qty 1

## 2012-07-06 MED ORDER — POTASSIUM CHLORIDE 10 MEQ/100ML IV SOLN
10.0000 meq | INTRAVENOUS | Status: AC
  Administered 2012-07-06 (×3): 10 meq via INTRAVENOUS
  Filled 2012-07-06 (×3): qty 100

## 2012-07-06 NOTE — Progress Notes (Signed)
Subjective: Patient seen and examined, admitted with septic shock from UTI. Patient was hypotensive and he was admitted to ICU and required pressors. He was taken off pressors and BP has been stable, the urine culture grew proteus and currently he is on vancomycin, zosyn. The final sensitivities are pending.  Objective: Vital signs in last 24 hours: Temp:  [98.1 F (36.7 C)-99.6 F (37.6 C)] 98.1 F (36.7 C) (01/05 0438) Pulse Rate:  [95-99] 95  (01/05 0438) Resp:  [20] 20  (01/05 0438) BP: (114-129)/(63-91) 120/65 mmHg (01/05 0438) SpO2:  [98 %] 98 % (01/05 0438) Weight:  [64.7 kg (142 lb 10.2 oz)] 64.7 kg (142 lb 10.2 oz) (01/05 0438) Weight change: -2.2 kg (-4 lb 13.6 oz) Last BM Date: 07/06/12  Consults: PCCM Antibiotics  Vancomycin 12/31>>> Zosyn 12/31>>>1/5  Procedures:   Intake/Output from previous day: 01/04 0701 - 01/05 0700 In: 4670.8 [P.O.:360; I.V.:3160.8; IV Piggyback:1150] Out: 3451 [Urine:3450; Stool:1] Total I/O In: 30 [I.V.:30] Out: -    Physical Exam: Head: Normocephalic, atraumatic.  Eyes: No signs of jaundice, EOMI Nose: Mucous membranes dry.  Neck: supple,No deformities, masses, or tenderness noted. Lungs: Normal respiratory effort. B/L Clear to auscultation, no crackles or wheezes.  Heart: Regular RR. S1 and S2 normal  Abdomen: BS normoactive. Soft, Nondistended, non-tender.  Extremities: Bilateral AKA Neuro: alert, communicating, dysarthria   Lab Results: Basic Metabolic Panel:  Basename 07/06/12 1020 07/05/12 0440  NA 141 143  K 3.1* 2.7*  CL 104 106  CO2 28 28  GLUCOSE 89 89  BUN 3* <3*  CREATININE 0.57 0.55  CALCIUM 8.7 8.1*  MG 1.7 1.5  PHOS -- --   Liver Function Tests:  Basename 07/04/12 0455  AST 16  ALT 34  ALKPHOS 43  BILITOT 0.5  PROT 5.4*  ALBUMIN 2.3*   No results found for this basename: LIPASE:2,AMYLASE:2 in the last 72 hours No results found for this basename: AMMONIA:2 in the last 72 hours CBC:  Basename  07/05/12 0440 07/04/12 0455  WBC 6.6 6.8  NEUTROABS -- --  HGB 10.4* 10.4*  HCT 29.5* 29.5*  MCV 85.8 85.3  PLT 133* 125*   CBG:  Basename 07/06/12 1219 07/06/12 0809 07/05/12 2044 07/05/12 1658 07/05/12 1121 07/05/12 0727  GLUCAP 78 101* 107* 93 106* 126*   Coagulation:  Basename 07/04/12 0455  LABPROT 17.3*  INR 1.46   Urine Drug Screen:  Recent Results (from the past 240 hour(s))  CULTURE, BLOOD (ROUTINE X 2)     Status: Normal (Preliminary result)   Collection Time   06/30/12 11:35 PM      Component Value Range Status Comment   Specimen Description BLOOD RIGHT HAND   Final    Special Requests BOTTLES DRAWN AEROBIC AND ANAEROBIC 5CC   Final    Culture  Setup Time 07/01/2012 05:07   Final    Culture     Final    Value:        BLOOD CULTURE RECEIVED NO GROWTH TO DATE CULTURE WILL BE HELD FOR 5 DAYS BEFORE ISSUING A FINAL NEGATIVE REPORT   Report Status PENDING   Incomplete   CULTURE, BLOOD (ROUTINE X 2)     Status: Normal (Preliminary result)   Collection Time   06/30/12 11:50 PM      Component Value Range Status Comment   Specimen Description BLOOD LEFT HAND   Final    Special Requests BOTTLES DRAWN AEROBIC AND ANAEROBIC 2CC   Final    Culture  Setup Time 07/01/2012 05:07   Final    Culture     Final    Value:        BLOOD CULTURE RECEIVED NO GROWTH TO DATE CULTURE WILL BE HELD FOR 5 DAYS BEFORE ISSUING A FINAL NEGATIVE REPORT   Report Status PENDING   Incomplete   URINE CULTURE     Status: Normal   Collection Time   07/01/12 12:15 AM      Component Value Range Status Comment   Specimen Description URINE, CATHETERIZED   Final    Special Requests NONE   Final    Culture  Setup Time 07/01/2012 07:34   Final    Colony Count >=100,000 COLONIES/ML   Final    Culture PROTEUS MIRABILIS   Final    Report Status 07/03/2012 FINAL   Final    Organism ID, Bacteria PROTEUS MIRABILIS   Final   URINE CULTURE     Status: Normal   Collection Time   07/01/12  5:45 AM       Component Value Range Status Comment   Specimen Description URINE, CATHETERIZED   Final    Special Requests NONE   Final    Culture  Setup Time 07/01/2012 09:04   Final    Colony Count 40,000 COLONIES/ML   Final    Culture     Final    Value: PROTEUS MIRABILIS     ENTEROCOCCUS SPECIES     PSEUDOMONAS AERUGINOSA   Report Status 07/06/2012 FINAL   Final    Organism ID, Bacteria PROTEUS MIRABILIS   Final    Organism ID, Bacteria ENTEROCOCCUS SPECIES   Final    Organism ID, Bacteria PSEUDOMONAS AERUGINOSA   Final   MRSA PCR SCREENING     Status: Abnormal   Collection Time   07/01/12 11:03 AM      Component Value Range Status Comment   MRSA by PCR POSITIVE (*) NEGATIVE Final     Studies/Results: Dg Chest Port 1 View  07/05/2012  *RADIOLOGY REPORT*  Clinical Data: Pneumonia.  Septic shock.  Urinary tract infection.  PORTABLE CHEST - 1 VIEW  Comparison: 07/02/2012  Findings: Increased opacity is seen in the left lower lobe silhouetting the left hemidiaphragm.  Mild right lower lung opacity shows no significant change.  Heart size is stable.  No definite pleural effusion seen.  Right jugular center venous catheter remains in appropriate position and VP shunt tubing is again seen in the left chest wall.  IMPRESSION:  1.  Increased left lower lobe infiltrate versus atelectasis. 2.  Stable mild right lower lung opacity.   Original Report Authenticated By: Myles Rosenthal, M.D.     Medications: Scheduled Meds:    . famotidine  20 mg Oral BID  . insulin aspart  0-9 Units Subcutaneous TID WC  . levETIRAcetam  300 mg Oral BID  . phenytoin  100 mg Oral BID  . piperacillin-tazobactam (ZOSYN)  IV  3.375 g Intravenous Q8H  . vancomycin  1,000 mg Intravenous Q24H   Continuous Infusions:    . sodium chloride 75 mL/hr at 07/06/12 1117   PRN Meds:.antiseptic oral rinse, sodium chloride  Assessment/Plan:  Principal Problem:  *Septic shock Active Problems:  HYPERTENSION, BENIGN SYSTEMIC  CEREBROVAS  DIS, LATE EFFECTS (S/P CVA)  CONVULSIONS, SEIZURES, NOS  UTI (lower urinary tract infection)  HCAP (healthcare-associated pneumonia)  Acute encephalopathy  Coagulopathy  Hypokalemia  Hyperglycemia  Proteus septicemia  Septic shock Secondary to UTI Resolved  UTI Urine culture growing  Proteus mirabilis, pseudomonas, enterococcus Pseudomonas is resistant to zosyn Will start cefepime and continue vancomycin   Altered mental status H/o seizures, left VP shunt Probably back to his baseline Called and d/w son, he will come on monday  Possible healthcare associated pneumonia Continue IV antibiotics Vancomycin, cefepime  Hypokalemia K still low Will give IV Kcl 10 meq x 3    LOS: 6 days   [facility] Pager: [phone] 07/06/2012, 2:39 PM

## 2012-07-06 NOTE — Progress Notes (Signed)
Uanble to get pt to take meds yesterday and today, Dr Sharl Ma aware, will call pts family to see if they can help.

## 2012-07-06 NOTE — Progress Notes (Signed)
Spoke with pts son [name] about pt refusing to eat and take meds( per Dr Rebbeca Paul request). He states pt is a picky eater and this is not unusual. He usually will only eat peanut butter..States he will come tomorrow to see pt

## 2012-07-06 NOTE — Progress Notes (Signed)
ANTIBIOTIC CONSULT NOTE - INITIAL  Pharmacy Consult for Cefepime Indication: UTI  Allergies  Allergen Reactions  . Heparin Other (See Comments)    Hx of HIT in 2007    Patient Measurements: Height: 3' 4.55" (103 cm) Weight: 142 lb 10.2 oz (64.7 kg) IBW/kg (Calculated) : 5.27   Vital Signs: Temp: 98.1 F (36.7 C) (01/05 0438) Temp src: Axillary (01/05 0438) BP: 120/65 mmHg (01/05 0438) Pulse Rate: 95  (01/05 0438) Intake/Output from previous day: 01/04 0701 - 01/05 0700 In: 4670.8 [P.O.:360; I.V.:3160.8; IV Piggyback:1150] Out: 3451 [Urine:3450; Stool:1] Intake/Output from this shift: Total I/O In: 30 [I.V.:30] Out: -   Labs:  Basename 07/06/12 1020 07/05/12 0440 07/04/12 0455  WBC -- 6.6 6.8  HGB -- 10.4* 10.4*  PLT -- 133* 125*  LABCREA -- -- --  CREATININE 0.57 0.55 0.56   Estimated Creatinine Clearance: 38.3 ml/min (by C-G formula based on Cr of 0.57). No results found for this basename: VANCOTROUGH:2,VANCOPEAK:2,VANCORANDOM:2,GENTTROUGH:2,GENTPEAK:2,GENTRANDOM:2,TOBRATROUGH:2,TOBRAPEAK:2,TOBRARND:2,AMIKACINPEAK:2,AMIKACINTROU:2,AMIKACIN:2, in the last 72 hours   Microbiology: Recent Results (from the past 720 hour(s))  CULTURE, BLOOD (ROUTINE X 2)     Status: Normal (Preliminary result)   Collection Time   06/30/12 11:35 PM      Component Value Range Status Comment   Specimen Description BLOOD RIGHT HAND   Final    Special Requests BOTTLES DRAWN AEROBIC AND ANAEROBIC 5CC   Final    Culture  Setup Time 07/01/2012 05:07   Final    Culture     Final    Value:        BLOOD CULTURE RECEIVED NO GROWTH TO DATE CULTURE WILL BE HELD FOR 5 DAYS BEFORE ISSUING A FINAL NEGATIVE REPORT   Report Status PENDING   Incomplete   CULTURE, BLOOD (ROUTINE X 2)     Status: Normal (Preliminary result)   Collection Time   06/30/12 11:50 PM      Component Value Range Status Comment   Specimen Description BLOOD LEFT HAND   Final    Special Requests BOTTLES DRAWN AEROBIC AND  ANAEROBIC 2CC   Final    Culture  Setup Time 07/01/2012 05:07   Final    Culture     Final    Value:        BLOOD CULTURE RECEIVED NO GROWTH TO DATE CULTURE WILL BE HELD FOR 5 DAYS BEFORE ISSUING A FINAL NEGATIVE REPORT   Report Status PENDING   Incomplete   URINE CULTURE     Status: Normal   Collection Time   07/01/12 12:15 AM      Component Value Range Status Comment   Specimen Description URINE, CATHETERIZED   Final    Special Requests NONE   Final    Culture  Setup Time 07/01/2012 07:34   Final    Colony Count >=100,000 COLONIES/ML   Final    Culture PROTEUS MIRABILIS   Final    Report Status 07/03/2012 FINAL   Final    Organism ID, Bacteria PROTEUS MIRABILIS   Final   URINE CULTURE     Status: Normal   Collection Time   07/01/12  5:45 AM      Component Value Range Status Comment   Specimen Description URINE, CATHETERIZED   Final    Special Requests NONE   Final    Culture  Setup Time 07/01/2012 09:04   Final    Colony Count 40,000 COLONIES/ML   Final    Culture     Final    Value:  PROTEUS MIRABILIS     ENTEROCOCCUS SPECIES     PSEUDOMONAS AERUGINOSA   Report Status 07/06/2012 FINAL   Final    Organism ID, Bacteria PROTEUS MIRABILIS   Final    Organism ID, Bacteria ENTEROCOCCUS SPECIES   Final    Organism ID, Bacteria PSEUDOMONAS AERUGINOSA   Final   MRSA PCR SCREENING     Status: Abnormal   Collection Time   07/01/12 11:03 AM      Component Value Range Status Comment   MRSA by PCR POSITIVE (*) NEGATIVE Final     Medical History: Past Medical History  Diagnosis Date  . Diabetes mellitus   . Orchitis and epididymitis in disease classified elsewhere   . Unspecified intestinal obstruction   . Unspecified osteomyelitis, site unspecified   . Encounter for long-term (current) use of other medications   . Hypertrophy of prostate without urinary obstruction and other lower urinary tract symptoms (LUTS)   . Depressive disorder, not elsewhere classified   . Edema of penis     . Pulmonary congestion and hypostasis   . Other ascites   . Thrombocytopenia   . Urinary tract infection, site not specified   . Chronic ulcer of other specified site   . Acute, but ill-defined, cerebrovascular disease   . Intestinal infection due to E. coli, unspecified   . Other convulsions   . Unspecified essential hypertension   . Urinary tract infection, site not specified   . Acute upper respiratory infections of unspecified site   . Other chronic pain   . Unspecified psychosis   . Unspecified constipation   . Acute kidney failure, unspecified     Medications:  Anti-infectives     Start     Dose/Rate Route Frequency Ordered Stop   07/03/12 2200   levofloxacin (LEVAQUIN) IVPB 750 mg        750 mg 100 mL/hr over 90 Minutes Intravenous Every 48 hours 07/02/12 0920 07/03/12 2314   07/03/12 0600   vancomycin (VANCOCIN) IVPB 1000 mg/200 mL premix        1,000 mg 200 mL/hr over 60 Minutes Intravenous Every 24 hours 07/02/12 0920     07/01/12 1800   vancomycin (VANCOCIN) 750 mg in sodium chloride 0.9 % 150 mL IVPB  Status:  Discontinued        750 mg 150 mL/hr over 60 Minutes Intravenous Every 12 hours 07/01/12 0659 07/02/12 0920   07/01/12 1400   piperacillin-tazobactam (ZOSYN) IVPB 3.375 g  Status:  Discontinued        3.375 g 12.5 mL/hr over 240 Minutes Intravenous 3 times per day 07/01/12 0659 07/06/12 1443   07/01/12 0530  piperacillin-tazobactam (ZOSYN) IVPB 3.375 g       3.375 g 100 mL/hr over 30 Minutes Intravenous  Once 07/01/12 0524 07/01/12 0647   07/01/12 0530   vancomycin (VANCOCIN) IVPB 1000 mg/200 mL premix  Status:  Discontinued        1,000 mg 200 mL/hr over 60 Minutes Intravenous  Once 07/01/12 0524 07/01/12 0530   07/01/12 0015   ceFEPIme (MAXIPIME) 1 g in dextrose 5 % 50 mL IVPB  Status:  Discontinued        1 g 100 mL/hr over 30 Minutes Intravenous 3 times per day 07/01/12 0006 07/01/12 0549   07/01/12 0015   levofloxacin (LEVAQUIN) IVPB 750 mg   Status:  Discontinued        750 mg 100 mL/hr over 90 Minutes Intravenous Every 24 hours 07/01/12  0006 07/02/12 0920   07/01/12 0015   vancomycin (VANCOCIN) IVPB 1000 mg/200 mL premix        1,000 mg 200 mL/hr over 60 Minutes Intravenous  Once 07/01/12 0009 07/01/12 1191         Assessment: 67 yo M admitted on 12/31 from nursing facility with septic shock from UTI. Currently on Day #6 of IV Vanco and Zosyn. Urine now growing pseudomonas resistant to zosyn but sensitive to cefepime (in addition to proteus mirabilis and enterococcus spp). Plan now is to continue IV Vanco, but change Zosyn to cefepime. Will renally adjust for CrCL~17ml/min.  Plan:  1) Cefepime 1g IV q24h 2) Continue Vanco 1g IV q24h 3) Vanco trough tomorrow at 05:30am  Darrol Angel, PharmD Pager: [phone] 07/06/2012,2:46 PM

## 2012-07-07 LAB — VANCOMYCIN, TROUGH: Vancomycin Tr: 5.4 ug/mL — ABNORMAL LOW (ref 10.0–20.0)

## 2012-07-07 LAB — BASIC METABOLIC PANEL
BUN: <3 mg/dL — ABNORMAL LOW (ref 6–23)
CO2: 26 mEq/L (ref 19–32)
Calcium: 9.2 mg/dL (ref 8.4–10.5)
GFR calc non Af Amer: >90 mL/min (ref >90)
Glucose, Bld: 95 mg/dL (ref 70–99)
Sodium: 139 mEq/L (ref 135–145)

## 2012-07-07 LAB — GLUCOSE, CAPILLARY
Glucose-Capillary: 83 mg/dL (ref 70–99)
Glucose-Capillary: 107 mg/dL — ABNORMAL HIGH (ref 70–99)

## 2012-07-07 LAB — CULTURE, BLOOD (ROUTINE X 2): Culture: NO GROWTH

## 2012-07-07 MED ORDER — VANCOMYCIN HCL IN DEXTROSE 1-5 GM/200ML-% IV SOLN
1000.0000 mg | Freq: Two times a day (BID) | INTRAVENOUS | Status: DC
  Administered 2012-07-07 – 2012-07-08 (×3): 1000 mg via INTRAVENOUS
  Filled 2012-07-07 (×3): qty 200

## 2012-07-07 MED ORDER — SODIUM CHLORIDE 0.45 % IV SOLN: INTRAVENOUS | Status: DC

## 2012-07-07 MED ORDER — POTASSIUM CHLORIDE IN NACL 20-0.45 MEQ/L-% IV SOLN
INTRAVENOUS | Status: DC
  Administered 2012-07-07 – 2012-07-08 (×2): via INTRAVENOUS
  Filled 2012-07-07 (×3): qty 1000

## 2012-07-07 NOTE — Progress Notes (Addendum)
Subjective: Patient seen and examined, admitted with septic shock from UTI. Patient was hypotensive and he was admitted to ICU and required pressors. He was taken off pressors and BP has been stable, the urine culture grew proteus and currently he was on   vancomycin, zosyn, which has now been changed to cefepime, based on final sensitivities from urine culture. Patient is by mouth intake, discussed with patient's son at bedside. Discussed patient's poor quality of life, CODE STATUS, artificial nutrition hydration.  Objective: Vital signs in last 24 hours: Temp:  [98.1 F (36.7 C)-98.4 F (36.9 C)] 98.1 F (36.7 C) (01/06 0600) Pulse Rate:  [98-100] 99  (01/06 0600) Resp:  [18] 18  (01/06 0600) BP: (121-155)/(64-83) 121/70 mmHg (01/06 0600) SpO2:  [98 %-100 %] 100 % (01/06 0600) Weight change:  Last BM Date: 07/06/12  Consults: PCCM Antibiotics  Vancomycin 12/31>>> Zosyn 12/31>>>1/5 Cefepime 1/5 >>>  Procedures:   Intake/Output from previous day: 01/05 0701 - 01/06 0700 In: 2723.8 [P.O.:340; I.V.:1833.8; IV Piggyback:550] Out: 3350 [Urine:3350] Total I/O In: -  Out: 750 [Urine:750]   Physical Exam: Head: Normocephalic, atraumatic.  Eyes: No signs of jaundice, EOMI Nose: Mucous membranes dry.  Neck: supple,No deformities, masses, or tenderness noted. Lungs: Normal respiratory effort. B/L Clear to auscultation, no crackles or wheezes.  Heart: Regular RR. S1 and S2 normal  Abdomen: BS normoactive. Soft, Nondistended, non-tender.  Extremities: Bilateral AKA Neuro: alert, communicating, dysarthria   Lab Results: Basic Metabolic Panel:  Basename 07/07/12 0845 07/06/12 1020 07/05/12 0440  NA 139 141 --  K 3.4* 3.1* --  CL 100 104 --  CO2 26 28 --  GLUCOSE 95 89 --  BUN <3* 3* --  CREATININE 0.47* 0.57 --  CALCIUM 9.2 8.7 --  MG -- 1.7 1.5  PHOS -- -- --   Liver Function Tests: No results found for this basename: AST:2,ALT:2,ALKPHOS:2,BILITOT:2,PROT:2,ALBUMIN:2  in the last 72 hours No results found for this basename: LIPASE:2,AMYLASE:2 in the last 72 hours No results found for this basename: AMMONIA:2 in the last 72 hours CBC:  Basename 07/05/12 0440  WBC 6.6  NEUTROABS --  HGB 10.4*  HCT 29.5*  MCV 85.8  PLT 133*   CBG:  Basename 07/07/12 1142 07/07/12 0731 07/06/12 2127 07/06/12 1816 07/06/12 1219 07/06/12 0809  GLUCAP 91 115* 80 82 78 101*   Coagulation: No results found for this basename: LABPROT:2,INR:2 in the last 72 hours Urine Drug Screen:  Recent Results (from the past 240 hour(s))  CULTURE, BLOOD (ROUTINE X 2)     Status: Normal   Collection Time   06/30/12 11:35 PM      Component Value Range Status Comment   Specimen Description BLOOD RIGHT HAND   Final    Special Requests BOTTLES DRAWN AEROBIC AND ANAEROBIC 5CC   Final    Culture  Setup Time 07/01/2012 05:07   Final    Culture NO GROWTH 5 DAYS   Final    Report Status 07/07/2012 FINAL   Final   CULTURE, BLOOD (ROUTINE X 2)     Status: Normal   Collection Time   06/30/12 11:50 PM      Component Value Range Status Comment   Specimen Description BLOOD LEFT HAND   Final    Special Requests BOTTLES DRAWN AEROBIC AND ANAEROBIC 2CC   Final    Culture  Setup Time 07/01/2012 05:07   Final    Culture NO GROWTH 5 DAYS   Final    Report Status 07/07/2012  FINAL   Final   URINE CULTURE     Status: Normal   Collection Time   07/01/12 12:15 AM      Component Value Range Status Comment   Specimen Description URINE, CATHETERIZED   Final    Special Requests NONE   Final    Culture  Setup Time 07/01/2012 07:34   Final    Colony Count >=100,000 COLONIES/ML   Final    Culture PROTEUS MIRABILIS   Final    Report Status 07/03/2012 FINAL   Final    Organism ID, Bacteria PROTEUS MIRABILIS   Final   URINE CULTURE     Status: Normal   Collection Time   07/01/12  5:45 AM      Component Value Range Status Comment   Specimen Description URINE, CATHETERIZED   Final    Special Requests  NONE   Final    Culture  Setup Time 07/01/2012 09:04   Final    Colony Count 40,000 COLONIES/ML   Final    Culture     Final    Value: PROTEUS MIRABILIS     ENTEROCOCCUS SPECIES     PSEUDOMONAS AERUGINOSA   Report Status 07/06/2012 FINAL   Final    Organism ID, Bacteria PROTEUS MIRABILIS   Final    Organism ID, Bacteria ENTEROCOCCUS SPECIES   Final    Organism ID, Bacteria PSEUDOMONAS AERUGINOSA   Final   MRSA PCR SCREENING     Status: Abnormal   Collection Time   07/01/12 11:03 AM      Component Value Range Status Comment   MRSA by PCR POSITIVE (*) NEGATIVE Final     Studies/Results: No results found.  Medications: Scheduled Meds:    . ceFEPime (MAXIPIME) IV  1 g Intravenous Q24H  . famotidine  20 mg Oral BID  . insulin aspart  0-9 Units Subcutaneous TID WC  . levETIRAcetam  300 mg Oral BID  . phenytoin  100 mg Oral BID  . vancomycin  1,000 mg Intravenous Q12H   Continuous Infusions:    . sodium chloride 75 mL/hr at 07/07/12 0130   PRN Meds:.antiseptic oral rinse, sodium chloride  Assessment/Plan:  Principal Problem:  *Septic shock Active Problems:  HYPERTENSION, BENIGN SYSTEMIC  CEREBROVAS DIS, LATE EFFECTS (S/P CVA)  CONVULSIONS, SEIZURES, NOS  UTI (lower urinary tract infection)  HCAP (healthcare-associated pneumonia)  Acute encephalopathy  Coagulopathy  Hypokalemia  Hyperglycemia  Proteus septicemia  Septic shock Secondary to UTI Resolved  UTI Urine culture growing Proteus mirabilis, pseudomonas, enterococcus Pseudomonas is resistant to zosyn Will start cefepime and continue vancomycin Patient will need 7 more days of antibiotics Will put a PICC line in DC the central line Patient will be discharged on these antibiotics to the skilled nursing facility  Altered mental status H/o seizures, left VP shunt Discussed with son patient is back to his baseline   Possible healthcare associated pneumonia Continue IV antibiotics Vancomycin,  cefepime  Hypokalemia K still low Will replace potassium  CODE STATUS Discussed in the detail with the patient's son, at this time he understands patient's poor chordae of life and does not want to prolong the suffering incus patient suffers a cardiac arrest. He would like to make his father DO NOT RESUSCITATE. Also discussed with him the poor by mouth intake and artificial nutrition with PEG tube. Discussed the pros and the cons of putting a feeding tube, at this time  he agrees with not to put the PEG tube. And let him  be on the comfort feeds  Family wants palliative care consult for goals of care   LOS: 7 days   [facility] Pager: [phone] 07/07/2012, 12:15 PM

## 2012-07-07 NOTE — Progress Notes (Signed)
Patient has legal guardian [name], called and left a message for her. Will not order DNR unless confirmed with legal guardian. Patient to remain full code.

## 2012-07-07 NOTE — Progress Notes (Signed)
ANTIBIOTIC CONSULT NOTE - FOLLOW UP  Pharmacy Consult for Vancomycin and Cefepime Indication: Sepsis, UTI, HCAP  Allergies  Allergen Reactions  . Heparin Other (See Comments)    Hx of HIT in 2007    Patient Measurements: Height: 3' 4.55" (103 cm) Weight: 142 lb 10.2 oz (64.7 kg) IBW/kg (Calculated) : 5.27   Vital Signs: Temp: 98.1 F (36.7 C) (01/06 0600) Temp src: Oral (01/06 0600) BP: 121/70 mmHg (01/06 0600) Pulse Rate: 99  (01/06 0600) Intake/Output from previous day: 01/05 0701 - 01/06 0700 In: 2723.8 [P.O.:340; I.V.:1833.8; IV Piggyback:550] Out: 3350 [Urine:3350] Intake/Output from this shift:    Labs:  Basename 07/06/12 1020 07/05/12 0440  WBC -- 6.6  HGB -- 10.4*  PLT -- 133*  LABCREA -- --  CREATININE 0.57 0.55   Estimated Creatinine Clearance: 38.3 ml/min (by C-G formula based on Cr of 0.57).  Basename 07/07/12 0628  VANCOTROUGH 5.4*  VANCOPEAK --  Drue Dun --  GENTTROUGH --  GENTPEAK --  GENTRANDOM --  TOBRATROUGH --  TOBRAPEAK --  TOBRARND --  AMIKACINPEAK --  AMIKACINTROU --  AMIKACIN --     Microbiology: Recent Results (from the past 720 hour(s))  CULTURE, BLOOD (ROUTINE X 2)     Status: Normal (Preliminary result)   Collection Time   06/30/12 11:35 PM      Component Value Range Status Comment   Specimen Description BLOOD RIGHT HAND   Final    Special Requests BOTTLES DRAWN AEROBIC AND ANAEROBIC 5CC   Final    Culture  Setup Time 07/01/2012 05:07   Final    Culture     Final    Value:        BLOOD CULTURE RECEIVED NO GROWTH TO DATE CULTURE WILL BE HELD FOR 5 DAYS BEFORE ISSUING A FINAL NEGATIVE REPORT   Report Status PENDING   Incomplete   CULTURE, BLOOD (ROUTINE X 2)     Status: Normal (Preliminary result)   Collection Time   06/30/12 11:50 PM      Component Value Range Status Comment   Specimen Description BLOOD LEFT HAND   Final    Special Requests BOTTLES DRAWN AEROBIC AND ANAEROBIC 2CC   Final    Culture  Setup Time  07/01/2012 05:07   Final    Culture     Final    Value:        BLOOD CULTURE RECEIVED NO GROWTH TO DATE CULTURE WILL BE HELD FOR 5 DAYS BEFORE ISSUING A FINAL NEGATIVE REPORT   Report Status PENDING   Incomplete   URINE CULTURE     Status: Normal   Collection Time   07/01/12 12:15 AM      Component Value Range Status Comment   Specimen Description URINE, CATHETERIZED   Final    Special Requests NONE   Final    Culture  Setup Time 07/01/2012 07:34   Final    Colony Count >=100,000 COLONIES/ML   Final    Culture PROTEUS MIRABILIS   Final    Report Status 07/03/2012 FINAL   Final    Organism ID, Bacteria PROTEUS MIRABILIS   Final   URINE CULTURE     Status: Normal   Collection Time   07/01/12  5:45 AM      Component Value Range Status Comment   Specimen Description URINE, CATHETERIZED   Final    Special Requests NONE   Final    Culture  Setup Time 07/01/2012 09:04   Final  Colony Count 40,000 COLONIES/ML   Final    Culture     Final    Value: PROTEUS MIRABILIS     ENTEROCOCCUS SPECIES     PSEUDOMONAS AERUGINOSA   Report Status 07/06/2012 FINAL   Final    Organism ID, Bacteria PROTEUS MIRABILIS   Final    Organism ID, Bacteria ENTEROCOCCUS SPECIES   Final    Organism ID, Bacteria PSEUDOMONAS AERUGINOSA   Final   MRSA PCR SCREENING     Status: Abnormal   Collection Time   07/01/12 11:03 AM      Component Value Range Status Comment   MRSA by PCR POSITIVE (*) NEGATIVE Final     Antibiotics: 12/31 >> Cefepime x 1  12/31 >> Levaquin >> 01/02 12/31 >> Vanc >>  12/31 >> Zosyn >> 1/5 1/05   >> Cefepime >>   Assessment:  67 yo M admitted on 12/31 from nursing facility with septic shock from UTI, possible HCAP.  On day #7 broad spectrum antibiotics (D#7 vanc, D#2 cefepime - changed from zosyn on 1/5 due to pseudomonal suceptabilities)  Vancomycin trough subtherapeutic (5.4) on 1gm IV q24h  SCr stable, CrCl ~40 ml/min  Afeb, WBC normal (1/4)   Goal of Therapy:    Vancomycin trough level 15-20 mcg/ml  Plan:   Increase vancomycin to 1gm IV q12h  Recheck trough at steady state if continues (can this be dc'd since no MRSA?)  Continue cefepime 1gm IV q24h  Loralee Pacas, PharmD, BCPS Pager: [phone] 07/07/2012,7:51 AM

## 2012-07-07 NOTE — Progress Notes (Signed)
Palliative Medicine Team consult to confirm goals, hospice options requested by Dr Sharl Ma; spoke with guardian Michiel Sites [phone]; c: [phone]) she has spoken with patients son and family who are at bedside- Writer spoke with son Ines Bloomer and patient's sister Talbert Forest outside of room- meeting time confirmed for tomorrow Tuesday 07/08/12 @ 2:00 pm all parties including legal guardian Noreene Larsson will be available for the meeting.  Valente David, RN 07/07/2012, 4:26 PM Palliative Medicine Team RN Liaison [phone]

## 2012-07-07 NOTE — Progress Notes (Signed)
Before attempting to give patient his medicine, RN spooned a small amount of water on patient's tongue. Patient kept mouth wide open and did not attempt to swallow. At this time, RN does not feel like it is safe to administer patient's PO medicine.

## 2012-07-08 DIAGNOSIS — R0600 Dyspnea, unspecified: Secondary | ICD-10-CM

## 2012-07-08 DIAGNOSIS — K1370 Unspecified lesions of oral mucosa: Secondary | ICD-10-CM

## 2012-07-08 DIAGNOSIS — R627 Adult failure to thrive: Secondary | ICD-10-CM

## 2012-07-08 DIAGNOSIS — R569 Unspecified convulsions: Secondary | ICD-10-CM

## 2012-07-08 DIAGNOSIS — K117 Disturbances of salivary secretion: Secondary | ICD-10-CM

## 2012-07-08 DIAGNOSIS — R0609 Other forms of dyspnea: Secondary | ICD-10-CM

## 2012-07-08 DIAGNOSIS — R0989 Other specified symptoms and signs involving the circulatory and respiratory systems: Secondary | ICD-10-CM

## 2012-07-08 LAB — GLUCOSE, CAPILLARY
Glucose-Capillary: 94 mg/dL (ref 70–99)
Glucose-Capillary: 103 mg/dL — ABNORMAL HIGH (ref 70–99)

## 2012-07-08 LAB — BASIC METABOLIC PANEL
CO2: 27 mEq/L (ref 19–32)
Calcium: 9.3 mg/dL (ref 8.4–10.5)
Chloride: 100 mEq/L (ref 96–112)
Creatinine, Ser: 0.54 mg/dL (ref 0.50–1.35)
GFR calc Af Amer: >90 mL/min (ref >90)
Sodium: 138 mEq/L (ref 135–145)

## 2012-07-08 MED ORDER — POTASSIUM CHLORIDE 10 MEQ/100ML IV SOLN
10.0000 meq | INTRAVENOUS | Status: AC
  Administered 2012-07-08 (×3): 10 meq via INTRAVENOUS
  Filled 2012-07-08 (×3): qty 100

## 2012-07-08 MED ORDER — SCOPOLAMINE 1 MG/3DAYS TD PT72
1.0000 | MEDICATED_PATCH | TRANSDERMAL | Status: DC
  Administered 2012-07-08: 1.5 mg via TRANSDERMAL
  Filled 2012-07-08: qty 1

## 2012-07-08 MED ORDER — PHENYTOIN SODIUM 50 MG/ML IJ SOLN
100.0000 mg | Freq: Two times a day (BID) | INTRAMUSCULAR | Status: DC
  Administered 2012-07-08: 100 mg via INTRAVENOUS
  Filled 2012-07-08 (×2): qty 2

## 2012-07-08 MED ORDER — POTASSIUM CHLORIDE 20 MEQ/15ML (10%) PO LIQD
40.0000 meq | Freq: Two times a day (BID) | ORAL | Status: DC
  Filled 2012-07-08 (×2): qty 30

## 2012-07-08 MED ORDER — BISACODYL 10 MG RE SUPP: 10.0000 mg | Freq: Every day | RECTAL | Status: DC | PRN

## 2012-07-08 MED ORDER — SODIUM CHLORIDE 0.9 % IV SOLN
500.0000 mg | Freq: Two times a day (BID) | INTRAVENOUS | Status: DC
  Administered 2012-07-08: 500 mg via INTRAVENOUS
  Filled 2012-07-08 (×2): qty 5

## 2012-07-08 MED ORDER — MORPHINE SULFATE (CONCENTRATE) 20 MG/ML PO SOLN: 5.0000 mg | ORAL | Status: DC | PRN

## 2012-07-08 MED ORDER — LORAZEPAM 1 MG PO TABS: 1.0000 mg | ORAL_TABLET | Freq: Three times a day (TID) | ORAL | Status: DC

## 2012-07-08 NOTE — Progress Notes (Addendum)
Subjective: Patient seen and examined, admitted with septic shock from UTI. Patient was hypotensive and he was admitted to ICU and required pressors. He was taken off pressors and BP has been stable, the urine culture grew proteus and currently he was on   vancomycin, zosyn, which has now been changed to cefepime, based on final sensitivities from urine culture. Patient has poor mouth intake, has been refusing all the po medications. Objective: Vital signs in last 24 hours: Temp:  [98.2 F (36.8 C)-98.6 F (37 C)] 98.6 F (37 C) (01/07 0529) Pulse Rate:  [99-105] 99  (01/07 0529) Resp:  [18] 18  (01/07 0529) BP: (112-143)/(67-79) 112/67 mmHg (01/07 0529) SpO2:  [95 %-98 %] 97 % (01/07 0529) Weight:  [60.328 kg (133 lb)] 60.328 kg (133 lb) (01/07 0500) Weight change:  Last BM Date: 07/06/12  Consults: PCCM Palliative  Care  Antibiotics  Vancomycin 12/31>>> Zosyn 12/31>>>1/5 Cefepime 1/5 >>>  Procedures: None  Intake/Output from previous day: 01/06 0701 - 01/07 0700 In: 2318.8 [I.V.:1868.8; IV Piggyback:450] Out: 2650 [Urine:2650]     Physical Exam: Head: Normocephalic, atraumatic.  Eyes: No signs of jaundice, EOMI Nose: Mucous membranes dry.  Neck: supple,No deformities, masses, or tenderness noted. Lungs: Normal respiratory effort. B/L Clear to auscultation, no crackles or wheezes.  Heart: Regular RR. S1 and S2 normal  Abdomen: BS normoactive. Soft, Nondistended, non-tender.  Extremities: Bilateral AKA Neuro: alert, communicating, dysarthria   Lab Results: Basic Metabolic Panel:  Basename 07/08/12 0500 07/07/12 0845 07/06/12 1020  NA 138 139 --  K 3.1* 3.4* --  CL 100 100 --  CO2 27 26 --  GLUCOSE 114* 95 --  BUN 3* <3* --  CREATININE 0.54 0.47* --  CALCIUM 9.3 9.2 --  MG -- -- 1.7  PHOS -- -- --   CBC: No results found for this basename: WBC:2,NEUTROABS:2,HGB:2,HCT:2,MCV:2,PLT:2 in the last 72 hours CBG:  Basename 07/08/12 0745 07/07/12 2152 07/07/12  1755 07/07/12 1142 07/07/12 0731 07/06/12 2127  GLUCAP 107* 107* 83 91 115* 80   Coagulation: No results found for this basename: LABPROT:2,INR:2 in the last 72 hours Urine Drug Screen:  Recent Results (from the past 240 hour(s))  CULTURE, BLOOD (ROUTINE X 2)     Status: Normal   Collection Time   06/30/12 11:35 PM      Component Value Range Status Comment   Specimen Description BLOOD RIGHT HAND   Final    Special Requests BOTTLES DRAWN AEROBIC AND ANAEROBIC 5CC   Final    Culture  Setup Time 07/01/2012 05:07   Final    Culture NO GROWTH 5 DAYS   Final    Report Status 07/07/2012 FINAL   Final   CULTURE, BLOOD (ROUTINE X 2)     Status: Normal   Collection Time   06/30/12 11:50 PM      Component Value Range Status Comment   Specimen Description BLOOD LEFT HAND   Final    Special Requests BOTTLES DRAWN AEROBIC AND ANAEROBIC 2CC   Final    Culture  Setup Time 07/01/2012 05:07   Final    Culture NO GROWTH 5 DAYS   Final    Report Status 07/07/2012 FINAL   Final   URINE CULTURE     Status: Normal   Collection Time   07/01/12 12:15 AM      Component Value Range Status Comment   Specimen Description URINE, CATHETERIZED   Final    Special Requests NONE   Final  Culture  Setup Time 07/01/2012 07:34   Final    Colony Count >=100,000 COLONIES/ML   Final    Culture PROTEUS MIRABILIS   Final    Report Status 07/03/2012 FINAL   Final    Organism ID, Bacteria PROTEUS MIRABILIS   Final   URINE CULTURE     Status: Normal   Collection Time   07/01/12  5:45 AM      Component Value Range Status Comment   Specimen Description URINE, CATHETERIZED   Final    Special Requests NONE   Final    Culture  Setup Time 07/01/2012 09:04   Final    Colony Count 40,000 COLONIES/ML   Final    Culture     Final    Value: PROTEUS MIRABILIS     ENTEROCOCCUS SPECIES     PSEUDOMONAS AERUGINOSA   Report Status 07/06/2012 FINAL   Final    Organism ID, Bacteria PROTEUS MIRABILIS   Final    Organism ID,  Bacteria ENTEROCOCCUS SPECIES   Final    Organism ID, Bacteria PSEUDOMONAS AERUGINOSA   Final   MRSA PCR SCREENING     Status: Abnormal   Collection Time   07/01/12 11:03 AM      Component Value Range Status Comment   MRSA by PCR POSITIVE (*) NEGATIVE Final     Studies/Results: No results found.  Medications: Scheduled Meds:    . ceFEPime (MAXIPIME) IV  1 g Intravenous Q24H  . famotidine  20 mg Oral BID  . insulin aspart  0-9 Units Subcutaneous TID WC  . levETIRAcetam  300 mg Oral BID  . phenytoin (DILANTIN) IV  100 mg Intravenous Q12H  . potassium chloride  10 mEq Intravenous Q1 Hr x 3  . potassium chloride  40 mEq Oral BID  . vancomycin  1,000 mg Intravenous Q12H   Continuous Infusions:    . 0.45 % NaCl with KCl 20 mEq / L 75 mL/hr at 07/08/12 0357   PRN Meds:.antiseptic oral rinse, sodium chloride  Assessment/Plan:  Principal Problem:  *Septic shock Active Problems:  HYPERTENSION, BENIGN SYSTEMIC  CEREBROVAS DIS, LATE EFFECTS (S/P CVA)  CONVULSIONS, SEIZURES, NOS  UTI (lower urinary tract infection)  HCAP (healthcare-associated pneumonia)  Acute encephalopathy  Coagulopathy  Hypokalemia  Hyperglycemia  Proteus septicemia  Septic shock Secondary to UTI Resolved  UTI Urine culture growing Proteus mirabilis, pseudomonas, enterococcus Pseudomonas is resistant to zosyn Will start cefepime and continue vancomycin Patient will need 7 more days of antibiotics Will put a PICC line and  DC the central line Patient will be discharged on these antibiotics to the skilled nursing facility  Altered mental status Patient is s/p VP shunt, has h/o TBI Discussed with son patient is back to his baseline  Seizures Patient is refusing all po meds Will change the keppra and diliantin to IV   Possible healthcare associated pneumonia Continue IV antibiotics Vancomycin, cefepime Total seven more days of antibiotics  Hypokalemia K still low Will replace  potassium  CODE STATUS Discussed in the detail with the patient's son, at this time he understands patient's poor chordae of life and does not want to prolong the suffering incus patient suffers a cardiac arrest. He would like to make his father DO NOT RESUSCITATE. Also discussed with him the poor by mouth intake and artificial nutrition with PEG tube. Discussed the pros and the cons of putting a feeding tube, at this time  he agrees with not to put the PEG tube. And  let him be on the comfort feeds  Palliative care meeting today at 2 pm   Dispo: Depending upon outcome of palliative care meeting   LOS: 8 days   [facility] Pager: [phone] 07/08/2012, 11:10 AM

## 2012-07-08 NOTE — Progress Notes (Signed)
Went by room. Patient asleep; did not wake him. No family or friends present at time of visit. Presence. Will follow up.

## 2012-07-08 NOTE — Progress Notes (Signed)
Speech Language Pathology Dysphagia Treatment Patient Details Name: [name] MRN: [id number] DOB: April 29, 1946 Today's Date: 07/08/2012 Time: 1478-2956 SLP Time Calculation (min): 15 min  Assessment / Plan / Recommendation Clinical Impression  Follow up to assess tolerance of diet, need for dietary modification.  Note palliative meeting planned today.  RN reports pt continuing to decline po intake and medications and per RN stated son said pt is a "picky eat" but would eat peanut butter and jelly sanchwiches. SLP noted pt is having problems managing his secretions currently - anterior spillage bilaterally noted and wet voice.   He did not follow directions to cough to clear secretions- nor attempt to swallow.   Approx 1/4 tsp icecream placed in mouth without pt attempt to orally manipulation  SLP removed bolus with washcloth and moist toothette.  SLP offered pt strawberry cereal bar and soda but he declined all po.  Pt's speech remains unintelligible but pt did nod yes/no appropriately to questions.  Pt indicates lack of desire for any intake via nodding- even when asked re: sandwich, any other po.  Consistency of diet does not appear to impact his current intake as he is refusing all offerings.      SLP to sign off currently as pt without intake and decr management of secretions.  Left written information re: comfort feeding for family after palliative meeting if indicated (left in [place] and educated RN to information available).      SLP noted that pt was placed on comfort po previous date by MD after his discussion with family.  Suspect intake and secretion management will continue to be poor.  Rec oral care continue and OFFERING of po for comfort only when pt fully alert.    Please reconsult if desire.      Diet Recommendation  Initiate / Change Diet: Dysphagia 1 (puree);Thin liquid (for comfort ONLY)    SLP Plan Discharge SLP treatment due to (comment) (lack of progress, poor intake -  poor secretion management)   Pertinent Vitals/Pain Afebrile, room air, decr lung sounds   Swallowing Goals  SLP Swallowing Goals Swallow Study Goal #2 - Progress: Not met  General Temperature Spikes Noted: No Respiratory Status: Room air Behavior/Cognition: Alert;Uncooperative;Distractible;Doesn't follow directions;Decreased sustained attention Oral Cavity - Dentition: Edentulous Patient Positioning: Upright in bed  Oral Cavity - Oral Hygiene   oral cavity with retained secretions that pt is not swallowing  Dysphagia Treatment Treatment focused on: Patient/family/caregiver education Family/Caregiver Educated: pt Patient observed directly with PO's: Yes Type of PO's observed:  (small bolus of icecream able to place in pt's mouth) Feeding: Total assist Oral Phase Signs & Symptoms: Anterior loss/spillage (pt did not manipulate icecream,slp remove w/toothette) Type of cueing: Verbal Amount of cueing: Total (pt refused all intake offered)   GO     Donavan Burnet, MS [facility] SLP [phone]

## 2012-07-08 NOTE — Consult Note (Signed)
Patient [name]      DOB: 06/24/1946      XBM:[id number]     Consult Note from the Palliative Medicine Team at [facility]    Consult Requested by: Dr Sharl Ma     PCP: Bufford Spikes, DO Reason for Consultation:Clarification of GOC and options     Phone [phone]  Assessment of patients Current state: Long complicated medical history significant for head trauma many years ago, CVA, seizure disorder, DM/B AKA, bed bound/total care admitted with septic shock from UTI from SNF, overall failure to thrive with sips for intake only  Consult is for review of medical treatment options, clarification of goals of care and end of life issues, disposition and options, and symptom recommendation.  This NP Lorinda Creed reviewed medical records, received report from team, assessed the patient and then meet at the patient's bedside along with his large family to include 5 siblings, a niece and  his son [name] [phone]  to discuss diagnosis prognosis, GOC, EOL wishes disposition and options.  Legal guardian representative Michiel Sites # [phone] DSS presetn and involved in conversation   A detailed discussion was had today regarding advanced directives.  Concepts specific to code status, artifical feeding and hydration, continued IV antibiotics and rehospitalization was had.  The difference between a aggressive medical intervention path  and a palliative comfort care path for this patient at this time was had.  Values and goals of care important to patient and family were attempted to be elicited.  Concept of Hospice and Palliative Care were discussed  Natural trajectory and expectations at EOL were discussed.  Questions and concerns addressed.  Hard Choices booklet left for review. Family encouraged to call with questions or concerns.  PMT will continue to support holistically  Goals of Care: 1.  Code Status:DNR/DNI   Comfort is main focus of care  MOST form completed  2.  Scope of Treatment: 1. Vital Signs:daily  2. Respiratory/Oxygen: only for comfort 3. Nutritional Support/Tube Feeds: no artificial feeding now or in the future 4. Antibiotics: none 5. Blood Products:none 6. IVF: no artificial hydration now or in the future 7. Review of Medications to be discontinued: minimize for comfort 8. Labs: none 9. Telemetry:none   4. Disposition: Disposition back to SNF with hospice services.   3. Symptom Management:   1. Anxiety/Agitation:Ativan 2. Pain:/Dyspnea: Roxanol  3. Bowel Regimen:Dulcolax 4. Terminal Secretions: Scopolomine  4. Psychosocial:  Emotional support offered to large family, including son [name]  5. Spiritual:  Chaplain services consulted    Brief PIR:JJOA complicated medical history significant for head trauma many years ago, CVA, seizure disorder, DM/B AKA, bed bound/total care admitted with septic shock from UTI from SNF     ROS:  Unable to elicit due to altered mental status    PMH:  Past Medical History  Diagnosis Date  . Diabetes mellitus   . Orchitis and epididymitis in disease classified elsewhere   . Unspecified intestinal obstruction   . Unspecified osteomyelitis, site unspecified   . Encounter for long-term (current) use of other medications   . Hypertrophy of prostate without urinary obstruction and other lower urinary tract symptoms (LUTS)   . Depressive disorder, not elsewhere classified   . Edema of penis   . Pulmonary congestion and hypostasis   . Other ascites   . Thrombocytopenia   . Urinary tract infection, site not specified   . Chronic ulcer of other specified site   . Acute, but ill-defined, cerebrovascular  disease   . Intestinal infection due to E. coli, unspecified   . Other convulsions   . Unspecified essential hypertension   . Urinary tract infection, site not specified   . Acute upper respiratory infections of unspecified site   . Other chronic pain   . Unspecified psychosis   .  Unspecified constipation   . Acute kidney failure, unspecified      PSH: Past Surgical History  Procedure Date  . 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 Vp shunt placement 1990  . Circumcision 2007  . Suprapubic catheter placement 2007   I have reviewed the FH and SH and  If appropriate update it with new information. Allergies  Allergen Reactions  . Heparin Other (See Comments)    Hx of HIT in 2007   Scheduled Meds:   . ceFEPime (MAXIPIME) IV  1 g Intravenous Q24H  . famotidine  20 mg Oral BID  . insulin aspart  0-9 Units Subcutaneous TID WC  . levetiracetam  500 mg Intravenous Q12H  . phenytoin (DILANTIN) IV  100 mg Intravenous Q12H  . potassium chloride  40 mEq Oral BID  . vancomycin  1,000 mg Intravenous Q12H   Continuous Infusions:   . 0.45 % NaCl with KCl 20 mEq / L 75 mL/hr at 07/08/12 0357   PRN Meds:.antiseptic oral rinse, sodium chloride    BP 112/67  Pulse 99  Temp 98.6 F (37 C) (Axillary)  Resp 18  Ht 3' 4.55" (1.03 m)  Wt 60.328 kg (133 lb)  BMI 56.87 kg/m2  SpO2 97%   PPS:20%   Intake/Output Summary (Last 24 hours) at 07/08/12 1224 Last data filed at 07/08/12 0656  Gross per 24 hour  Intake 2318.75 ml  Output   1900 ml  Net 418.75 ml                          Stool Softner: ducolax supp  Physical Exam:  General: Chronically ill appearing, NAD HEENT:  dry buccal membranes Chest:   diminished in bases, CTA CVS: RRR Abdomen: soft NT +BS Ext: B AKA Neuro: non verbal, unable to follow commands     Labs: CBC    Component Value Date/Time   WBC 6.6 07/05/2012 0440   RBC 3.44* 07/05/2012 0440   HGB 10.4* 07/05/2012 0440   HCT 29.5* 07/05/2012 0440   PLT 133* 07/05/2012 0440   MCV 85.8 07/05/2012 0440   MCH 30.2 07/05/2012 0440   MCHC 35.3 07/05/2012 0440   RDW 13.1 07/05/2012 0440   LYMPHSABS 1.3 06/30/2012 2335   MONOABS 1.5* 06/30/2012 2335   EOSABS 0.0 06/30/2012 2335   BASOSABS 0.0 06/30/2012 2335    BMET      Component Value Date/Time   NA 138 07/08/2012 0500   K 3.1* 07/08/2012 0500   CL 100 07/08/2012 0500   CO2 27 07/08/2012 0500   GLUCOSE 114* 07/08/2012 0500   BUN 3* 07/08/2012 0500   CREATININE 0.54 07/08/2012 0500   CALCIUM 9.3 07/08/2012 0500   GFRNONAA >90 07/08/2012 0500   GFRAA >90 07/08/2012 0500    CMP     Component Value Date/Time   NA 138 07/08/2012 0500   K 3.1* 07/08/2012 0500   CL 100 07/08/2012 0500   CO2 27 07/08/2012 0500   GLUCOSE 114* 07/08/2012 0500   BUN 3* 07/08/2012 0500   CREATININE 0.54 07/08/2012 0500   CALCIUM  9.3 07/08/2012 0500   PROT 5.4* 07/04/2012 0455   ALBUMIN 2.3* 07/04/2012 0455   AST 16 07/04/2012 0455   ALT 34 07/04/2012 0455   ALKPHOS 43 07/04/2012 0455   BILITOT 0.5 07/04/2012 0455   GFRNONAA >90 07/08/2012 0500   GFRAA >90 07/08/2012 0500      Time In Time Out Total Time Spent with Patient Total Overall Time  1400 1605 115 min  125 min    Greater than 50%  of this time was spent counseling and coordinating care related to the above assessment and plan.  Dr Sharl Ma aware of above  Lorinda Creed NP  [phone]

## 2012-07-08 NOTE — Progress Notes (Signed)
MEDICATION RELATED CONSULT NOTE - follow up  Pharmacy Consult for Phenytoin Indication: Hx of sz, on phenytoin PTA  Allergies  Allergen Reactions  . Heparin Other (See Comments)    Hx of HIT in 2007    Patient Measurements:  Height: 3' 4.55" (103 cm) Weight: 133 lb (60.328 kg) IBW/kg (Calculated) : 5.27   Vital Signs: Temp: 98.6 F (37 C) (01/07 0529) Temp src: Axillary (01/07 0529) BP: 112/67 mmHg (01/07 0529) Pulse Rate: 99  (01/07 0529)  Labs:  Basename 07/08/12 0500 07/07/12 0845 07/06/12 1020  WBC -- -- --  HGB -- -- --  HCT -- -- --  PLT -- -- --  APTT -- -- --  CREATININE 0.54 0.47* 0.57  LABCREA -- -- --  CREATININE 0.54 0.47* 0.57  CREAT24HRUR -- -- --  MG -- -- 1.7  PHOS -- -- --  ALBUMIN -- -- --  PROT -- -- --  ALBUMIN -- -- --  AST -- -- --  ALT -- -- --  ALKPHOS -- -- --  BILITOT -- -- --  BILIDIR -- -- --  IBILI -- -- --   Estimated Creatinine Clearance: 36 ml/min (by C-G formula based on Cr of 0.54).  Medications:  Scheduled:     . ceFEPime (MAXIPIME) IV  1 g Intravenous Q24H  . famotidine  20 mg Oral BID  . insulin aspart  0-9 Units Subcutaneous TID WC  . levETIRAcetam  300 mg Oral BID  . phenytoin  100 mg Oral BID  . potassium chloride  10 mEq Intravenous Q1 Hr x 3  . potassium chloride  40 mEq Oral BID  . vancomycin  1,000 mg Intravenous Q12H    Assessment:  62 YOM w/hx sz d/o admitted from nursing facility 12/31 w/ seizures   PTA phenytoin was reportedly 100mg  PO BID and Keppra 300mg  PO BID PTA  Given Loading dose of PHT in ER (1000mg  IV x 1) @ 1730 12/31  PHT 100mg  IV q8h started 12/31 @ 2200  Phenytoin total level 1/1 = 12.8 (corrected for low albumin = 21). PHT level this AM = 15.1, corrected = 27  MD changed PHT to home dose of 100mg  PO BID on 1/3  Although level on 1/4 was elevated, suspect decrease back to home dose will achieve tx levels.   PO doses have not been given over past 24hrs, therefore, checking a  level at this point would not be helpful   Goal of Therapy:  Therapeutic phenytoin level (Free phenytoin: 1-2.5 mcg/mL, Total phenytoin: 10-20 mcg/ml)  Plan:   Discussed with MD, will change to equivalent dose of IV phenytoin - 100mg  IV q12h  Recommend recheck level 5-7 days to ensure tx levels at this dose  Recommend monitor for sx PHT toxicity (Nystagmus, blurred vision, diplopia, lethargy, dizziness)  Loralee Pacas, PharmD, BCPS Pager: [phone] 07/08/2012 10:02 AM

## 2012-07-08 NOTE — Plan of Care (Addendum)
Problem: Phase I Progression Outcomes Goal: OOB as tolerated unless otherwise ordered Outcome: Not Met (add Reason) Bilat AKA

## 2012-07-09 MED ORDER — MORPHINE SULFATE (CONCENTRATE) 20 MG/ML PO SOLN: 5.0000 mg | ORAL | Status: DC | PRN

## 2012-07-09 MED ORDER — SCOPOLAMINE 1 MG/3DAYS TD PT72: 1.0000 | MEDICATED_PATCH | TRANSDERMAL | Status: DC

## 2012-07-09 MED ORDER — LORAZEPAM 1 MG PO TABS: 1.0000 mg | ORAL_TABLET | Freq: Three times a day (TID) | ORAL | Status: DC

## 2012-07-09 NOTE — Progress Notes (Signed)
Went by patient's room. Patient did not respond to his name being called, appeared to be asleep.  Spoke with nurse who told me he is only minimally responsive. No family present. Nurse said family has not been here today. Would staff please page a chaplain when family is present, [phone].  Presence.

## 2012-07-09 NOTE — Discharge Summary (Signed)
Physician Discharge Summary  [name]:[id number] DOB: 12-May-1946 DOA: 06/30/2012  PCP: Bufford Spikes, DO  Admit date: 06/30/2012 Discharge date: 07/09/2012  Time spent: > 35 minutes  Recommendations for Outpatient Follow-up:  1. Please make sure comfort care measures are followed through as per family and patient's wishes  Discharge Diagnoses:  Principal Problem:  *Septic shock Active Problems:  HYPERTENSION, BENIGN SYSTEMIC  CEREBROVAS DIS, LATE EFFECTS (S/P CVA)  CONVULSIONS, SEIZURES, NOS  UTI (lower urinary tract infection)  HCAP (healthcare-associated pneumonia)  Acute encephalopathy  Coagulopathy  Hypokalemia  Hyperglycemia  Proteus septicemia  Increased oropharyngeal secretions  Dyspnea  Adult failure to thrive   Discharge Condition: stable  Diet recommendation: dysphagia 1 diet  Filed Weights   07/05/12 0410 07/06/12 0438 07/08/12 0500  Weight: 66.9 kg (147 lb 7.8 oz) 64.7 kg (142 lb 10.2 oz) 60.328 kg (133 lb)    History of present illness:  From original HPI: 67 y/o male with known seizure disorder presented to the [facility] ED on 12/31 with seizures and septic shock presumed from a UTI. He was found to be hypotensive despite 5L NS so PCCM consulted for admission.   Patient reportedly also with long complicated medical history significant forehead trauma years ago, CVA, seizure d/o DM/B AKA, bed bound/total care.  Hospital Course:  67 y/o with history as mentioned above admitted for UTI sepsis and currently failure to thrive. Patient initially treated and given his overall failure to thrive with sips and intake it was decided to consult palliative.  According to Skeet Simmer note: Assessment of patients Current state: Long complicated medical history significant for head trauma many years ago, CVA, seizure disorder, DM/B AKA, bed bound/total care admitted with septic shock from UTI from SNF, overall failure to thrive with sips for intake only  Consult is  for review of medical treatment options, clarification of goals of care and end of life issues, disposition and options, and symptom recommendation.  This NP Lorinda Creed reviewed medical records, received report from team, assessed the patient and then meet at the patient's bedside along with his large family to include 5 siblings, a niece and his son [name] [phone] to discuss diagnosis prognosis, GOC, EOL wishes disposition and options. Legal guardian representative Michiel Sites # [phone] DSS presetn and involved in conversation  A detailed discussion was had today regarding advanced directives. Concepts specific to code status, artifical feeding and hydration, continued IV antibiotics and rehospitalization was had. The difference between a aggressive medical intervention path and a palliative comfort care path for this patient at this time was had. Values and goals of care important to patient and family were attempted to be elicited.  Concept of Hospice and Palliative Care were discussed  Natural trajectory and expectations at EOL were discussed. Questions and concerns addressed. Hard Choices booklet left for review. Family encouraged to call with questions or concerns. PMT will continue to support holistically  Goals of Care:  1. Code Status:DNR/DNI Comfort is main focus of care  MOST form completed  2. Scope of Treatment:  1. Vital Signs:daily  2. Respiratory/Oxygen: only for comfort 3. Nutritional Support/Tube Feeds: no artificial feeding now or in the future 4. Antibiotics: none 5. Blood Products:none 6. IVF: no artificial hydration now or in the future 7. Review of Medications to be discontinued: minimize for comfort 8. Labs: none 9. Telemetry:none 4. Disposition: Disposition back to SNF with hospice services.  3. Symptom Management:  1. Anxiety/Agitation:Ativan 2. Pain:/Dyspnea: Roxanol  3. Bowel  Regimen:Dulcolax 4. Terminal Secretions: Scopolomine 4. Psychosocial:  Emotional support offered to large family, including son [name]  5. Spiritual: Chaplain services consulted  At this juncture will plan on transitioning patient back to SNF to carry out comfort care measures per family wishes.  Procedures:  Please refer to chart  Consultations:  Palliative care  Discharge Exam: Filed Vitals:   07/08/12 0529 07/08/12 1400 07/08/12 2105 07/09/12 0518  BP: 112/67 128/71 140/79 106/64  Pulse: 99 90 90 101  Temp: 98.6 F (37 C) 98.2 F (36.8 C) 98.4 F (36.9 C) 97.9 F (36.6 C)  TempSrc: Axillary Axillary Oral Oral  Resp: 18 18 18 16   Height:      Weight:      SpO2: 97% 95% 98% 99%    General: Pt limited response with examiner, gazes in my general direction, comfortable Cardiovascular: RRR, No rubs Respiratory: poor inspiratory effort, breath sounds BL  Discharge Instructions  Discharge Orders    Future Orders Please Complete By Expires   Diet - low sodium heart healthy      Increase activity slowly      Discharge instructions      Comments:   Please be sure to follow up with home hospice and palliative care doctor at facility   Call MD for:  severe uncontrolled pain          Medication List     As of 07/09/2012  1:17 PM    STOP taking these medications         bisacodyl 10 MG suppository   Commonly known as: DULCOLAX      cholecalciferol 1000 UNITS tablet   Commonly known as: VITAMIN D      feeding supplement Liqd      levETIRAcetam 100 MG/ML solution   Commonly known as: KEPPRA      LORazepam 2 MG/ML injection   Commonly known as: ATIVAN      magnesium oxide 400 MG tablet   Commonly known as: MAG-OX      multivitamin with minerals Tabs      oxybutynin 5 MG tablet   Commonly known as: DITROPAN      phenytoin 100 MG ER capsule   Commonly known as: DILANTIN      polyethylene glycol powder powder   Commonly known as: GLYCOLAX/MIRALAX      ranitidine 150 MG tablet   Commonly known as: ZANTAC       simvastatin 40 MG tablet   Commonly known as: ZOCOR      TAKE these medications         LORazepam 1 MG tablet   Commonly known as: ATIVAN   Take 1 tablet (1 mg total) by mouth every 8 (eight) hours.      morphine 20 MG/ML concentrated solution   Commonly known as: ROXANOL   Take 0.25 mLs (5 mg total) by mouth every 2 (two) hours as needed.      scopolamine 1.5 MG   Commonly known as: TRANSDERM-SCOP   Place 1 patch (1.5 mg total) onto the skin every 3 (three) days.          The results of significant diagnostics from this hospitalization (including imaging, microbiology, ancillary and laboratory) are listed below for reference.    Significant Diagnostic Studies: Ct Head Wo Contrast  07/01/2012  *RADIOLOGY REPORT*  Clinical Data: Seizure.  Known seizure disorder.  Altered level of consciousness.  CT HEAD WITHOUT CONTRAST  Technique:  Contiguous axial images were obtained  from the base of the skull through the vertex without contrast.  Comparison: 06/05/2008.  Findings: The ventricular shunt catheter is in good position, unchanged.  The tip is in the third ventricle.  Stable mild ventricular dilatation, stable mild ventricular dilatation.  Stable periventricular white matter changes.  No extra-axial fluid collections.  No CT findings for acute hemispheric infarction and/or intracranial hemorrhage.  The bony structures are intact and stable.  The paranasal sinuses and mastoid air cells are clear except for scattered ethmoid disease.  The globes are intact.  IMPRESSION:  1.  Stable CT appearance of the brain since 2009.  The ventricular shunt catheter is stable and ventricular size is unchanged.  Stable periventricular white matter disease. 2.  No acute intracranial findings or mass lesion.   Original Report Authenticated By: Rudie Meyer, M.D.    US Abdomen Complete  07/01/2012  *RADIOLOGY REPORT*  Clinical Data:  Elevated LFTs; fever.  ABDOMINAL ULTRASOUND COMPLETE  Comparison:  CT of  the abdomen and pelvis performed 04/04/2012  Findings:  Gallbladder:  Several prominent stones are noted within the gallbladder, measuring up to 0.9 cm in size.  No gallbladder wall thickening or pericholecystic fluid is seen.  No ultrasonographic Murphy's sign is elicited.  Common Bile Duct:  0.6 cm in diameter; within normal limits in caliber.  Liver:  A "starry sky" appearance is suggested within the liver, with prominence of the portal triads; no focal lesions identified. Limited Doppler evaluation demonstrates normal blood flow within the liver.  IVC:  Unremarkable in appearance.  Pancreas:  Although the pancreas is difficult to visualize in its entirety due to overlying bowel gas, no focal pancreatic abnormality is identified.  Spleen:  8.2 cm in length; within normal limits in size and echotexture.  Right kidney:  10.4 cm in length; normal in size, configuration and parenchymal echogenicity.  Mild renal cortical thinning is noted. No evidence of mass or hydronephrosis.  Left kidney:  11.2 cm in length; normal in size, configuration and parenchymal echogenicity.  No evidence of mass or hydronephrosis.  Abdominal Aorta: Not visualized due to overlying bowel gas.  IMPRESSION:  1.  The appearance of the liver suggests mild hepatitis. 2.  Cholelithiasis; gallbladder otherwise unremarkable in appearance. 3.  Mild right renal cortical thinning noted; this may reflect chronic renal disease.   Original Report Authenticated By: Tonia Ghent, M.D.    Dg Chest Port 1 View  07/05/2012  *RADIOLOGY REPORT*  Clinical Data: Pneumonia.  Septic shock.  Urinary tract infection.  PORTABLE CHEST - 1 VIEW  Comparison: 07/02/2012  Findings: Increased opacity is seen in the left lower lobe silhouetting the left hemidiaphragm.  Mild right lower lung opacity shows no significant change.  Heart size is stable.  No definite pleural effusion seen.  Right jugular center venous catheter remains in appropriate position and VP shunt tubing  is again seen in the left chest wall.  IMPRESSION:  1.  Increased left lower lobe infiltrate versus atelectasis. 2.  Stable mild right lower lung opacity.   Original Report Authenticated By: Myles Rosenthal, M.D.    Dg Chest Port 1 View  07/02/2012  *RADIOLOGY REPORT*  Clinical Data: Follow infiltrates.  PORTABLE CHEST - 1 VIEW  Comparison: 07/01/2012  Findings: Right IJ central line tip overlies the level of the superior vena cava.  A ventriculoperitoneal shunt courses across the left chest.  The heart is enlarged.  There is patchy density at the lung bases bilaterally, right greater than left.  Suspect left  pleural effusion.  IMPRESSION: Little interval change.   Original Report Authenticated By: Norva Pavlov, M.D.    Dg Chest Portable 1 View  07/01/2012  *RADIOLOGY REPORT*  Clinical Data: Central line placement.  Seizures.  PORTABLE CHEST - 1 VIEW  Comparison: Chest radiograph performed 06/30/2012  Findings: A right IJ line is noted ending about the proximal to mid SVC.  The patient's ventriculoperitoneal shunt is grossly unchanged in appearance.  The lungs are relatively well expanded.  Mild bibasilar airspace opacities, and mild left midlung opacity, may reflect atelectasis or pneumonia.  No definite pleural effusion or pneumothorax is seen.  The cardiomediastinal silhouette is borderline normal in size; calcification is noted in the aortic arch.  No acute osseous abnormalities are identified.  IMPRESSION:  1.  Right IJ line noted ending about the proximal to mid SVC. 2.  Mild bibasilar airspace opacities, and mild left midlung opacity, may reflect atelectasis or pneumonia.   Original Report Authenticated By: Tonia Ghent, M.D.    Dg Chest Port 1 View  06/30/2012  *RADIOLOGY REPORT*  Clinical Data: 67 year old male with fever, altered mental status, possible sepsis.  PORTABLE CHEST - 1 VIEW  Comparison: 04/25/2012 and earlier.  Findings: Portable supine AP view 2325 hours.  Stable lung volumes. Left  neck chest and abdomen and shunt catheter re-identified. Cardiac size and mediastinal contours are within normal limits. Visualized tracheal air column is within normal limits.  No pulmonary edema, definite effusion or pneumothorax.  Streaky left lung base opacity.  IMPRESSION: Streaky left lung base opacity.  Cannot exclude developing left lung base pneumonia in this setting.  Otherwise stable chest.   Original Report Authenticated By: Erskine Speed, M.D.     Microbiology: Recent Results (from the past 240 hour(s))  CULTURE, BLOOD (ROUTINE X 2)     Status: Normal   Collection Time   06/30/12 11:35 PM      Component Value Range Status Comment   Specimen Description BLOOD RIGHT HAND   Final    Special Requests BOTTLES DRAWN AEROBIC AND ANAEROBIC 5CC   Final    Culture  Setup Time 07/01/2012 05:07   Final    Culture NO GROWTH 5 DAYS   Final    Report Status 07/07/2012 FINAL   Final   CULTURE, BLOOD (ROUTINE X 2)     Status: Normal   Collection Time   06/30/12 11:50 PM      Component Value Range Status Comment   Specimen Description BLOOD LEFT HAND   Final    Special Requests BOTTLES DRAWN AEROBIC AND ANAEROBIC 2CC   Final    Culture  Setup Time 07/01/2012 05:07   Final    Culture NO GROWTH 5 DAYS   Final    Report Status 07/07/2012 FINAL   Final   URINE CULTURE     Status: Normal   Collection Time   07/01/12 12:15 AM      Component Value Range Status Comment   Specimen Description URINE, CATHETERIZED   Final    Special Requests NONE   Final    Culture  Setup Time 07/01/2012 07:34   Final    Colony Count >=100,000 COLONIES/ML   Final    Culture PROTEUS MIRABILIS   Final    Report Status 07/03/2012 FINAL   Final    Organism ID, Bacteria PROTEUS MIRABILIS   Final   URINE CULTURE     Status: Normal   Collection Time   07/01/12  5:45 AM  Component Value Range Status Comment   Specimen Description URINE, CATHETERIZED   Final    Special Requests NONE   Final    Culture  Setup Time  07/01/2012 09:04   Final    Colony Count 40,000 COLONIES/ML   Final    Culture     Final    Value: PROTEUS MIRABILIS     ENTEROCOCCUS SPECIES     PSEUDOMONAS AERUGINOSA   Report Status 07/06/2012 FINAL   Final    Organism ID, Bacteria PROTEUS MIRABILIS   Final    Organism ID, Bacteria ENTEROCOCCUS SPECIES   Final    Organism ID, Bacteria PSEUDOMONAS AERUGINOSA   Final   MRSA PCR SCREENING     Status: Abnormal   Collection Time   07/01/12 11:03 AM      Component Value Range Status Comment   MRSA by PCR POSITIVE (*) NEGATIVE Final      Labs: Basic Metabolic Panel:  Lab 07/08/12 1610 07/07/12 0845 07/06/12 1020 07/05/12 0440 07/04/12 0455 07/02/12 2030  NA 138 139 141 143 141 --  K 3.1* 3.4* 3.1* 2.7* 2.9* --  CL 100 100 104 106 107 --  CO2 27 26 28 28 28  --  GLUCOSE 114* 95 89 89 91 --  BUN 3* <3* 3* <3* <3* --  CREATININE 0.54 0.47* 0.57 0.55 0.56 --  CALCIUM 9.3 9.2 8.7 8.1* 8.1* --  MG -- -- 1.7 1.5 -- 1.3*  PHOS -- -- -- -- -- --   Liver Function Tests:  Lab 07/04/12 0455 07/03/12 0400 07/02/12 2030  AST 16 20 22   ALT 34 48 54*  ALKPHOS 43 46 47  BILITOT 0.5 0.6 0.6  PROT 5.4* 5.4* 5.6*  ALBUMIN 2.3* 2.5* 2.5*   No results found for this basename: LIPASE:5,AMYLASE:5 in the last 168 hours No results found for this basename: AMMONIA:5 in the last 168 hours CBC:  Lab 07/05/12 0440 07/04/12 0455 07/03/12 0400  WBC 6.6 6.8 9.4  NEUTROABS -- -- --  HGB 10.4* 10.4* 11.0*  HCT 29.5* 29.5* 30.4*  MCV 85.8 85.3 84.9  PLT 133* 125* 119*   Cardiac Enzymes: No results found for this basename: CKTOTAL:5,CKMB:5,CKMBINDEX:5,TROPONINI:5 in the last 168 hours BNP: BNP (last 3 results) No results found for this basename: PROBNP:3 in the last 8760 hours CBG:  Lab 07/09/12 1146 07/09/12 0750 07/08/12 2103 07/08/12 1640 07/08/12 1202  GLUCAP 103* 98 99 103* 94       Signed:  Camryn Quesinberry  [facility] 07/09/2012, 1:17 PM

## 2012-07-18 NOTE — Consult Note (Signed)
I have reviewed and discussed the care of this patient in detail with the nurse practitioner including pertinent patient records, physical exam findings and data. I agree with details of this encounter.  

## 2012-09-16 ENCOUNTER — Encounter: Payer: Self-pay | Admitting: Adult Health

## 2012-09-16 ENCOUNTER — Non-Acute Institutional Stay (SKILLED_NURSING_FACILITY): Payer: Medicare Other | Admitting: Adult Health

## 2012-09-16 DIAGNOSIS — I6350 Cerebral infarction due to unspecified occlusion or stenosis of unspecified cerebral artery: Secondary | ICD-10-CM

## 2012-09-16 DIAGNOSIS — I639 Cerebral infarction, unspecified: Secondary | ICD-10-CM

## 2012-09-16 DIAGNOSIS — R627 Adult failure to thrive: Secondary | ICD-10-CM

## 2012-09-16 DIAGNOSIS — F411 Generalized anxiety disorder: Secondary | ICD-10-CM

## 2012-09-16 DIAGNOSIS — R569 Unspecified convulsions: Secondary | ICD-10-CM

## 2012-09-16 NOTE — Progress Notes (Signed)
Subjective:     Patient ID: [name], male   DOB: 1946/02/08, 67 y.o.   MRN: [id number]  HPI he is being seen in management of his chronic illnesses. At this time he does not qualify for hospice care as his weight is stable he is eating well.  He has a history of seizure activity is not medications;  he is being followed for failure to thrive; he is presently stable his weight is stable he is eating he does spend all of his time in bed per his choice.  His anxiety is stable does have ativan 1 mg every 8 hours as needed he is not taking any routine medications He has a history of a cva for which he is not medications.  The goal of his care is comfort  Medical history reviewed Past Surgical History  Procedure Laterality Date  . Below knee leg amputation      ABove the knee AKA bil  . Vp shunt placement  1990  . Circumcision  2007  . Suprapubic catheter placement  2007   Current outpatient prescriptions:hyoscyamine (LEVSIN SL) 0.125 MG SL tablet, Place 0.125 mg under the tongue every 4 (four) hours as needed for cramping., Disp: , Rfl: ;  LORazepam (ATIVAN) 1 MG tablet, Take 1 tablet (1 mg total) by mouth every 8 (eight) hours., Disp: 30 tablet, Rfl: 0;  morphine (ROXANOL) 20 MG/ML concentrated solution, Take 0.25 mLs (5 mg total) by mouth every 2 (two) hours as needed., Disp: 30 mL, Rfl: 0 sennosides-docusate sodium (SENOKOT-S) 8.6-50 MG tablet, Take 2 tablets by mouth at bedtime as needed for constipation., Disp: , Rfl:  Review of Systems  Unable to perform ROS  Filed Vitals:   09/16/12 1303  BP: 110/69  Pulse: 70  Height: 3\' 6"  (1.067 m)  Weight: 127 lb (57.607 kg)       Objective:   Physical Exam  Constitutional: He appears well-developed and well-nourished.  Neck: Neck supple.  Cardiovascular: Normal rate, regular rhythm and normal heart sounds.   Pulmonary/Chest: Effort normal and breath sounds normal.  Abdominal: Soft. Bowel sounds are normal.  Musculoskeletal:  Has  full range of motion in upper extremities; has bilateral aka; spends his time in bed.   Neurological: He is alert.  He is alert to himself only   Skin: Skin is warm and dry.  Psychiatric: He has a normal mood and affect.      lab reviewed: 07-05-12: wbc 6.4; hgb 10.4; hct 29.5 mcv 858; plt 133 07-08-12: glucose 114; bun 3; creat 0.54; k+ 3.1 ;na++ 138  Assessment:      Seizure; ftt; anxiety; cva.  Plan:    Will continue his current regimen at this time; will have him followed by palliative care at this time; will continue comfort care; he is not a candidate at this time for hospice care. Will monitor his status and make further adjustments as indicated.

## 2012-09-22 ENCOUNTER — Encounter: Payer: Self-pay | Admitting: Adult Health

## 2012-11-11 ENCOUNTER — Non-Acute Institutional Stay (SKILLED_NURSING_FACILITY): Payer: Medicare Other | Admitting: Adult Health

## 2012-11-11 ENCOUNTER — Encounter: Payer: Self-pay | Admitting: Adult Health

## 2012-11-11 DIAGNOSIS — M25559 Pain in unspecified hip: Secondary | ICD-10-CM

## 2012-11-11 DIAGNOSIS — I10 Essential (primary) hypertension: Secondary | ICD-10-CM

## 2012-11-11 DIAGNOSIS — M959 Acquired deformity of musculoskeletal system, unspecified: Secondary | ICD-10-CM

## 2012-11-11 DIAGNOSIS — R569 Unspecified convulsions: Secondary | ICD-10-CM

## 2012-11-11 NOTE — Progress Notes (Signed)
Patient ID: [name], male   DOB: [birth year], 67 y.o.   MRN: [id number] Allergies  Allergen Reactions  . Heparin Other (See Comments)    Hx of HIT in 2007     Chief Complaint  Patient presents with  . Medical Managment of Chronic Issues    HPI:  He is being seen for management of chronic illnesses; he is without significant change over the past month. At this time he does not qualify for hospice care. He is unable to fully participate in the hpi or ros.   Past Medical History  Diagnosis Date  . Diabetes mellitus   . Orchitis and epididymitis in disease classified elsewhere   . Unspecified intestinal obstruction   . Unspecified osteomyelitis, site unspecified   . Encounter for long-term (current) use of other medications   . Hypertrophy of prostate without urinary obstruction and other lower urinary tract symptoms (LUTS)   . Depressive disorder, not elsewhere classified   . Edema of penis   . Pulmonary congestion and hypostasis   . Other ascites   . Thrombocytopenia   . Urinary tract infection, site not specified   . Chronic ulcer of other specified site   . Acute, but ill-defined, cerebrovascular disease   . Intestinal infection due to E. coli, unspecified   . Other convulsions   . Unspecified essential hypertension   . Urinary tract infection, site not specified   . Acute upper respiratory infections of unspecified site   . Other chronic pain   . Unspecified psychosis   . Unspecified constipation   . Acute kidney failure, unspecified     Past Surgical History  Procedure Laterality Date  . Below knee leg amputation      ABove the knee AKA bil  . Vp shunt placement  1990  . Circumcision  2007  . Suprapubic catheter placement  2007    VITAL SIGNS BP 112/65  Pulse 70  Ht 3\' 6"  (1.067 m)  Wt 127 lb (57.607 kg)  BMI 50.6 kg/m2   Patient's Medications  New Prescriptions   No medications on file  Previous Medications   HYOSCYAMINE (LEVSIN SL) 0.125 MG SL  TABLET    Place 0.125 mg under the tongue every 4 (four) hours as needed for cramping.   LORAZEPAM (ATIVAN) 1 MG TABLET    Take 1 tablet (1 mg total) by mouth every 8 (eight) hours.   MORPHINE (ROXANOL) 20 MG/ML CONCENTRATED SOLUTION    Take 0.25 mLs (5 mg total) by mouth every 2 (two) hours as needed.   PROPYLENE GLYCOL (SYSTANE BALANCE OP)    Apply 1 drop to eye 2 (two) times daily. To both eyes   SENNOSIDES-DOCUSATE SODIUM (SENOKOT-S) 8.6-50 MG TABLET    Take 2 tablets by mouth at bedtime as needed for constipation.  Modified Medications   No medications on file  Discontinued Medications   No medications on file    SIGNIFICANT DIAGNOSTIC EXAMS  04-04-12: ct of abdomen and pelvis: Right-sided hydroureternephrosis and delayed excretion. The bladder is circumferentially thickened. Findings suggest chronic cystitis with ascending right-sided infection. A distal right ureteral stricture is also a consideration. Large distal colonic stool burden. Stercoral colitis not excluded. Sacral decubitus ulcers and sclerotic appearance to the posterior aspect of the sacrum appear similar to prior. Small bilateral pleural effusions with associated consolidations; atelectasis versus infiltrate 04-25-12: kub: No evidence of active pulmonary disease. The bowel gas pattern suggests ileus. No evidence of obstruction.  06-30-12: chest x-ray: patchy non-consolidation  interstitial changes left lower lung consistent with pneumonia  07-01-12: chest x-ray: Streaky left lung base opacity. Cannot exclude developing left lung base pneumonia in this setting. Otherwise stable chest. 07-01-12: renal ultrasound: 1. The appearance of the liver suggests mild hepatitis. 2. Cholelithiasis; gallbladder otherwise unremarkable in appearance. 3. Mild right renal cortical thinning noted; this may reflect chronic renal disease.  07-01-12: chest x-ray: 1. Right IJ line noted ending about the proximal to mid SVC. 2. Mild bibasilar airspace  opacities, and mild left midlung opacity, may reflect atelectasis or pneumonia. 07-01-12: ct of head: 1. Stable CT appearance of the brain since 2009. The ventricular shunt catheter is stable and ventricular size is unchanged. Stable periventricular white matter disease. 2. No acute intracranial findings or mass lesion. 07-05-12: chest x-ray: 1. Increased left lower lobe infiltrate versus atelectasis. 2. Stable mild right lower lung opacity.       Component Value Date/Time   ALBUMIN 2.3* 07/04/2012 0455   AST 16 07/04/2012 0455   ALT 34 07/04/2012 0455   ALKPHOS 43 07/04/2012 0455   BILITOT 0.5 07/04/2012 0455       Component Value Date/Time   BUN 3* 07/08/2012 0500   GLUCOSE 114* 07/08/2012 0500   CREATININE 0.54 07/08/2012 0500   K 3.1* 07/08/2012 0500   NA 138 07/08/2012 0500       Component Value Date/Time   WBC 6.6 07/05/2012 0440   RBC 3.44* 07/05/2012 0440   HGB 10.4* 07/05/2012 0440   HCT 29.5* 07/05/2012 0440   PLT 133* 07/05/2012 0440   MCV 85.8 07/05/2012 0440     Review of Systems  Unable to perform ROS    Physical Exam  Constitutional: He appears well-developed and well-nourished.  Neck: Neck supple.  Cardiovascular: Normal rate and regular rhythm.   Respiratory: Effort normal and breath sounds normal.  GI: Soft. Bowel sounds are normal.  Musculoskeletal: Normal range of motion.  Bilateral aka  Neurological: He is alert.  Oriented to self  Skin: Skin is warm and dry.  Psychiatric: He has a normal mood and affect.       ASSESSMENT/ PLAN:   HYPERTENSION, BENIGN SYSTEMIC He is presently stable will not make changes will monitor his status   CEREBROVAS DIS, LATE EFFECTS (S/P CVA) He is without change in status will continue to monitor his status   CONVULSIONS, SEIZURES, NOS He is not on medications; will monitor his status   HIP PAINFUL, ARTHRALGIA Is stable has roxanol 5 mg every 2 hours as needed will not make changes

## 2012-11-11 NOTE — Assessment & Plan Note (Signed)
He is without change in status will continue to monitor his status

## 2012-11-11 NOTE — Assessment & Plan Note (Signed)
He is not on medications; will monitor his status

## 2012-11-11 NOTE — Assessment & Plan Note (Signed)
Is stable has roxanol 5 mg every 2 hours as needed will not make changes

## 2012-11-11 NOTE — Assessment & Plan Note (Signed)
He is presently stable will not make changes will monitor his status

## 2013-01-07 ENCOUNTER — Other Ambulatory Visit: Payer: Self-pay | Admitting: Geriatric Medicine

## 2013-01-07 MED ORDER — LORAZEPAM 1 MG PO TABS: 1.0000 mg | ORAL_TABLET | Freq: Three times a day (TID) | ORAL | Status: DC

## 2013-01-14 ENCOUNTER — Non-Acute Institutional Stay (SKILLED_NURSING_FACILITY): Payer: Medicare Other | Admitting: Internal Medicine

## 2013-01-14 DIAGNOSIS — Z9359 Other cystostomy status: Secondary | ICD-10-CM

## 2013-01-14 DIAGNOSIS — R569 Unspecified convulsions: Secondary | ICD-10-CM

## 2013-01-14 DIAGNOSIS — R627 Adult failure to thrive: Secondary | ICD-10-CM

## 2013-01-14 DIAGNOSIS — R339 Retention of urine, unspecified: Secondary | ICD-10-CM

## 2013-01-14 DIAGNOSIS — M62838 Other muscle spasm: Secondary | ICD-10-CM

## 2013-01-14 DIAGNOSIS — I639 Cerebral infarction, unspecified: Secondary | ICD-10-CM

## 2013-01-14 DIAGNOSIS — I6350 Cerebral infarction due to unspecified occlusion or stenosis of unspecified cerebral artery: Secondary | ICD-10-CM

## 2013-01-14 DIAGNOSIS — I10 Essential (primary) hypertension: Secondary | ICD-10-CM

## 2013-01-14 NOTE — Progress Notes (Signed)
Patient ID: [name], male   DOB: 18-Feb-1946, 67 y.o.   MRN: [id number] Location:  [facility] Provider:  Gwenith Spitz. Renato Gails, D.O., C.M.D.  Code Status: DNR  Chief Complaint: left shoulder pain  HPI:  67 yo male with h/o prior subdural hematomas, osteomyelitis s/p bilateral AKAs, neurogenic bladder with urinary retention, hyperlipidemia, htn, BPH was seen for an acute visit due to left shoulder pain.  He notes it comes and goes.  His ROM is limited.    Review of Systems:  Review of Systems  Constitutional: Negative for fever and chills.  Eyes: Negative for blurred vision.  Respiratory: Negative for shortness of breath.   Cardiovascular: Negative for chest pain.  Gastrointestinal: Negative for abdominal pain.  Genitourinary: Negative for dysuria.       Suprapubic catheter in place  Musculoskeletal: Positive for joint pain.       Left shoulder; has bilateral AKAs  Neurological: Negative for headaches.  Psychiatric/Behavioral: Positive for memory loss.     Medications: Patient's Medications  New Prescriptions   No medications on file  Previous Medications   HYOSCYAMINE (LEVSIN SL) 0.125 MG SL TABLET    Place 0.125 mg under the tongue every 4 (four) hours as needed for cramping.   LORAZEPAM (ATIVAN) 1 MG TABLET    Take 1 tablet (1 mg total) by mouth every 8 (eight) hours.   MORPHINE (ROXANOL) 20 MG/ML CONCENTRATED SOLUTION    Take 0.25 mLs (5 mg total) by mouth every 2 (two) hours as needed.   PROPYLENE GLYCOL (SYSTANE BALANCE OP)    Apply 1 drop to eye 2 (two) times daily. To both eyes   SENNOSIDES-DOCUSATE SODIUM (SENOKOT-S) 8.6-50 MG TABLET    Take 2 tablets by mouth at bedtime as needed for constipation.  Modified Medications   No medications on file  Discontinued Medications   No medications on file    Physical Exam: VS:  T 97.4 HR 70 RR 18 BP 104/58 Physical Exam  Constitutional: No distress.  Cardiovascular: Normal rate, regular rhythm and normal  heart sounds.   Pulmonary/Chest: Effort normal and breath sounds normal. No respiratory distress.  Abdominal: Soft. Bowel sounds are normal. He exhibits no distension. There is no tenderness.  Genitourinary:  Suprapubic catheter in place draining yellow urine  Musculoskeletal: Normal range of motion.  Of upper extremities;  Has bilateral bka  Neurological: He is alert.  Skin:  Seborrheic dermatitis  Psychiatric: He has a normal mood and affect.     Labs reviewed: Basic Metabolic Panel:  Recent Labs  40/98/11 2030  07/05/12 0440 07/06/12 1020 07/07/12 0845 07/08/12 0500  NA 143  < > 143 141 139 138  K 2.8*  < > 2.7* 3.1* 3.4* 3.1*  CL 110  < > 106 104 100 100  CO2 25  < > 28 28 26 27   GLUCOSE 104*  < > 89 89 95 114*  BUN 4*  < > <3* 3* <3* 3*  CREATININE 0.57  < > 0.55 0.57 0.47* 0.54  CALCIUM 7.9*  < > 8.1* 8.7 9.2 9.3  MG 1.3*  --  1.5 1.7  --   --   < > = values in this interval not displayed.  Liver Function Tests:  Recent Labs  07/02/12 2030 07/03/12 0400 07/04/12 0455  AST 22 20 16   ALT 54* 48 34  ALKPHOS 47 46 43  BILITOT 0.6 0.6 0.5  PROT 5.6* 5.4* 5.4*  ALBUMIN 2.5* 2.5* 2.3*  CBC:  Recent Labs  04/03/12 2045  04/25/12 2053 06/30/12 2335  07/03/12 0400 07/04/12 0455 07/05/12 0440  WBC 9.8  --  6.3 12.7*  < > 9.4 6.8 6.6  NEUTROABS 5.6  --  2.6 9.9*  --   --   --   --   HGB 13.8  < > 13.8 15.4  < > 11.0* 10.4* 10.4*  HCT 39.7  < > 39.9 44.6  < > 30.4* 29.5* 29.5*  MCV 86.7  --  86.2 87.5  < > 84.9 85.3 85.8  PLT 168  --  192 185  < > 119* 125* 133*  < > = values in this interval not displayed.   Assessment/Plan 1. Muscle spasm of left shoulder -will treat short term with muscle relaxants for pain control  2. Essential hypertension, benign -bp at goal with current therapy  3. CVA (cerebral infarction) -previously, is able to move upper extremities, contributes some to history with head nodding  4. Failure to thrive in adult -has  stabilized now since last episode of decline, but overall prognosis poor, palliative care involved  5. Seizures -no recent seizure activity, cont to monitor med levels  6. Urinary retention with incomplete bladder emptying -continues with suprapubic catheter for drainage  7. Presence of suprapubic catheter -gets changed regularly

## 2013-01-21 ENCOUNTER — Emergency Department (HOSPITAL_COMMUNITY): Payer: Medicare Other

## 2013-01-21 ENCOUNTER — Encounter (HOSPITAL_COMMUNITY): Payer: Self-pay | Admitting: Emergency Medicine

## 2013-01-21 ENCOUNTER — Emergency Department (HOSPITAL_COMMUNITY)
Admission: EM | Admit: 2013-01-21 | Discharge: 2013-01-21 | Disposition: A | Discharge status: Home / Self Care | Payer: Medicare Other | Attending: Emergency Medicine | Admitting: Emergency Medicine

## 2013-01-21 DIAGNOSIS — Z982 Presence of cerebrospinal fluid drainage device: Secondary | ICD-10-CM | POA: Insufficient documentation

## 2013-01-21 DIAGNOSIS — G8929 Other chronic pain: Secondary | ICD-10-CM | POA: Insufficient documentation

## 2013-01-21 DIAGNOSIS — Z8719 Personal history of other diseases of the digestive system: Secondary | ICD-10-CM | POA: Insufficient documentation

## 2013-01-21 DIAGNOSIS — T83091A Other mechanical complication of indwelling urethral catheter, initial encounter: Secondary | ICD-10-CM | POA: Insufficient documentation

## 2013-01-21 DIAGNOSIS — E119 Type 2 diabetes mellitus without complications: Secondary | ICD-10-CM | POA: Insufficient documentation

## 2013-01-21 DIAGNOSIS — Z87891 Personal history of nicotine dependence: Secondary | ICD-10-CM | POA: Insufficient documentation

## 2013-01-21 DIAGNOSIS — S88119A Complete traumatic amputation at level between knee and ankle, unspecified lower leg, initial encounter: Secondary | ICD-10-CM | POA: Insufficient documentation

## 2013-01-21 DIAGNOSIS — Z8659 Personal history of other mental and behavioral disorders: Secondary | ICD-10-CM | POA: Insufficient documentation

## 2013-01-21 DIAGNOSIS — Z8619 Personal history of other infectious and parasitic diseases: Secondary | ICD-10-CM | POA: Insufficient documentation

## 2013-01-21 DIAGNOSIS — Z466 Encounter for fitting and adjustment of urinary device: Secondary | ICD-10-CM

## 2013-01-21 DIAGNOSIS — Z872 Personal history of diseases of the skin and subcutaneous tissue: Secondary | ICD-10-CM | POA: Insufficient documentation

## 2013-01-21 DIAGNOSIS — Z8709 Personal history of other diseases of the respiratory system: Secondary | ICD-10-CM | POA: Insufficient documentation

## 2013-01-21 DIAGNOSIS — Z79899 Other long term (current) drug therapy: Secondary | ICD-10-CM | POA: Insufficient documentation

## 2013-01-21 DIAGNOSIS — Z8679 Personal history of other diseases of the circulatory system: Secondary | ICD-10-CM | POA: Insufficient documentation

## 2013-01-21 DIAGNOSIS — Z8739 Personal history of other diseases of the musculoskeletal system and connective tissue: Secondary | ICD-10-CM | POA: Insufficient documentation

## 2013-01-21 DIAGNOSIS — Z87798 Personal history of other (corrected) congenital malformations: Secondary | ICD-10-CM | POA: Insufficient documentation

## 2013-01-21 DIAGNOSIS — Y846 Urinary catheterization as the cause of abnormal reaction of the patient, or of later complication, without mention of misadventure at the time of the procedure: Secondary | ICD-10-CM | POA: Insufficient documentation

## 2013-01-21 DIAGNOSIS — Z862 Personal history of diseases of the blood and blood-forming organs and certain disorders involving the immune mechanism: Secondary | ICD-10-CM | POA: Insufficient documentation

## 2013-01-21 DIAGNOSIS — Z87448 Personal history of other diseases of urinary system: Secondary | ICD-10-CM | POA: Insufficient documentation

## 2013-01-21 DIAGNOSIS — Z87440 Personal history of urinary (tract) infections: Secondary | ICD-10-CM | POA: Insufficient documentation

## 2013-01-21 DIAGNOSIS — I10 Essential (primary) hypertension: Secondary | ICD-10-CM | POA: Insufficient documentation

## 2013-01-21 MED ORDER — ACETAMINOPHEN 325 MG PO TABS: 650.0000 mg | ORAL_TABLET | Freq: Once | ORAL | Status: DC

## 2013-01-21 MED ORDER — IOHEXOL 300 MG/ML  SOLN
45.0000 mL | Freq: Once | INTRAMUSCULAR | Status: AC | PRN
  Administered 2013-01-21: 45 mL

## 2013-01-21 MED ORDER — IOHEXOL 300 MG/ML  SOLN
35.0000 mL | Freq: Once | INTRAMUSCULAR | Status: AC | PRN
  Administered 2013-01-21: 35 mL

## 2013-01-21 NOTE — ED Provider Notes (Signed)
History    CSN: [id number] Arrival date & time 01/21/13  1608  First MD Initiated Contact with Patient 01/21/13 1617     Chief Complaint  Patient presents with  . Foley Catheter pulled out    (Consider location/radiation/quality/duration/timing/severity/associated sxs/prior Treatment) HPI Comments: 67 yo male with smoking, hcap, uti, NH residence, chronic foley/ suprapubic catheter presents after he removed his suprapubic catheter at unknown time period.  Pt at baseline mentally, no fevers or other concerns from NH, no family at bedside.  Unable to obtain details from pt due to mental status/ cva hx.    The history is provided by the nursing home.   Past Medical History  Diagnosis Date  . Diabetes mellitus   . Orchitis and epididymitis in disease classified elsewhere   . Unspecified intestinal obstruction   . Unspecified osteomyelitis, site unspecified   . Encounter for long-term (current) use of other medications   . Hypertrophy of prostate without urinary obstruction and other lower urinary tract symptoms (LUTS)   . Depressive disorder, not elsewhere classified   . Edema of penis   . Pulmonary congestion and hypostasis   . Other ascites   . Thrombocytopenia   . Urinary tract infection, site not specified   . Chronic ulcer of other specified site   . Acute, but ill-defined, cerebrovascular disease   . Intestinal infection due to E. coli, unspecified   . Other convulsions   . Unspecified essential hypertension   . Urinary tract infection, site not specified   . Acute upper respiratory infections of unspecified site   . Other chronic pain   . Unspecified psychosis   . Unspecified constipation   . Acute kidney failure, unspecified    Past Surgical History  Procedure Laterality Date  . Below knee leg amputation      ABove the knee AKA bil  . Vp shunt placement  1990  . Circumcision  2007  . Suprapubic catheter placement  2007   History reviewed. No pertinent family  history. History  Substance Use Topics  . Smoking status: Former Smoker    Types: Cigarettes  . Smokeless tobacco: Never Used  . Alcohol Use: No     Comment: quit alcohol in 1999    Review of Systems  Unable to perform ROS: Other    Allergies  Heparin  Home Medications   Current Outpatient Rx  Name  Route  Sig  Dispense  Refill  . LORazepam (ATIVAN) 1 MG tablet   Oral   Take 1 tablet (1 mg total) by mouth every 8 (eight) hours.   90 tablet   5   . methocarbamol (ROBAXIN) 500 MG tablet   Oral   Take 1,000 mg by mouth 4 (four) times daily.         . hyoscyamine (LEVSIN SL) 0.125 MG SL tablet   Sublingual   Place 0.125 mg under the tongue every 4 (four) hours as needed for cramping.         . morphine (ROXANOL) 20 MG/ML concentrated solution   Oral   Take 0.25 mLs (5 mg total) by mouth every 2 (two) hours as needed.   30 mL   0   . Propylene Glycol (SYSTANE BALANCE OP)   Ophthalmic   Apply 1 drop to eye 2 (two) times daily. To both eyes         . sennosides-docusate sodium (SENOKOT-S) 8.6-50 MG tablet   Oral   Take 2 tablets by mouth at bedtime  as needed for constipation.          BP 113/56  Pulse 76  Temp(Src) 97.7 F (36.5 C) (Oral)  Resp 14  SpO2 98% Physical Exam  Nursing note and vitals reviewed. Constitutional: He appears well-nourished.  HENT:  Head: Normocephalic and atraumatic.  Eyes: Right eye exhibits no discharge. Left eye exhibits no discharge.  Neck: Normal range of motion. Neck supple. No tracheal deviation present.  Cardiovascular: Normal rate and regular rhythm.   Pulmonary/Chest: Effort normal.  Abdominal: Soft. He exhibits no distension. There is no tenderness. There is no guarding.  Urine leaking from suprapubic catheter  Musculoskeletal: He exhibits no edema.  Below hip amputations bilateral  Neurological: He is alert.  minimal verbal, alert to voice  Skin: Skin is warm. No rash noted.    ED Course  Procedures  (including critical care time) Suprapubic catheter placement attempt by myself in lower abdominal wall.  16 coude.   Area sterilized prior to procedure.  Xray showed in soft tissue. Removed  Foley catheter placed by myself, mild difficulty. 16 F.  Sterilized prior.   Placement confirmed. Labs Reviewed - No data to display Dg Elbow Complete Left  01/21/2013   *RADIOLOGY REPORT*  Clinical Data: Pain.  LEFT ELBOW - COMPLETE 3+ VIEW  Comparison: None.  Findings: A tiny bone fragment is evident adjacent to the radial head on the first image.  There is no significant joint effusion. The elbow is located.  No other fracture is seen.  Mild soft tissue swelling is present over the elbow. Moderate generalized osteopenia is present.  IMPRESSION:  1.  Possible avulsion fracture along the radial head.  There is no significant joint effusion to substantiate the fracture. 2.  No other acute abnormality. 3.  Moderate osteopenia.   Original Report Authenticated By: Marin Roberts, M.D.   Dg Wrist Complete Left  01/21/2013   *RADIOLOGY REPORT*  Clinical Data: Left to elbow and wrist pain.  The patient is unresponsive but cries evident pain when the left upper extremity is mobilized.  LEFT WRIST - COMPLETE 3+ VIEW  Comparison: None.  Findings: Moderate osteopenia is evident.  The wrist is located. No acute bone or soft tissue abnormalities are present. Degenerative changes are evident at the STT joint and the first MCP joints.  IMPRESSION:  1.  No acute abnormality. 2.  Moderate to generalized osteopenia. 3.  Mild degenerative changes within the wrist.   Original Report Authenticated By: Marin Roberts, M.D.   Dg Abd 1 View  01/21/2013   *RADIOLOGY REPORT*  Clinical Data: Evaluate suprapubic catheter.  ABDOMEN - 1 VIEW  Comparison: None.  Findings: Injected contrast is outside the urinary bladder within the adjacent soft tissue.  This was discussed with Dr. Jodi Mourning at the time of imaging.  Cross-table lateral  view not obtained. Catheter is to be removed.  Osteopenic bones.  Vascular calcifications.  Moderate stool.  IMPRESSION: Suprapubic catheter is not within the bladder.  Contrast extends into adjacent soft tissue (exact position indeterminate).   Original Report Authenticated By: Lacy Duverney, M.D.   No diagnosis found.  MDM  Suprapubic catheter attempt/ procedure done by myself to attempt and preserve site using 16 g coude catheter aftuer unable to pass 14 f.   Spoke with radiology, foley in soft tissue.  Removed. Spoke with radiology Dr Annabell Howells, recommended placement of normal foley through hypospadius.   Nurse unable to pass.   I was able to pass 16 french with some difficulty/ adjustments.  Xray repeated to check placement.  Pt comfortable, at baseline on multiple rechecks.   Repaged Dr Annabell Howells, Herby Abraham reviewed, foley in place.  Urology will see pt in the next week to discuss replacement. Dg Elbow Complete Left  01/21/2013   *RADIOLOGY REPORT*  Clinical Data: Pain.  LEFT ELBOW - COMPLETE 3+ VIEW  Comparison: None.  Findings: A tiny bone fragment is evident adjacent to the radial head on the first image.  There is no significant joint effusion. The elbow is located.  No other fracture is seen.  Mild soft tissue swelling is present over the elbow. Moderate generalized osteopenia is present.  IMPRESSION:  1.  Possible avulsion fracture along the radial head.  There is no significant joint effusion to substantiate the fracture. 2.  No other acute abnormality. 3.  Moderate osteopenia.   Original Report Authenticated By: Marin Roberts, M.D.   Dg Wrist Complete Left  01/21/2013   *RADIOLOGY REPORT*  Clinical Data: Left to elbow and wrist pain.  The patient is unresponsive but cries evident pain when the left upper extremity is mobilized.  LEFT WRIST - COMPLETE 3+ VIEW  Comparison: None.  Findings: Moderate osteopenia is evident.  The wrist is located. No acute bone or soft tissue abnormalities are  present. Degenerative changes are evident at the STT joint and the first MCP joints.  IMPRESSION:  1.  No acute abnormality. 2.  Moderate to generalized osteopenia. 3.  Mild degenerative changes within the wrist.   Original Report Authenticated By: Marin Roberts, M.D.   Dg Abd 1 View  01/21/2013   *RADIOLOGY REPORT*  Clinical Data: Foley catheter placement.  ABDOMEN - 1 VIEW  Comparison: One-view pelvis of the same day.  Findings: Previously seen contrast within the extracranial soft tissues is again noted.  Contrast is present within the urinary bladder and urethra.  IMPRESSION: The Foley catheter is within the urinary bladder.   Original Report Authenticated By: Marin Roberts, M.D.   Dg Abd 1 View  01/21/2013   *RADIOLOGY REPORT*  Clinical Data: Evaluate suprapubic catheter.  ABDOMEN - 1 VIEW  Comparison: None.  Findings: Injected contrast is outside the urinary bladder within the adjacent soft tissue.  This was discussed with Dr. Jodi Mourning at the time of imaging.  Cross-table lateral view not obtained. Catheter is to be removed.  Osteopenic bones.  Vascular calcifications.  Moderate stool.  IMPRESSION: Suprapubic catheter is not within the bladder.  Contrast extends into adjacent soft tissue (exact position indeterminate).   Original Report Authenticated By: Lacy Duverney, M.D.     Enid Skeens, MD 01/21/13 2150

## 2013-01-21 NOTE — Progress Notes (Signed)
CSW spoke with pt at bedside.  Pt was oriented to person.  CSW attempted to confirm that pt was a resident at [facility] but was unsure if pt understood question.  Pt attempted to talk to CSW but CSW was unable to understand pt.  No family was available at bedside to provide additional information. CSW confirmed that pt was a resident from [facility] per chart.    Marland KitchenKarle Plumber  [phone] 01/21/13 9:44 pm

## 2013-01-21 NOTE — ED Notes (Signed)
[facility] called for transport back to [facility].

## 2013-01-21 NOTE — ED Notes (Signed)
[id number]<JY> Expected date:<BR> Expected time:<BR> Means of arrival:<BR> Comments:<BR> EMS-foley pulled out

## 2013-01-21 NOTE — ED Notes (Signed)
Per EMS: Pt from [facility]. Pt alert, not oriented to time or situation. Pt here because he pulled out his Foley catheter and needs it replaced. Pt was impacted, enema done at nursing home just prior to EMS arrival. Pt denies pain but cries out when left arm moved.

## 2013-01-27 ENCOUNTER — Encounter: Payer: Self-pay | Admitting: *Deleted

## 2013-03-17 ENCOUNTER — Encounter: Payer: Self-pay | Admitting: Nurse Practitioner

## 2013-03-17 ENCOUNTER — Non-Acute Institutional Stay (SKILLED_NURSING_FACILITY): Payer: Medicare Other | Admitting: Nurse Practitioner

## 2013-03-17 DIAGNOSIS — N400 Enlarged prostate without lower urinary tract symptoms: Secondary | ICD-10-CM

## 2013-03-17 DIAGNOSIS — I10 Essential (primary) hypertension: Secondary | ICD-10-CM

## 2013-03-17 DIAGNOSIS — R627 Adult failure to thrive: Secondary | ICD-10-CM

## 2013-03-17 DIAGNOSIS — R569 Unspecified convulsions: Secondary | ICD-10-CM

## 2013-03-17 DIAGNOSIS — M959 Acquired deformity of musculoskeletal system, unspecified: Secondary | ICD-10-CM

## 2013-03-17 NOTE — Progress Notes (Signed)
Patient ID: [name], male   DOB: 1945-11-18, 67 y.o.   MRN: [id number]  Nursing Home Location:  [facility]   Place of Service: SNF [phone])  Chief Complaint  Patient presents with  . Medical Managment of Chronic Issues    HPI:  67 year old male who is being seen today for routine follow up of his chronic conditions. Pt does not qualify for hospice care but main goal is comfort  nursing does not have any concerns at this time.  He has a history of seizures but not currently on  medications and no recent reports of seizures;  Hx of failure to thrive- weight is currently stable Anxiety-stable does have ativan 1 mg every 8 hours as needed he is not taking any routine medications  Review of Systems:  Unable to obtain Medications: Patient's Medications  New Prescriptions   No medications on file  Previous Medications   HYOSCYAMINE (LEVSIN SL) 0.125 MG SL TABLET    Place 0.125 mg under the tongue every 4 (four) hours as needed for cramping.   LORAZEPAM (ATIVAN) 1 MG TABLET    Take 1 tablet (1 mg total) by mouth every 8 (eight) hours.   METHOCARBAMOL (ROBAXIN) 500 MG TABLET    Take 1,000 mg by mouth 4 (four) times daily.   MORPHINE (ROXANOL) 20 MG/ML CONCENTRATED SOLUTION    Take 0.25 mLs (5 mg total) by mouth every 2 (two) hours as needed.   PROPYLENE GLYCOL (SYSTANE BALANCE OP)    Apply 1 drop to eye every 12 (twelve) hours. To both eyes   SENNOSIDES-DOCUSATE SODIUM (SENOKOT-S) 8.6-50 MG TABLET    Take 2 tablets by mouth at bedtime as needed for constipation.  Modified Medications   No medications on file  Discontinued Medications   No medications on file     Physical Exam:  Filed Vitals:   03/17/13 2149  BP: 122/85  Pulse: 74  Temp: 97.2 F (36.2 C)  Resp: 18     GENERAL APPEARANCE: NAD resting in bed   SKIN: No diaphoresis rash, or wounds HEAD: Normocephalic, atraumatic  EYES: Conjunctiva/lids clear. Pupils round, reactive. EOMs intact.  EARS:  External exam WNL, canals clear. Hearing grossly normal.  NOSE: No deformity or discharge.  MOUTH/THROAT: Lips w/o lesions. Mouth and throat normal. Tongue moist, w/o lesion.  NECK: No thyroid tenderness, enlargement or nodule  RESPIRATORY: Breathing is even, unlabored. Lung sounds are clear   CARDIOVASCULAR: Heart RRR no murmurs, rubs or gallops.  GASTROINTESTINAL: Abdomen is soft, non-tender, not distended w/ normal bowel sounds.  GENITOURINARY: Bladder non tender, not distended - suprapubic cath- staff changes monthly MUSCULOSKELETAL: bilateral AKA NEUROLOGIC: Moves upper extremities  PSYCHIATRIC: Mood and affect appropriate to situation, no behavioral issues  Labs reviewed/Significant Diagnostic Results: 07-05-12: wbc 6.4; hgb 10.4; hct 29.5 mcv 858; plt 133 07-08-12: glucose 114; bun 3; creat 0.54; k+ 3.1 ;na++ 138     Assessment/Plan 1. HYPERTENSION, BENIGN SYSTEMIC Stable not on any medications  2. Adult failure to thrive Good appetite; weight is stable at this time  3. CONVULSIONS, SEIZURES, NOS No noted seizures; PRN ativian   4. Hyperplasia of prostate conts with suprapubic cath- changed monthly by nursing  5. CEREBROVAS DIS, LATE EFFECTS (S/P CVA) stable

## 2013-04-22 ENCOUNTER — Non-Acute Institutional Stay (SKILLED_NURSING_FACILITY): Payer: Medicare Other | Admitting: Internal Medicine

## 2013-04-22 DIAGNOSIS — R627 Adult failure to thrive: Secondary | ICD-10-CM

## 2013-04-22 DIAGNOSIS — R634 Abnormal weight loss: Secondary | ICD-10-CM

## 2013-04-22 DIAGNOSIS — I6350 Cerebral infarction due to unspecified occlusion or stenosis of unspecified cerebral artery: Secondary | ICD-10-CM

## 2013-04-22 DIAGNOSIS — S78119A Complete traumatic amputation at level between unspecified hip and knee, initial encounter: Secondary | ICD-10-CM

## 2013-04-22 DIAGNOSIS — Z89611 Acquired absence of right leg above knee: Secondary | ICD-10-CM

## 2013-04-22 DIAGNOSIS — I639 Cerebral infarction, unspecified: Secondary | ICD-10-CM

## 2013-04-22 DIAGNOSIS — M25559 Pain in unspecified hip: Secondary | ICD-10-CM

## 2013-04-22 DIAGNOSIS — R569 Unspecified convulsions: Secondary | ICD-10-CM

## 2013-04-22 DIAGNOSIS — E46 Unspecified protein-calorie malnutrition: Secondary | ICD-10-CM

## 2013-04-23 ENCOUNTER — Other Ambulatory Visit: Payer: Self-pay | Admitting: *Deleted

## 2013-04-23 MED ORDER — LORAZEPAM 1 MG PO TABS: ORAL_TABLET | ORAL | Status: DC

## 2013-04-23 NOTE — Telephone Encounter (Signed)
rx refilled per protocol  

## 2013-05-01 NOTE — Progress Notes (Signed)
Patient ID: [name], male   DOB: 1946-03-22, 67 y.o.   MRN: [id number] Location:  [facility] Provider:  Gwenith Spitz. Renato Gails, D.O., C.M.D.  Code Status:  DNR, palliative care--hospice just reordered   Chief Complaint  Patient presents with  . Acute Visit    lethargy, generalized decline, poor po intake, pocketing food    HPI:  67 yo male with h/o stroke with dementia and urinary retention with suprapubic catheter and recurrent UTIs, PAD s/p bilateral AKAs, constipation and seizure disorder seen for acute visit due to lethargy, generalized decline, poor po intake, pocketing of food.  He has been refusing his meals.  This is his second bout of severe decline in the past year.  He has lost 9lbs in 4 mos.  (June 142 lbs, Oct 133 lbs).  Chocolate milk was added to his trays.    Review of Systems:  Review of Systems  Constitutional: Positive for weight loss and malaise/fatigue. Negative for fever and chills.  HENT: Negative for congestion.   Eyes: Negative for blurred vision.  Respiratory: Negative for cough and shortness of breath.   Cardiovascular: Negative for chest pain.  Gastrointestinal: Positive for constipation.  Genitourinary:       Suprapubic catheter in place  Musculoskeletal: Negative for myalgias.  Skin: Negative for rash.  Psychiatric/Behavioral: Positive for memory loss. Negative for depression.    Medications: Patient's Medications  New Prescriptions   No medications on file  Previous Medications   HYOSCYAMINE (LEVSIN SL) 0.125 MG SL TABLET    Place 0.125 mg under the tongue every 4 (four) hours as needed for cramping.   LORAZEPAM (ATIVAN) 1 MG TABLET    Take 1 tablet by mouth every 8 hours as need for seizures.   METHOCARBAMOL (ROBAXIN) 500 MG TABLET    Take 1,000 mg by mouth 4 (four) times daily.   MORPHINE (ROXANOL) 20 MG/ML CONCENTRATED SOLUTION    Take 0.25 mLs (5 mg total) by mouth every 2 (two) hours as needed.   PROPYLENE GLYCOL (SYSTANE  BALANCE OP)    Apply 1 drop to eye every 12 (twelve) hours. To both eyes   SENNOSIDES-DOCUSATE SODIUM (SENOKOT-S) 8.6-50 MG TABLET    Take 2 tablets by mouth at bedtime as needed for constipation.  Modified Medications   No medications on file  Discontinued Medications   No medications on file    Physical Exam: Physical Exam  Nursing note and vitals reviewed. Constitutional:  Thin black male  HENT:  Head: Normocephalic and atraumatic.  Eyes: Pupils are equal, round, and reactive to light.  Neck: Neck supple.  Cardiovascular: Normal rate, regular rhythm, normal heart sounds and intact distal pulses.   Pulmonary/Chest: Effort normal and breath sounds normal. No respiratory distress.  Abdominal: Soft. Bowel sounds are normal. He exhibits no distension. There is no tenderness.  Genitourinary:  Suprapubic catheter in place with dark yellow urine  Musculoskeletal: Normal range of motion. He exhibits no edema and no tenderness.  Bilateral AKA  Neurological: He is alert.  Not lethargic when seen  Skin: Skin is warm and dry.  Psychiatric: He has a normal mood and affect.   Labs reviewed: Basic Metabolic Panel:  Recent Labs  86/57/84 2030  07/05/12 0440 07/06/12 1020 07/07/12 0845 07/08/12 0500  NA 143  < > 143 141 139 138  K 2.8*  < > 2.7* 3.1* 3.4* 3.1*  CL 110  < > 106 104 100 100  CO2 25  < > 28  28 26 27   GLUCOSE 104*  < > 89 89 95 114*  BUN 4*  < > <3* 3* <3* 3*  CREATININE 0.57  < > 0.55 0.57 0.47* 0.54  CALCIUM 7.9*  < > 8.1* 8.7 9.2 9.3  MG 1.3*  --  1.5 1.7  --   --   < > = values in this interval not displayed.  Liver Function Tests:  Recent Labs  07/02/12 2030 07/03/12 0400 07/04/12 0455  AST 22 20 16   ALT 54* 48 34  ALKPHOS 47 46 43  BILITOT 0.6 0.6 0.5  PROT 5.6* 5.4* 5.4*  ALBUMIN 2.5* 2.5* 2.3*    CBC:  Recent Labs  06/30/12 2335  07/03/12 0400 07/04/12 0455 07/05/12 0440  WBC 12.7*  < > 9.4 6.8 6.6  NEUTROABS 9.9*  --   --   --   --    HGB 15.4  < > 11.0* 10.4* 10.4*  HCT 44.6  < > 30.4* 29.5* 29.5*  MCV 87.5  < > 84.9 85.3 85.8  PLT 185  < > 119* 125* 133*  < > = values in this interval not displayed.  Assessment/Plan 1. Loss of weight 9 lbs in 4 mos Poor po intake Chocolate milk has been added to his tray, but he is not accepting food and no clear etiology for this has been found Resume hospice care  2. Unspecified protein-calorie malnutrition Check prealbumin  3. Adult failure to thrive Recurring Again has been assessed by dietitian  4. Seizures None recently Continue current therapy with lorazepam  5. CVA (cerebral infarction) -with associated dementia, longstanding, recently declining  6. Pain in joint, pelvic region and thigh, unspecified laterality -has been on robaxin for muscle spasms (involved left shoulder)--will reduce robaxin dose due to lethargy (to 1000mg  po bid instead of qid)  7. S/P AKA (above knee amputation) bilateral -remains, requires assistance with all ADLs  Family/ staff Communication: discussed with nursing staff, dietitian  Goals of care: DNR, palliative care, 10/20, hospice referral was ordered ([facility])  Labs/tests ordered:  Hospice referral

## 2013-05-07 ENCOUNTER — Encounter: Payer: Self-pay | Admitting: Internal Medicine

## 2013-05-07 MED ORDER — METHOCARBAMOL 500 MG PO TABS: 1000.0000 mg | ORAL_TABLET | Freq: Two times a day (BID) | ORAL | Status: AC

## 2013-05-13 ENCOUNTER — Non-Acute Institutional Stay (SKILLED_NURSING_FACILITY): Payer: Medicare Other | Admitting: Internal Medicine

## 2013-05-13 DIAGNOSIS — Z89611 Acquired absence of right leg above knee: Secondary | ICD-10-CM

## 2013-05-13 DIAGNOSIS — E46 Unspecified protein-calorie malnutrition: Secondary | ICD-10-CM

## 2013-05-13 DIAGNOSIS — R627 Adult failure to thrive: Secondary | ICD-10-CM

## 2013-05-13 DIAGNOSIS — R569 Unspecified convulsions: Secondary | ICD-10-CM

## 2013-05-13 DIAGNOSIS — I6350 Cerebral infarction due to unspecified occlusion or stenosis of unspecified cerebral artery: Secondary | ICD-10-CM

## 2013-05-13 DIAGNOSIS — N400 Enlarged prostate without lower urinary tract symptoms: Secondary | ICD-10-CM

## 2013-05-13 DIAGNOSIS — Z515 Encounter for palliative care: Secondary | ICD-10-CM | POA: Insufficient documentation

## 2013-05-13 DIAGNOSIS — S78119A Complete traumatic amputation at level between unspecified hip and knee, initial encounter: Secondary | ICD-10-CM

## 2013-05-13 DIAGNOSIS — I10 Essential (primary) hypertension: Secondary | ICD-10-CM

## 2013-05-13 DIAGNOSIS — I639 Cerebral infarction, unspecified: Secondary | ICD-10-CM

## 2013-05-13 NOTE — Progress Notes (Signed)
Patient ID: [name], male   DOB: 21-Feb-1946, 67 y.o.   MRN: [id number] Location:  [facility] Provider:  Gwenith Spitz. Renato Gails, D.O., C.M.D.  Code Status: DNR, hospice care, MOST form on chart  Chief Complaint  Patient presents with  . Medical Managment of Chronic Issues    med mgt chronic diseases, pt on hospice care    HPI:  67 yo male with h/o stroke in the distant past, seizure disorder, bilateral AKAs here for long term care now back on hospice due to progressive decline in function--eating less, losing weight, overall declining.  He has a h/o frequent infections due to urinary retention and suprapubic catheter with bph.    Review of Systems:  Review of Systems  Constitutional: Positive for malaise/fatigue. Negative for fever.  Respiratory: Negative for shortness of breath.   Cardiovascular: Negative for chest pain.  Gastrointestinal: Negative for abdominal pain.  Genitourinary: Negative for dysuria.       Has suprapubic  Musculoskeletal: Negative for back pain and joint pain.  Neurological: Positive for weakness. Negative for focal weakness.  Psychiatric/Behavioral: Positive for memory loss.    Medications: Patient's Medications  New Prescriptions   No medications on file  Previous Medications   HYOSCYAMINE (LEVSIN SL) 0.125 MG SL TABLET    Place 0.125 mg under the tongue every 4 (four) hours as needed for cramping.   LORAZEPAM (ATIVAN) 1 MG TABLET    Take 1 tablet by mouth every 8 hours as need for seizures.   METHOCARBAMOL (ROBAXIN) 500 MG TABLET    Take 2 tablets (1,000 mg total) by mouth 2 (two) times daily.   MORPHINE (ROXANOL) 20 MG/ML CONCENTRATED SOLUTION    Take 0.25 mLs (5 mg total) by mouth every 2 (two) hours as needed.   PROPYLENE GLYCOL (SYSTANE BALANCE OP)    Apply 1 drop to eye every 12 (twelve) hours. To both eyes   SENNOSIDES-DOCUSATE SODIUM (SENOKOT-S) 8.6-50 MG TABLET    Take 2 tablets by mouth at bedtime as needed for constipation.   Modified Medications   No medications on file  Discontinued Medications   No medications on file    Physical Exam: Physical Exam  Vitals reviewed. Constitutional: No distress.  Cardiovascular: Normal rate, regular rhythm and normal heart sounds.   Pulmonary/Chest: Effort normal and breath sounds normal. No respiratory distress.  Abdominal: Soft. Bowel sounds are normal. He exhibits no distension and no mass. There is no tenderness.  Genitourinary:  Suprapubic catheter in place with dark yellow urine  Musculoskeletal: Normal range of motion.  Bilateral AKA  Neurological: He is alert.  Skin: Skin is warm and dry.  Psychiatric: He has a normal mood and affect.     Labs reviewed: Basic Metabolic Panel:  Recent Labs  56/21/30 2030  07/05/12 0440 07/06/12 1020 07/07/12 0845 07/08/12 0500  NA 143  < > 143 141 139 138  K 2.8*  < > 2.7* 3.1* 3.4* 3.1*  CL 110  < > 106 104 100 100  CO2 25  < > 28 28 26 27   GLUCOSE 104*  < > 89 89 95 114*  BUN 4*  < > <3* 3* <3* 3*  CREATININE 0.57  < > 0.55 0.57 0.47* 0.54  CALCIUM 7.9*  < > 8.1* 8.7 9.2 9.3  MG 1.3*  --  1.5 1.7  --   --   < > = values in this interval not displayed.  Liver Function Tests:  Recent Labs  07/02/12 2030 07/03/12 0400 07/04/12 0455  AST 22 20 16   ALT 54* 48 34  ALKPHOS 47 46 43  BILITOT 0.6 0.6 0.5  PROT 5.6* 5.4* 5.4*  ALBUMIN 2.5* 2.5* 2.3*    CBC:  Recent Labs  06/30/12 2335  07/03/12 0400 07/04/12 0455 07/05/12 0440  WBC 12.7*  < > 9.4 6.8 6.6  NEUTROABS 9.9*  --   --   --   --   HGB 15.4  < > 11.0* 10.4* 10.4*  HCT 44.6  < > 30.4* 29.5* 29.5*  MCV 87.5  < > 84.9 85.3 85.8  PLT 185  < > 119* 125* 133*  < > = values in this interval not displayed. 04/20/13:  Wbc 9.6, h/h 12.2/34.8, plts 238;  Na 141, K 3.5, BUN 30, cr 0.86 06/30/12:  Dilantin 4.3 Assessment/Plan 1. Adult failure to thrive -overall decline, now on hospice care, weaker, more fatigued  2. Unspecified  protein-calorie malnutrition -due to poor po intake and FTT  3. Seizures -now on only prn lorazepam in the event he should have a seizure due to his prior SDH  4. CVA (cerebral infarction) -many years ago, has dementia as a result  5. Hyperplasia of prostate -no longer on medications for this  6. Essential hypertension, benign -bp satisfactory now w/o meds  7. S/P AKA (above knee amputation) bilateral bedbound at this point, due to weakness, unable to move himself in the bed despite use of both upper extremities  Goals of care: continues on [facility]--readmitted 04/27/13

## 2013-07-17 ENCOUNTER — Encounter: Payer: Self-pay | Admitting: Internal Medicine

## 2013-07-29 ENCOUNTER — Non-Acute Institutional Stay (SKILLED_NURSING_FACILITY): Payer: Medicare Other | Admitting: Internal Medicine

## 2013-07-29 DIAGNOSIS — I6200 Nontraumatic subdural hemorrhage, unspecified: Secondary | ICD-10-CM

## 2013-07-29 DIAGNOSIS — R569 Unspecified convulsions: Secondary | ICD-10-CM

## 2013-07-29 DIAGNOSIS — S065XAA Traumatic subdural hemorrhage with loss of consciousness status unknown, initial encounter: Secondary | ICD-10-CM

## 2013-07-29 DIAGNOSIS — E46 Unspecified protein-calorie malnutrition: Secondary | ICD-10-CM

## 2013-07-29 DIAGNOSIS — I10 Essential (primary) hypertension: Secondary | ICD-10-CM

## 2013-07-29 DIAGNOSIS — S065X9A Traumatic subdural hemorrhage with loss of consciousness of unspecified duration, initial encounter: Secondary | ICD-10-CM

## 2013-07-29 DIAGNOSIS — R339 Retention of urine, unspecified: Secondary | ICD-10-CM

## 2013-07-29 DIAGNOSIS — R627 Adult failure to thrive: Secondary | ICD-10-CM

## 2013-07-29 NOTE — Progress Notes (Signed)
Patient ID: [name], male   DOB: 11-24-1945, 68 y.o.   MRN: [id number]  Location:  [facility] Provider:  Gwenith Spitz. Renato Gails, D.O., C.M.D.  Code Status:  DNR   Chief Complaint  Patient presents with  . Medical Management of Chronic Issues    hospice pt    HPI:  68 yo black male with h/o stroke here, vascular dementia, bilateral AKAs, suprapubic catheter here for long term care.    Review of Systems:  Review of Systems  Constitutional: Negative for fever.  Eyes: Negative for blurred vision.  Respiratory: Negative for shortness of breath.   Cardiovascular: Negative for chest pain.  Gastrointestinal: Negative for abdominal pain and constipation.  Genitourinary:       Suprapubic catheter in place with reddish purple urine  Musculoskeletal: Negative for falls and joint pain.  Skin: Negative for rash.  Neurological: Negative for dizziness and headaches.  Endo/Heme/Allergies: Does not bruise/bleed easily.  Psychiatric/Behavioral: Positive for memory loss. Negative for depression.    Medications: Patient's Medications  New Prescriptions   No medications on file  Previous Medications   HYOSCYAMINE (LEVSIN SL) 0.125 MG SL TABLET    Place 0.125 mg under the tongue every 4 (four) hours as needed for cramping.   LEVETIRACETAM (KEPPRA) 250 MG TABLET    Take 250 mg by mouth 2 (two) times daily.   METHOCARBAMOL (ROBAXIN) 500 MG TABLET    Take 2 tablets (1,000 mg total) by mouth 2 (two) times daily.   MORPHINE (ROXANOL) 20 MG/ML CONCENTRATED SOLUTION    Take 0.25 mLs (5 mg total) by mouth every 2 (two) hours as needed.   MULTIPLE VITAMIN (MULTIVITAMIN) TABLET    Take 1 tablet by mouth daily.   PROPYLENE GLYCOL (SYSTANE BALANCE OP)    Apply 1 drop to eye every 12 (twelve) hours. To both eyes   SENNOSIDES-DOCUSATE SODIUM (SENOKOT-S) 8.6-50 MG TABLET    Take 2 tablets by mouth at bedtime as needed for constipation.  Modified Medications   Modified Medication Previous  Medication   LORAZEPAM (ATIVAN) 1 MG TABLET LORazepam (ATIVAN) 1 MG tablet      Take 1 tablet by mouth every 8 hours as need for seizures.    Take 1 tablet by mouth every 8 hours as need for seizures.  Discontinued Medications   No medications on file    Physical Exam: Filed Vitals:   07/29/13 2023  BP: 102/61  Pulse: 77  Temp: 98.7 F (37.1 C)  Resp: 18  Height: 3\' 6"  (1.067 m)  Weight: 125 lb (56.7 kg)  SpO2: 96%   Physical Exam  Constitutional: No distress.  Chronically ill appearing black male  Cardiovascular: Normal rate, regular rhythm and normal heart sounds.   Pulmonary/Chest: Effort normal and breath sounds normal. No respiratory distress.  Abdominal: Soft. Bowel sounds are normal. He exhibits no distension and no mass. There is no tenderness.  Genitourinary:  Suprapubic catheter  Musculoskeletal:  Bilateral AKAs;  Loss of musculature of hands  Neurological: He is alert.     Labs reviewed: None recently due to goals of care  Assessment/Plan 1. Urinary retention with incomplete bladder emptying -has chronic suprapubic catheter that is changed monthly  2. Adult failure to thrive -actually has improved somewhat since his readmission on hospice  3. Unspecified protein-calorie malnutrition -due to failure to thrive from end stage cerebrovascular disease  4. Seizures -on ativan only at present  5. Essential hypertension, benign -bp at goal without meds  6. Subdural hematoma -cause of vascular dementia   Family/ staff Communication: discussed with nursing staff Goals of care: DNR, hospice care  Labs/tests ordered:  none

## 2013-08-22 ENCOUNTER — Encounter: Payer: Self-pay | Admitting: Internal Medicine

## 2013-09-23 ENCOUNTER — Encounter: Payer: Self-pay | Admitting: Internal Medicine

## 2013-09-23 ENCOUNTER — Non-Acute Institutional Stay (SKILLED_NURSING_FACILITY): Payer: Medicare Other | Admitting: Internal Medicine

## 2013-09-23 DIAGNOSIS — S065X9A Traumatic subdural hemorrhage with loss of consciousness of unspecified duration, initial encounter: Secondary | ICD-10-CM

## 2013-09-23 DIAGNOSIS — S065XAA Traumatic subdural hemorrhage with loss of consciousness status unknown, initial encounter: Secondary | ICD-10-CM

## 2013-09-23 DIAGNOSIS — Z89611 Acquired absence of right leg above knee: Secondary | ICD-10-CM

## 2013-09-23 DIAGNOSIS — S78119A Complete traumatic amputation at level between unspecified hip and knee, initial encounter: Secondary | ICD-10-CM

## 2013-09-23 DIAGNOSIS — Z89612 Acquired absence of left leg above knee: Secondary | ICD-10-CM

## 2013-09-23 DIAGNOSIS — I6200 Nontraumatic subdural hemorrhage, unspecified: Secondary | ICD-10-CM

## 2013-09-23 DIAGNOSIS — R569 Unspecified convulsions: Secondary | ICD-10-CM

## 2013-09-23 DIAGNOSIS — Z9359 Other cystostomy status: Secondary | ICD-10-CM

## 2013-09-23 DIAGNOSIS — R339 Retention of urine, unspecified: Secondary | ICD-10-CM

## 2013-09-23 DIAGNOSIS — R627 Adult failure to thrive: Secondary | ICD-10-CM

## 2013-09-23 NOTE — Progress Notes (Signed)
Patient ID: [name], male   DOB: September 07, 1945, 68 y.o.   MRN: [id number]  Provider:  Gwenith Spitz. Renato Gails, D.O., C.M.D.  Location:  [facility]  PCP: Bufford Spikes, DO  Code Status: DNR/DNI  Allergies  Allergen Reactions  . Heparin Other (See Comments)    Hx of HIT in 2007    Chief Complaint  Patient presents with  . Medical Managment of Chronic Issues    HPI: 68 y.o. male with h/o subdural hematoma, DM, bilateral AKA, BPH with urinary obstruction and urinary retentionwith suprapubic catheter here for logn term care.  He had no complaints when seen.    ROS: Review of Systems  Constitutional: Negative for fever.  HENT: Negative for hearing loss.   Eyes: Negative for blurred vision.  Respiratory: Negative for shortness of breath.   Cardiovascular: Negative for chest pain.  Gastrointestinal: Negative for abdominal pain, blood in stool and melena.  Genitourinary: Negative for flank pain.  Musculoskeletal: Negative for falls, joint pain and myalgias.  Skin: Negative for rash.  Neurological: Negative for dizziness.  Psychiatric/Behavioral: Positive for memory loss.    Past Medical History  Diagnosis Date  . Diabetes mellitus   . Orchitis and epididymitis in disease classified elsewhere   . Unspecified intestinal obstruction   . Unspecified osteomyelitis, site unspecified   . Encounter for long-term (current) use of other medications   . Hypertrophy of prostate without urinary obstruction and other lower urinary tract symptoms (LUTS)   . Depressive disorder, not elsewhere classified   . Edema of penis   . Pulmonary congestion and hypostasis   . Other ascites   . Thrombocytopenia   . Urinary tract infection, site not specified   . Chronic ulcer of other specified site   . Acute, but ill-defined, cerebrovascular disease   . Intestinal infection due to E. coli, unspecified   . Other convulsions   . Unspecified essential hypertension   . Urinary tract  infection, site not specified   . Acute upper respiratory infections of unspecified site   . Other chronic pain   . Unspecified psychosis   . Unspecified constipation   . Acute kidney failure, unspecified    Past Surgical History  Procedure Laterality Date  . Below knee leg amputation      ABove the knee AKA bil  . Vp shunt placement  1990  . Circumcision  2007  . Suprapubic catheter placement  2007   Social History:   reports that he has quit smoking. His smoking use included Cigarettes. He smoked 0.00 packs per day. He has never used smokeless tobacco. He reports that he does not drink alcohol or use illicit drugs.  No family history on file.  Medications: Patient's Medications  New Prescriptions   No medications on file  Previous Medications   HYOSCYAMINE (LEVSIN SL) 0.125 MG SL TABLET    Place 0.125 mg under the tongue every 4 (four) hours as needed for cramping.   LEVETIRACETAM (KEPPRA) 250 MG TABLET    Take 250 mg by mouth 2 (two) times daily.   LORAZEPAM (ATIVAN) 1 MG TABLET    Take 1 tablet by mouth every 8 hours as need for seizures.   METHOCARBAMOL (ROBAXIN) 500 MG TABLET    Take 2 tablets (1,000 mg total) by mouth 2 (two) times daily.   MORPHINE (ROXANOL) 20 MG/ML CONCENTRATED SOLUTION    Take 0.25 mLs (5 mg total) by mouth every 2 (two) hours as needed.   MULTIPLE VITAMIN (  MULTIVITAMIN) TABLET    Take 1 tablet by mouth daily.   PROPYLENE GLYCOL (SYSTANE BALANCE OP)    Apply 1 drop to eye every 12 (twelve) hours. To both eyes   SENNOSIDES-DOCUSATE SODIUM (SENOKOT-S) 8.6-50 MG TABLET    Take 2 tablets by mouth at bedtime as needed for constipation.  Modified Medications   No medications on file  Discontinued Medications   No medications on file     Physical Exam: Filed Vitals:   09/23/13 1133  BP: 118/50  Pulse: 85  Temp: 97.9 F (36.6 C)  Resp: 18  Weight: 121 lb (54.885 kg)   Physical Exam  Constitutional: No distress.  Cardiovascular: Normal rate,  regular rhythm and normal heart sounds.   Pulmonary/Chest: Effort normal and breath sounds normal. No respiratory distress. He has no wheezes. He has no rales.  Abdominal: Soft. Bowel sounds are normal. He exhibits no distension and no mass. There is no tenderness.  Genitourinary:  Suprapubic catheter in place with dark yellow to amber urine  Musculoskeletal: Normal range of motion.  Of UE, bilateral akas  Neurological: He is alert.  Skin: Skin is warm and dry.  Scaly skin  Psychiatric: He has a normal mood and affect.     Labs reviewed: 04/20/2013: CBC: WBC 9.6, H/H 12.2/34.8, Plt 238 BMP: G 94, Na 141, K 3.5, BUN 30, Cre 0.86  Assessment/Plan 1. Subdural hematoma -was cause of dementia and decline -here for long term care--dependent in adls  2. Adult failure to thrive -has stablized again  3. Seizures -cont keppra and prn ativan  4. Urinary retention with incomplete bladder emptying -cont suprapubic catheter   5. Presence of suprapubic catheter -to be flushed regularly and changed monthly  6. S/P AKA (above knee amputation) bilateral -due to PAD  Functional status:dependent in adls  Family/ staff Communication: discussed with nursing

## 2013-10-14 ENCOUNTER — Other Ambulatory Visit: Payer: Self-pay | Admitting: *Deleted

## 2013-10-14 MED ORDER — LORAZEPAM 1 MG PO TABS: ORAL_TABLET | ORAL | Status: AC

## 2013-10-14 NOTE — Telephone Encounter (Signed)
[facility] [place] 

## 2013-11-25 ENCOUNTER — Encounter: Payer: Self-pay | Admitting: Internal Medicine

## 2013-11-25 ENCOUNTER — Non-Acute Institutional Stay (SKILLED_NURSING_FACILITY): Payer: Medicare Other | Admitting: Internal Medicine

## 2013-11-25 DIAGNOSIS — I10 Essential (primary) hypertension: Secondary | ICD-10-CM

## 2013-11-25 DIAGNOSIS — I679 Cerebrovascular disease, unspecified: Secondary | ICD-10-CM | POA: Insufficient documentation

## 2013-11-25 DIAGNOSIS — K5900 Constipation, unspecified: Secondary | ICD-10-CM

## 2013-11-25 DIAGNOSIS — R339 Retention of urine, unspecified: Secondary | ICD-10-CM | POA: Insufficient documentation

## 2013-11-25 DIAGNOSIS — R569 Unspecified convulsions: Secondary | ICD-10-CM

## 2013-11-25 DIAGNOSIS — K5909 Other constipation: Secondary | ICD-10-CM

## 2013-11-25 NOTE — Progress Notes (Signed)
Patient ID: [name], male   DOB: 11/06/1945, 68 y.o.   MRN: [id number]  Location:  [facility] Provider:  Gwenith Spitziffany L. Renato Gailseed, D.O., C.M.D.  Code Status:  DNR  Chief Complaint  Patient presents with  . Medical Management of Chronic Issues    HPI:  68 yo black male with h/o subdural hematoma, severe PAD s/p bilateral AKAs, urinary retention with suprapubic catheter, diabetes with vascular complications, vascular dementia here for long term care was seen for medical mgt of chronic diseases.  He had no complaints.  He speaks minimally.  He mostly nods his head, is pleasant, spends his days in bed.  He remained stable and was taken off of hospice care.    Review of Systems:  Review of Systems  Constitutional: Negative for fever.  HENT: Negative for congestion.   Eyes: Negative for blurred vision.  Respiratory: Negative for shortness of breath.   Cardiovascular: Negative for chest pain and leg swelling.  Gastrointestinal: Negative for abdominal pain and constipation.  Genitourinary:       Urinary retention with suprapubic catheter  Musculoskeletal: Negative for falls.  Skin: Negative for rash.  Neurological: Positive for seizures. Negative for loss of consciousness and headaches.       No recent seizures  Endo/Heme/Allergies: Bruises/bleeds easily.  Psychiatric/Behavioral: Positive for memory loss.    Medications: Patient's Medications  New Prescriptions   No medications on file  Previous Medications   HYOSCYAMINE (LEVSIN SL) 0.125 MG SL TABLET    Place 0.125 mg under the tongue every 4 (four) hours as needed for cramping.   LEVETIRACETAM (KEPPRA) 250 MG TABLET    Take 250 mg by mouth 2 (two) times daily.   LORAZEPAM (ATIVAN) 1 MG TABLET    Take 1 tablet by mouth every 8 hours as need for seizures.   METHOCARBAMOL (ROBAXIN) 500 MG TABLET    Take 2 tablets (1,000 mg total) by mouth 2 (two) times daily.   MORPHINE (ROXANOL) 20 MG/ML CONCENTRATED SOLUTION    Take  0.25 mLs (5 mg total) by mouth every 2 (two) hours as needed.   MULTIPLE VITAMIN (MULTIVITAMIN) TABLET    Take 1 tablet by mouth daily.   PROPYLENE GLYCOL (SYSTANE BALANCE OP)    Apply 1 drop to eye every 12 (twelve) hours. To both eyes   SENNOSIDES-DOCUSATE SODIUM (SENOKOT-S) 8.6-50 MG TABLET    Take 2 tablets by mouth at bedtime as needed for constipation.  Modified Medications   No medications on file  Discontinued Medications   No medications on file    Physical Exam: There were no vitals filed for this visit. Physical Exam  Constitutional:  Dry appearing black male resting in bed  HENT:  Mucous membranes dry, tongue with slight white film (not thrush)  Cardiovascular: Normal rate, regular rhythm and normal heart sounds.   Pulmonary/Chest: Effort normal and breath sounds normal. No respiratory distress.  Abdominal: Soft. Bowel sounds are normal. He exhibits no distension and no mass. There is no tenderness.  Genitourinary:  Suprapubic catheter draining dark yellow to brown urine  Musculoskeletal:  Wasting of soft tissues of upper extremities, developing contractures in hands  Neurological: He is alert.  Skin: Skin is warm and dry.  Psychiatric: He has a normal mood and affect.   Labs reviewed: None since 10/14  Assessment/Plan 1. Cerebrovascular disease -with vascular dementia since subdural hematoma -has been stable now for several months -is dependent in all adls, seems to be losing function of  his upper extremities (? Cervical myelopathy based on appearance) -is on comfort measures, but off hospice   2. Seizures -none recently -continues on keppra  3. Urinary retention with incomplete bladder emptying -continues with suprapubic catheter -urine is very dark -will order nursing staff to push po fluids for him  4. Essential hypertension, benign -bp at goal w/o medications  5. Chronic constipation -cont levsin as needed for cramps and senna s  Family/ staff  Communication: discussed with nursing staff  Goals of care: DNR, comfort measures  Labs/tests ordered:  Cbc, cmp, hba1c

## 2014-01-03 ENCOUNTER — Encounter: Payer: Self-pay | Admitting: Internal Medicine

## 2014-01-27 ENCOUNTER — Non-Acute Institutional Stay (SKILLED_NURSING_FACILITY): Payer: Medicare Other | Admitting: Internal Medicine

## 2014-01-27 DIAGNOSIS — R569 Unspecified convulsions: Secondary | ICD-10-CM

## 2014-01-27 DIAGNOSIS — K5909 Other constipation: Secondary | ICD-10-CM

## 2014-01-27 DIAGNOSIS — K5900 Constipation, unspecified: Secondary | ICD-10-CM

## 2014-01-27 DIAGNOSIS — I10 Essential (primary) hypertension: Secondary | ICD-10-CM

## 2014-01-27 DIAGNOSIS — I679 Cerebrovascular disease, unspecified: Secondary | ICD-10-CM

## 2014-01-27 DIAGNOSIS — R339 Retention of urine, unspecified: Secondary | ICD-10-CM

## 2014-03-17 ENCOUNTER — Non-Acute Institutional Stay (SKILLED_NURSING_FACILITY): Payer: Medicare Other | Admitting: Internal Medicine

## 2014-03-17 DIAGNOSIS — S065X9A Traumatic subdural hemorrhage with loss of consciousness of unspecified duration, initial encounter: Secondary | ICD-10-CM

## 2014-03-17 DIAGNOSIS — S78119A Complete traumatic amputation at level between unspecified hip and knee, initial encounter: Secondary | ICD-10-CM

## 2014-03-17 DIAGNOSIS — S065XAA Traumatic subdural hemorrhage with loss of consciousness status unknown, initial encounter: Secondary | ICD-10-CM

## 2014-03-17 DIAGNOSIS — I10 Essential (primary) hypertension: Secondary | ICD-10-CM

## 2014-03-17 DIAGNOSIS — R339 Retention of urine, unspecified: Secondary | ICD-10-CM

## 2014-03-17 DIAGNOSIS — I6200 Nontraumatic subdural hemorrhage, unspecified: Secondary | ICD-10-CM

## 2014-03-17 DIAGNOSIS — Z9359 Other cystostomy status: Secondary | ICD-10-CM

## 2014-03-17 DIAGNOSIS — K5900 Constipation, unspecified: Secondary | ICD-10-CM

## 2014-03-17 DIAGNOSIS — Z89612 Acquired absence of left leg above knee: Secondary | ICD-10-CM

## 2014-03-17 DIAGNOSIS — Z89611 Acquired absence of right leg above knee: Secondary | ICD-10-CM

## 2014-03-17 DIAGNOSIS — R569 Unspecified convulsions: Secondary | ICD-10-CM

## 2014-03-17 DIAGNOSIS — K5909 Other constipation: Secondary | ICD-10-CM

## 2014-03-17 NOTE — Progress Notes (Signed)
Patient ID: [name], male   DOB: [birth year], 68 y.o.   MRN: [id number]  Location:  [facility] Provider:  Gwenith Spitz. Renato Gails, D.O., C.M.D.  Code Status:  DNR  Chief Complaint  Patient presents with  . Medical Management of Chronic Issues    HPI:  68 yo black male here for long term care.  He was on hospice for a period of time but stabilized.  He has urinary retention, with suprapubic catheter and has previously had subdurals that caused his dementia and aphasia.  He has PAD s/p bilateral AKAs.  He's had persistent hematuria.  He gets his catheter changed every month.    Review of Systems:  Review of Systems  Constitutional: Negative for fever.  HENT: Negative for hearing loss.   Eyes: Negative for blurred vision.  Respiratory: Negative for shortness of breath.   Cardiovascular: Negative for chest pain.  Gastrointestinal: Negative for abdominal pain, constipation, blood in stool and melena.  Genitourinary: Positive for hematuria.       Suprapubic catheter  Musculoskeletal: Negative for myalgias, back pain, joint pain, falls and neck pain.  Skin: Negative for rash.  Neurological: Negative for dizziness and headaches.  Psychiatric/Behavioral: Positive for memory loss.    Medications: Patient's Medications  New Prescriptions   No medications on file  Previous Medications   HYOSCYAMINE (LEVSIN SL) 0.125 MG SL TABLET    Place 0.125 mg under the tongue every 4 (four) hours as needed for cramping.   LEVETIRACETAM (KEPPRA) 250 MG TABLET    Take 250 mg by mouth 2 (two) times daily.   LORAZEPAM (ATIVAN) 1 MG TABLET    Take 1 tablet by mouth every 8 hours as need for seizures.   METHOCARBAMOL (ROBAXIN) 500 MG TABLET    Take 2 tablets (1,000 mg total) by mouth 2 (two) times daily.   MULTIPLE VITAMIN (MULTIVITAMIN) TABLET    Take 1 tablet by mouth daily.   PROPYLENE GLYCOL (SYSTANE BALANCE OP)    Apply 1 drop to eye every 12 (twelve) hours. To both eyes   SENNOSIDES-DOCUSATE SODIUM (SENOKOT-S) 8.6-50 MG TABLET    Take 2 tablets by mouth at bedtime as needed for constipation.  Modified Medications   No medications on file  Discontinued Medications   MORPHINE (ROXANOL) 20 MG/ML CONCENTRATED SOLUTION    Take 0.25 mLs (5 mg total) by mouth every 2 (two) hours as needed.    Physical Exam: Filed Vitals:   03/17/14 1644  BP: 112/70  Pulse: 68  Temp: 98.1 F (36.7 C)  Resp: 20  Height:  (1.067 m)  Weight: 114 lb (51.71 kg)  SpO2: 97%   Physical Exam  Constitutional:  Chronically ill, disheveled black male resting in bed; smells strongly of urine  Cardiovascular: Normal rate, regular rhythm and normal heart sounds.   Pulmonary/Chest: Effort normal and breath sounds normal. No respiratory distress.  Abdominal: Soft. Bowel sounds are normal. He exhibits no distension and no mass. There is no tenderness.  Genitourinary:  Suprapubic catheter in place with purplish/very dark red urine   Musculoskeletal:  Bilateral AKAs  Neurological: He is alert.  Skin: Skin is warm and dry.  Seborrhea and conjunctival drainage present  Psychiatric: He has a normal mood and affect.    Assessment/Plan 1. Urinary retention with incomplete bladder emptying -cont suprapubic catheter changing monthly -pt must be kept clean and dry to prevent infections   2. Essential hypertension, benign -bp is controlled w/o meds  3. Chronic  constipation -cont senna s and standing orders if no bm   4. Subdural hematoma -cause of his dementia and aphasia previously -dependent in adls--sometimes refuses care leading to poor hygiene  5. Seizures -cont keppra  6. S/P AKA (above knee amputation) bilateral -noted with PAD  7. Presence of suprapubic catheter -draining bloody urine longstanding, on palliative care at this point--"graduated" hospice   Family/ staff Communication: discussed with his nurse and DNS re: hygiene  Goals of care: palliative care--DNR,  avoid hospitalizations  Labs/tests ordered:  No new

## 2014-04-04 DIAGNOSIS — S065XAA Traumatic subdural hemorrhage with loss of consciousness status unknown, initial encounter: Secondary | ICD-10-CM | POA: Insufficient documentation

## 2014-04-04 DIAGNOSIS — S065X9A Traumatic subdural hemorrhage with loss of consciousness of unspecified duration, initial encounter: Secondary | ICD-10-CM | POA: Insufficient documentation

## 2014-04-04 HISTORY — DX: Traumatic subdural hemorrhage with loss of consciousness of unspecified duration, initial encounter: S06.5X9A

## 2014-04-04 HISTORY — DX: Traumatic subdural hemorrhage with loss of consciousness status unknown, initial encounter: S06.5XAA

## 2014-05-26 ENCOUNTER — Non-Acute Institutional Stay (SKILLED_NURSING_FACILITY): Payer: Medicare Other | Admitting: Internal Medicine

## 2014-05-26 DIAGNOSIS — K5900 Constipation, unspecified: Secondary | ICD-10-CM | POA: Diagnosis not present

## 2014-05-26 DIAGNOSIS — R319 Hematuria, unspecified: Secondary | ICD-10-CM | POA: Diagnosis not present

## 2014-05-26 DIAGNOSIS — K5909 Other constipation: Secondary | ICD-10-CM

## 2014-05-26 DIAGNOSIS — R339 Retention of urine, unspecified: Secondary | ICD-10-CM | POA: Diagnosis not present

## 2014-05-26 DIAGNOSIS — I6200 Nontraumatic subdural hemorrhage, unspecified: Secondary | ICD-10-CM | POA: Diagnosis not present

## 2014-05-26 DIAGNOSIS — S065X9A Traumatic subdural hemorrhage with loss of consciousness of unspecified duration, initial encounter: Secondary | ICD-10-CM

## 2014-05-26 DIAGNOSIS — Z89611 Acquired absence of right leg above knee: Secondary | ICD-10-CM | POA: Diagnosis not present

## 2014-05-26 DIAGNOSIS — R569 Unspecified convulsions: Secondary | ICD-10-CM

## 2014-05-26 DIAGNOSIS — S065XAA Traumatic subdural hemorrhage with loss of consciousness status unknown, initial encounter: Secondary | ICD-10-CM

## 2014-05-26 DIAGNOSIS — Z89612 Acquired absence of left leg above knee: Secondary | ICD-10-CM | POA: Diagnosis not present

## 2014-05-26 NOTE — Progress Notes (Signed)
Patient ID: [name], male   DOB: 12/11/1945, 68 y.o.   MRN: [name]  Location:  [facility]  Provider:  Gwenith Spitziffany L. Renato Gailseed, D.O., C.M.D.  Code Status:  DNR  Chief Complaint  Patient presents with  . Medical Management of Chronic Issues    HPI:  68 yo black male with h/o PAD s/p bilateral akas, subdural hematoma, vascular dementia, chronic shoulder pain, chronic constipation, seizures and urinary retention (neurogenic bladder) with suprapubic catheter was seen for med mgt.  Staff have no new concerns.  His foley bag is intermittently dark red to purple at times, other times has large amts sediment--is to be changed monthly.  Was just done.  He has no complaints, but cannot provide good history. Review of Systems:  Review of Systems  Unable to perform ROS: dementia    Medications: Patient's Medications  New Prescriptions   No medications on file  Previous Medications   ESCITALOPRAM (LEXAPRO) 5 MG TABLET    Take 5 mg by mouth daily.   HYOSCYAMINE (LEVSIN SL) 0.125 MG SL TABLET    Place 0.125 mg under the tongue every 4 (four) hours as needed for cramping.   LEVETIRACETAM (KEPPRA) 250 MG TABLET    Take 500 mg by mouth 2 (two) times daily.    LORAZEPAM (ATIVAN) 1 MG TABLET    Take 1 tablet by mouth every 8 hours as need for seizures.   METHOCARBAMOL (ROBAXIN) 500 MG TABLET    Take 2 tablets (1,000 mg total) by mouth 2 (two) times daily.   MULTIPLE VITAMIN (MULTIVITAMIN) TABLET    Take 1 tablet by mouth daily.   OXYCODONE (OXY IR/ROXICODONE) 5 MG IMMEDIATE RELEASE TABLET    Take 5 mg by mouth once a week. Prior to wound doctor visit   PROPYLENE GLYCOL (SYSTANE BALANCE OP)    Apply 1 drop to eye every 12 (twelve) hours. To both eyes   SENNOSIDES-DOCUSATE SODIUM (SENOKOT-S) 8.6-50 MG TABLET    Take 2 tablets by mouth at bedtime as needed for constipation.  Modified Medications   Modified Medication Previous Medication   MORPHINE SULFATE (MORPHINE CONCENTRATE) 10 MG /  0.5 ML CONCENTRATED SOLUTION Morphine Sulfate (MORPHINE CONCENTRATE) 10 mg / 0.5 ml concentrated solution      Give 0.4125ml by mouth every hour as needed for pain/distress EMVB    Give 0.4125ml by mouth every hour as needed for pain/distress EMVB  Discontinued Medications   No medications on file    Physical Exam: Filed Vitals:   05/26/14 1052  BP: 88/58  Pulse: 65  Temp: 98.2 F (36.8 C)  Resp: 18  Height: 3\' 6"  (1.067 m)  Weight: 109 lb (49.442 kg)  SpO2: 97%   Physical Exam  Constitutional: No distress.  Cardiovascular: Normal rate, regular rhythm and normal heart sounds.   Pulmonary/Chest: Effort normal and breath sounds normal.  Abdominal: Soft. Bowel sounds are normal. He exhibits no distension and no mass. There is no tenderness.  Genitourinary:  Suprapubic catheter draining purple urine  Musculoskeletal:  Bilateral aka; decreased ROM shoulders bilaterally  Neurological:  Does answer questions, but only one word responses  Skin: Skin is warm and dry.     Labs reviewed: No new  Assessment/Plan 1. Urinary retention with incomplete bladder emptying -with chronic suprapubic catheter due to his neurogenic bladder from [facility]  2. Subdural hematoma -with resultant vascular dementia, minimally verbal and responses not always reliable -staff say he resists care at times  3. S/P AKA (  above knee amputation) bilateral -no wounds at present  4. Seizures -cont keppra  5. Chronic constipation -cont current bowel regimen; had an episode of impaction in past  6. Hematuria -suspect traumatic due to catheter change, cont to monitor--would not workup aggressively due to goals of care being comfort measures--if had other s/s of infection, would treat that, but would not workup malignancy  Family/ staff Communication: discussed with treatment nurse who changed catheter  Goals of care: long term care, DNR, previously hospice  Labs/tests ordered:  No new

## 2014-06-22 ENCOUNTER — Encounter: Payer: Self-pay | Admitting: Internal Medicine

## 2014-06-22 NOTE — Progress Notes (Signed)
Patient ID: Evan KohutHaywood L Musa, male   DOB: 07/14/1945, 68 y.o.   MRN: [id number]  Location:  [facility] Provider:  Gwenith Spitziffany L. Renato Gailseed, D.O., C.M.D.  Code Status:  DNR  Chief Complaint  Patient presents with  . Medical Management of Chronic Issues    HPI:  68 yo black male with h/o subdural hematoma, severe PAD s/p bilateral AKAs, urinary retention with suprapubic catheter, diabetes with vascular complications, vascular dementia here for long term care was seen for medical mgt of chronic diseases.  He had no complaints.  He speaks minimally.  He mostly nods his head, is pleasant, spends his days in bed.  He remained stable and was taken off of hospice care.    Review of Systems:  Review of Systems  Constitutional: Negative for fever.  HENT: Negative for congestion.   Eyes: Negative for blurred vision.  Respiratory: Negative for shortness of breath.   Cardiovascular: Negative for chest pain and leg swelling.  Gastrointestinal: Negative for abdominal pain and constipation.  Genitourinary:       Urinary retention with suprapubic catheter  Musculoskeletal: Negative for falls.  Skin: Negative for rash.  Neurological: Positive for seizures. Negative for loss of consciousness and headaches.       No recent seizures  Endo/Heme/Allergies: Bruises/bleeds easily.  Psychiatric/Behavioral: Positive for memory loss.    Medications: Patient's Medications  New Prescriptions   No medications on file  Previous Medications   HYOSCYAMINE (LEVSIN SL) 0.125 MG SL TABLET    Place 0.125 mg under the tongue every 4 (four) hours as needed for cramping.   LEVETIRACETAM (KEPPRA) 250 MG TABLET    Take 250 mg by mouth 2 (two) times daily.   LORAZEPAM (ATIVAN) 1 MG TABLET    Take 1 tablet by mouth every 8 hours as need for seizures.   METHOCARBAMOL (ROBAXIN) 500 MG TABLET    Take 2 tablets (1,000 mg total) by mouth 2 (two) times daily.   MULTIPLE VITAMIN (MULTIVITAMIN) TABLET    Take 1 tablet by  mouth daily.   PROPYLENE GLYCOL (SYSTANE BALANCE OP)    Apply 1 drop to eye every 12 (twelve) hours. To both eyes   SENNOSIDES-DOCUSATE SODIUM (SENOKOT-S) 8.6-50 MG TABLET    Take 2 tablets by mouth at bedtime as needed for constipation.  Modified Medications   No medications on file  Discontinued Medications   MORPHINE (ROXANOL) 20 MG/ML CONCENTRATED SOLUTION    Take 0.25 mLs (5 mg total) by mouth every 2 (two) hours as needed.    Physical Exam: Filed Vitals:   06/22/14 1043  BP: 97/62  Pulse: 74  Temp: 97.6 F (36.4 C)  Resp: 18  Height: 3\' 6"  (1.067 m)  Weight: 116 lb (52.617 kg)  SpO2: 97%   Physical Exam  Constitutional:  Dry appearing black male resting in bed  HENT:  Mucous membranes dry, tongue with slight white film (not thrush)  Cardiovascular: Normal rate, regular rhythm and normal heart sounds.   Pulmonary/Chest: Effort normal and breath sounds normal. No respiratory distress.  Abdominal: Soft. Bowel sounds are normal. He exhibits no distension and no mass. There is no tenderness.  Genitourinary:  Suprapubic catheter draining dark yellow to brown urine  Musculoskeletal:  Wasting of soft tissues of upper extremities, developing contractures in hands  Neurological: He is alert.  Skin: Skin is warm and dry.  Psychiatric: He has a normal mood and affect.   Labs reviewed: Cbc, cmp, hba1c reviewed from last visit  Assessment/Plan 1. Cerebrovascular disease -with vascular dementia since subdural hematoma -no recent changes -is dependent in all adls, seems to be losing function of his upper extremities (? Cervical myelopathy based on appearance) -is on comfort measures, but off hospice   2. Seizures -none recently -continues on keppra  3. Urinary retention with incomplete bladder emptying -continues with suprapubic catheter -urine is very dark almost purple - push po fluids for him -gets monthly change of catheter by treatment nurse  4. Essential  hypertension, benign -bp at goal w/o medications  5. Chronic constipation -cont levsin as needed for cramps and senna s  Family/ staff Communication: discussed with nursing staff  Goals of care: DNR, comfort measures  Labs/tests ordered:  Cbc

## 2014-06-28 ENCOUNTER — Non-Acute Institutional Stay (SKILLED_NURSING_FACILITY): Payer: Medicare Other | Admitting: Adult Health

## 2014-06-28 DIAGNOSIS — R569 Unspecified convulsions: Secondary | ICD-10-CM

## 2014-06-28 DIAGNOSIS — G8929 Other chronic pain: Secondary | ICD-10-CM

## 2014-06-28 DIAGNOSIS — R627 Adult failure to thrive: Secondary | ICD-10-CM

## 2014-06-28 DIAGNOSIS — I639 Cerebral infarction, unspecified: Secondary | ICD-10-CM

## 2014-06-28 DIAGNOSIS — L89154 Pressure ulcer of sacral region, stage 4: Secondary | ICD-10-CM

## 2014-07-03 ENCOUNTER — Encounter: Payer: Self-pay | Admitting: Adult Health

## 2014-07-03 NOTE — Progress Notes (Signed)
Patient ID: [name], male   DOB: 1946/06/29, 69 y.o.   MRN: [id number]  [facility]     Allergies  Allergen Reactions  . Heparin Other (See Comments)    Hx of HIT in 2007    CODE STATUS: DNR; NO HOSPITALIZATION; NO IVF; NO TUBE FEEDING   Chief Complaint  Patient presents with  . Medical Management of Chronic Issues    HPI:  He is a long term resident of this facility being seen for the management of his chronic illnesses. Overall there has been little change in his recent status. His keppra was increased to 500 mg twice daily. Staff reports that his appetite is decreased. He is unable to fully participate in the hpi or ros.     Past Medical History  Diagnosis Date  . Diabetes mellitus   . Orchitis and epididymitis in disease classified elsewhere   . Unspecified intestinal obstruction   . Unspecified osteomyelitis, site unspecified   . Encounter for long-term (current) use of other medications   . Hypertrophy of prostate without urinary obstruction and other lower urinary tract symptoms (LUTS)   . Depressive disorder, not elsewhere classified   . Edema of penis   . Pulmonary congestion and hypostasis   . Other ascites   . Thrombocytopenia   . Urinary tract infection, site not specified   . Chronic ulcer of other specified site   . Acute, but ill-defined, cerebrovascular disease   . Intestinal infection due to E. coli, unspecified   . Other convulsions   . Unspecified essential hypertension   . Urinary tract infection, site not specified   . Acute upper respiratory infections of unspecified site   . Other chronic pain   . Unspecified psychosis   . Unspecified constipation   . Acute kidney failure, unspecified     Past Surgical History  Procedure Laterality Date  . Below knee leg amputation      ABove the knee AKA bil  . Vp shunt placement  1990  . Circumcision  2007  . Suprapubic catheter placement  2007    VITAL SIGNS BP 116/67 mmHg  Pulse 81  Ht   (1.067 m)  Wt 107 lb (48.535 kg)  BMI 42.63 kg/m2  SpO2 97%   Outpatient Encounter Prescriptions as of 06/28/2014  Medication Sig  . escitalopram (LEXAPRO) 5 MG tablet Take 5 mg by mouth daily.  . hyoscyamine (LEVSIN SL) 0.125 MG SL tablet Place 0.125 mg under the tongue every 4 (four) hours as needed for cramping.  . levETIRAcetam (KEPPRA) 250 MG tablet Take 500 mg by mouth 2 (two) times daily.   Marland Kitchen LORazepam (ATIVAN) 1 MG tablet Take 1 tablet by mouth every 8 hours as need for seizures.  . methocarbamol (ROBAXIN) 500 MG tablet Take 2 tablets (1,000 mg total) by mouth 2 (two) times daily.  . Multiple Vitamin (MULTIVITAMIN) tablet Take 1 tablet by mouth daily.  Marland Kitchen Propylene Glycol (SYSTANE BALANCE OP) Apply 1 drop to eye every 12 (twelve) hours. To both eyes  . sennosides-docusate sodium (SENOKOT-S) 8.6-50 MG tablet Take 2 tablets by mouth at bedtime as needed for constipation.     SIGNIFICANT DIAGNOSTIC EXAMS  LABS REVIEWED:   02-26-14: wbc 6.9; hgb 12.5; hct 38.7 ;mcv 89.3; plt 196; glucose 79; bun 18.4; creat 0.6; k+4.1; na++143; liver normal albumin 3.7; hgb a1c 5.3     Review of Systems  Unable to perform ROS    Physical Exam  Constitutional: No distress.  Frail   Neck: Neck supple. No JVD present. No thyromegaly present.  Cardiovascular: Normal rate and regular rhythm.   Respiratory: Effort normal and breath sounds normal. No respiratory distress.  GI: Soft. Bowel sounds are normal. He exhibits no distension.  Genitourinary:  Has s/p foley  Musculoskeletal:  Bilateral aka; Left upper arm contracture   Neurological: He is alert.  Skin: Skin is warm and dry. He is not diaphoretic.  Stage IV sacral ulcer: 4.5 x 5.6 x 0.11 cm with yellow slough present; followed by wound MD.        ASSESSMENT/ PLAN:  1. Seizure: no reports of recent seizure activity present. Will continue keppra 500 mg twice daily; and ativan 1 mg every 8 hours as needed will monitor  2.   Stage IV sacral ulcer: will continue current treatment plan is followed by wound MD will monitor  3. CVA: is neurologically stable; is presently not on medications; will not make changes   4. Chronic pain: no indications of pain present; will continue robaxin 1 gm twice daily for spasticity and will continue levsin 0.125 mg every 4 hours as needed for abdominal spasms  5. Depression: will continue lexapro 5 mg daily   6. FTT: his current weight is 107 pounds; will continue current plan of care and will monitor his status  Will check cbc; cmp      Synthia Innocent NP [facility]  Contact [phone] Monday through Friday 8am- 5pm  After hours call [phone]

## 2014-07-25 ENCOUNTER — Encounter: Payer: Self-pay | Admitting: Internal Medicine

## 2014-07-28 ENCOUNTER — Encounter: Payer: Self-pay | Admitting: Adult Health

## 2014-07-28 ENCOUNTER — Non-Acute Institutional Stay (SKILLED_NURSING_FACILITY): Payer: Medicare Other | Admitting: Adult Health

## 2014-07-28 DIAGNOSIS — L89154 Pressure ulcer of sacral region, stage 4: Secondary | ICD-10-CM

## 2014-07-28 DIAGNOSIS — M2450 Contracture, unspecified joint: Secondary | ICD-10-CM

## 2014-07-28 DIAGNOSIS — R627 Adult failure to thrive: Secondary | ICD-10-CM

## 2014-07-28 DIAGNOSIS — Z89612 Acquired absence of left leg above knee: Secondary | ICD-10-CM

## 2014-07-28 DIAGNOSIS — Z89611 Acquired absence of right leg above knee: Secondary | ICD-10-CM

## 2014-07-28 DIAGNOSIS — I639 Cerebral infarction, unspecified: Secondary | ICD-10-CM | POA: Insufficient documentation

## 2014-07-28 DIAGNOSIS — R569 Unspecified convulsions: Secondary | ICD-10-CM

## 2014-07-28 NOTE — Progress Notes (Signed)
Patient ID: [name], male   DOB: Apr 19, 1946, 69 y.o.   MRN: [id number]  [facility]      Allergies  Allergen Reactions  . Heparin Other (See Comments)    Hx of HIT in 2007    CODE STATUS: DNR; NO HOSPITALIZATION; NO IVF; NO ABT; NO TUBE FEEDING      Chief Complaint  Patient presents with  . Medical Management of Chronic Issues    HPI:  This a long term resident of this facility being seen for the management of his chronic illnesses.  His weight in Dec 2015 was 107 pounds his current weight is 100 pounds. He is on supplements three times daily. He is unable to participate in the hpi or ros.  He is appropriate at this time for hospice care.     Past Medical History  Diagnosis Date  . Diabetes mellitus   . Orchitis and epididymitis in disease classified elsewhere   . Unspecified intestinal obstruction   . Unspecified osteomyelitis, site unspecified   . Encounter for long-term (current) use of other medications   . Hypertrophy of prostate without urinary obstruction and other lower urinary tract symptoms (LUTS)   . Depressive disorder, not elsewhere classified   . Edema of penis   . Pulmonary congestion and hypostasis   . Other ascites   . Thrombocytopenia   . Urinary tract infection, site not specified   . Chronic ulcer of other specified site   . Acute, but ill-defined, cerebrovascular disease   . Intestinal infection due to E. coli, unspecified   . Other convulsions   . Unspecified essential hypertension   . Urinary tract infection, site not specified   . Acute upper respiratory infections of unspecified site   . Other chronic pain   . Unspecified psychosis   . Unspecified constipation   . Acute kidney failure, unspecified   . HCAP (healthcare-associated pneumonia) 07/01/2012  . Subdural hematoma 04/04/2014    Past Surgical History  Procedure Laterality Date  . Below knee leg amputation      ABove the knee AKA bil  . Vp shunt placement  1990  .  Circumcision  2007  . Suprapubic catheter placement  2007    VITAL SIGNS BP 98/58 mmHg  Pulse 88  Ht 3\' 6"  (1.067 m)  Wt 100 lb (45.36 kg)  BMI 39.84 kg/m2  SpO2 95%   Outpatient Encounter Prescriptions as of 07/28/2014  Medication Sig  . escitalopram (LEXAPRO) 5 MG tablet Take 5 mg by mouth daily.  . hyoscyamine (LEVSIN SL) 0.125 MG SL tablet Place 0.125 mg under the tongue every 4 (four) hours as needed for cramping.  . levETIRAcetam (KEPPRA) 250 MG tablet Take 500 mg by mouth 2 (two) times daily.   Marland Kitchen LORazepam (ATIVAN) 1 MG tablet Take 1 tablet by mouth every 8 hours as need for seizures.  . methocarbamol (ROBAXIN) 500 MG tablet Take 2 tablets (1,000 mg total) by mouth 2 (two) times daily.  . Multiple Vitamin (MULTIVITAMIN) tablet Take 1 tablet by mouth daily.  Marland Kitchen oxyCODONE (OXY IR/ROXICODONE) 5 MG immediate release tablet Take 5 mg by mouth once a week. Prior to wound doctor visit  . Propylene Glycol (SYSTANE BALANCE OP) Apply 1 drop to eye every 12 (twelve) hours. To both eyes  . sennosides-docusate sodium (SENOKOT-S) 8.6-50 MG tablet Take 2 tablets by mouth at bedtime as needed for constipation.     SIGNIFICANT DIAGNOSTIC EXAMS   LABS REVIEWED:   02-26-14: wbc 6.9;  hgb 12.5; hct 38.7 ;mcv 89.3; plt 196; glucose 79; bun 18.4; creat 0.6; k+4.1; na++143; liver normal albumin 3.7; hgb a1c 5.3  06-29-14: wbc 6.0; hgb 11.6; hct 38.3; mcv 89.0; plt 225; glucose 104; bun 16.9; creat 0.56; k+4.6; na++140; liver normal albumin 3.6     Review of Systems  Unable to perform ROS    Physical Exam Constitutional: No distress.  Frail   Neck: Neck supple. No JVD present. No thyromegaly present.  Cardiovascular: Normal rate and regular rhythm.   Respiratory: Effort normal and breath sounds normal. No respiratory distress.  GI: Soft. Bowel sounds are normal. He exhibits no distension.  Genitourinary:  Has s/p foley  Musculoskeletal:  Bilateral aka; Left upper arm contracture     Neurological: He is alert.  Skin: Skin is warm and dry. He is not diaphoretic.  Stage IV sacral ulcer: large wound followed by wound MD.         ASSESSMENT/ PLAN:   1. Seizure: no reports of recent seizure activity present. Will continue keppra 500 mg twice daily; and ativan 1 mg every 8 hours as needed will monitor  2.  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 Chronic pain: no indications of pain present; will continue robaxin 1 gm twice daily for spasticity and will continue levsin 0.125 mg every 4 hours as needed for abdominal spasms  5. Depression: will continue lexapro 5 mg daily   6. FTT: his current weight is 100 pounds; will order a hospice consult for him to focus his care upon comfort only; will monitor his status.   7. Bilateral aka: no change    Synthia Innocent NP [facility]  Contact [phone] Monday through Friday 8am- 5pm  After hours call [phone]

## 2014-08-16 ENCOUNTER — Non-Acute Institutional Stay (SKILLED_NURSING_FACILITY): Payer: Medicare Other | Admitting: Internal Medicine

## 2014-08-16 ENCOUNTER — Encounter: Payer: Self-pay | Admitting: Internal Medicine

## 2014-08-16 DIAGNOSIS — G8929 Other chronic pain: Secondary | ICD-10-CM

## 2014-08-16 DIAGNOSIS — Z89612 Acquired absence of left leg above knee: Secondary | ICD-10-CM

## 2014-08-16 DIAGNOSIS — L89154 Pressure ulcer of sacral region, stage 4: Secondary | ICD-10-CM | POA: Diagnosis not present

## 2014-08-16 DIAGNOSIS — I679 Cerebrovascular disease, unspecified: Secondary | ICD-10-CM | POA: Diagnosis not present

## 2014-08-16 DIAGNOSIS — R339 Retention of urine, unspecified: Secondary | ICD-10-CM | POA: Diagnosis not present

## 2014-08-16 DIAGNOSIS — R627 Adult failure to thrive: Secondary | ICD-10-CM | POA: Diagnosis not present

## 2014-08-16 DIAGNOSIS — R569 Unspecified convulsions: Secondary | ICD-10-CM

## 2014-08-16 DIAGNOSIS — Z0000 Encounter for general adult medical examination without abnormal findings: Secondary | ICD-10-CM

## 2014-08-16 DIAGNOSIS — Z89611 Acquired absence of right leg above knee: Secondary | ICD-10-CM | POA: Diagnosis not present

## 2014-08-16 DIAGNOSIS — I10 Essential (primary) hypertension: Secondary | ICD-10-CM

## 2014-08-26 ENCOUNTER — Non-Acute Institutional Stay (SKILLED_NURSING_FACILITY): Payer: Medicare Other | Admitting: Adult Health

## 2014-08-26 DIAGNOSIS — R569 Unspecified convulsions: Secondary | ICD-10-CM

## 2014-08-26 DIAGNOSIS — L89154 Pressure ulcer of sacral region, stage 4: Secondary | ICD-10-CM

## 2014-08-26 DIAGNOSIS — R627 Adult failure to thrive: Secondary | ICD-10-CM | POA: Diagnosis not present

## 2014-08-26 DIAGNOSIS — I639 Cerebral infarction, unspecified: Secondary | ICD-10-CM

## 2014-09-06 NOTE — Progress Notes (Signed)
Patient ID: [name], male   DOB: 02/22/1946, 69 y.o.   MRN: [id number]    [facility]    Place of Service: SNF (31) OFFICE   Allergies  Allergen Reactions  . Heparin Other (See Comments)    Hx of HIT in 2007    Chief Complaint  Patient presents with  . Annual Exam    HPI:  69 yo male seen today for comprehensive exam. He is nonverbal and unable to provide hx. No nursing issues.  Seizure d/o controlled on keppra and prn lorazepam.  Chronic pain is stable on robaxin, oxycodone, air mattress bed  Depression is controlled on lexapro.  He has a chronic suprapubic cath due to urinary retention.    He has failure to thrive and currently on mechanical soft diet. He receives nutritional supplemt QID.  He has a stage 4 sacrla ulcer and is followed by wound care.  CODE STATUS: DNR/DNI  Past Medical History  Diagnosis Date  . Diabetes mellitus   . Orchitis and epididymitis in disease classified elsewhere   . Unspecified intestinal obstruction   . Unspecified osteomyelitis, site unspecified   . Encounter for long-term (current) use of other medications   . Hypertrophy of prostate without urinary obstruction and other lower urinary tract symptoms (LUTS)   . Depressive disorder, not elsewhere classified   . Edema of penis   . Pulmonary congestion and hypostasis   . Other ascites   . Thrombocytopenia   . Urinary tract infection, site not specified   . Chronic ulcer of other specified site   . Acute, but ill-defined, cerebrovascular disease   . Intestinal infection due to E. coli, unspecified   . Other convulsions   . Unspecified essential hypertension   . Urinary tract infection, site not specified   . Acute upper respiratory infections of unspecified site   . Other chronic pain   . Unspecified psychosis   . Unspecified constipation   . Acute kidney failure, unspecified   . HCAP (healthcare-associated pneumonia) 07/01/2012  .  Subdural hematoma 04/04/2014   Past Surgical History  Procedure Laterality Date  . Below knee leg amputation      ABove the knee AKA bil  . Vp shunt placement  1990  . Circumcision  2007  . Suprapubic catheter placement  2007   No family history on file. History   Social History  . Marital Status: Single    Spouse Name: N/A  . Number of Children: N/A  . Years of Education: N/A   Social History Main Topics  . Smoking status: Former Smoker    Types: Cigarettes  . Smokeless tobacco: Never Used  . Alcohol Use: No     Comment: quit alcohol in 1999  . Drug Use: No     Comment: quit drugs in 1999  . Sexual Activity: No   Other Topics Concern  . None   Social History Narrative   Past medical, surgical, family and social history reviewed   Medications: Patient's Medications  New Prescriptions   No medications on file  Previous Medications   ESCITALOPRAM (LEXAPRO) 5 MG TABLET    Take 5 mg by mouth daily.   HYOSCYAMINE (LEVSIN SL) 0.125 MG SL TABLET    Place 0.125 mg under the tongue every 4 (four) hours as needed for cramping.   LEVETIRACETAM (KEPPRA) 250 MG TABLET    Take 500 mg by mouth 2 (two) times daily.    LORAZEPAM (ATIVAN) 1 MG  TABLET    Take 1 tablet by mouth every 8 hours as need for seizures.   METHOCARBAMOL (ROBAXIN) 500 MG TABLET    Take 2 tablets (1,000 mg total) by mouth 2 (two) times daily.   MULTIPLE VITAMIN (MULTIVITAMIN) TABLET    Take 1 tablet by mouth daily.   OXYCODONE (OXY IR/ROXICODONE) 5 MG IMMEDIATE RELEASE TABLET    Take 5 mg by mouth once a week. Prior to wound doctor visit   PROPYLENE GLYCOL (SYSTANE BALANCE OP)    Apply 1 drop to eye every 12 (twelve) hours. To both eyes   SENNOSIDES-DOCUSATE SODIUM (SENOKOT-S) 8.6-50 MG TABLET    Take 2 tablets by mouth at bedtime as needed for constipation.  Modified Medications   No medications on file  Discontinued Medications   No medications on file     Review of Systems  Unable to perform ROS:  Patient nonverbal    Filed Vitals:   08/16/14 1640  BP: 102/56  Pulse: 88  Temp: 98.1 F (36.7 C)  SpO2: 96%   There is no weight on file to calculate BMI.  Physical Exam  Constitutional:  Frail appearing, cachexic in NAD. Awake and alert  HENT:  Head: Atraumatic.  Mouth/Throat: Oropharynx is clear and moist. No oropharyngeal exudate.  Temporal wasting  Eyes: Pupils are equal, round, and reactive to light. No scleral icterus.  Neck: Neck supple. No tracheal deviation present. No thyromegaly present.  Cardiovascular: Normal rate, regular rhythm, normal heart sounds and intact distal pulses.  Exam reveals no gallop and no friction rub.   No murmur heard. No carotid bruit b/l; no distal LE swelling  Pulmonary/Chest: Effort normal and breath sounds normal. No stridor. He has no wheezes. He has no rales. He exhibits no tenderness.  Abdominal: Soft. Bowel sounds are normal. He exhibits no distension, no abdominal bruit, no pulsatile midline mass and no mass. There is no hepatosplenomegaly. There is no tenderness. There is no rebound and no guarding.  Suprapubic cath intact and DTG with minimum urine noted. No cloudy sediment  Musculoskeletal:  B/l AKA with no signs of infection of stump. B/l contracture of hand- extended at wrist  Lymphadenopathy:    He has no cervical adenopathy.  Neurological: He is alert. He has normal reflexes. He displays atrophy. He displays no tremor. He displays no seizure activity.  Skin: Skin is warm and dry. No rash noted.  Psychiatric: He has a normal mood and affect. His behavior is normal. Judgment and thought content normal.     Labs reviewed: CBG 111    Assessment/Plan   ICD-9-CM ICD-10-CM   1. Encounter for annual health examination V70.0 Z00.00   2. Adult failure to thrive - unchanged 783.7 R62.7   3. Chronic pain - controlled 338.29 G89.29   4. Essential hypertension, benign - controlled 401.1 I10   5. S/P AKA (above knee amputation)  bilateral V49.76 Z89.611     Z89.612   6. Cerebrovascular disease with hx CVA 437.9 I67.9   7. Urinary retention with incomplete bladder emptying s/p suprapubic cath 788.21 R33.9   8. Seizures - uncontrolled 780.39 R56.9   9. Stage IV pressure ulcer of sacral region - unchanged 707.03 L89.154    707.24     --Pt is stable on current tx plan. Continue current medications as ordered. Check CBC w diff, CMP, TSH in March 2016. Will follow  Franchelle Foskett S. [facility] [phone]  [address] [place], [place] [address] [phone] Office (Wednesdays and Fridays 8 AM - 5 PM) [phone] Cell (Monday-Friday 8 AM - 5 PM)

## 2014-09-13 ENCOUNTER — Other Ambulatory Visit: Payer: Self-pay | Admitting: *Deleted

## 2014-09-13 MED ORDER — MORPHINE SULFATE (CONCENTRATE) 10 MG /0.5 ML PO SOLN: ORAL | Status: DC

## 2014-09-13 MED ORDER — MORPHINE SULFATE (CONCENTRATE) 10 MG /0.5 ML PO SOLN: ORAL | Status: AC

## 2014-09-13 NOTE — Telephone Encounter (Signed)
[facility] 

## 2014-09-25 ENCOUNTER — Encounter: Payer: Self-pay | Admitting: Internal Medicine

## 2014-10-01 DEATH — deceased

## 2014-10-05 NOTE — Progress Notes (Signed)
Patient ID: [name], male   DOB: 03/24/1946, 69 y.o.   MRN: [id number]  [facility]     Allergies  Allergen Reactions  . Heparin Other (See Comments)    Hx of HIT in 2007       Chief Complaint  Patient presents with  . Medical Management of Chronic Issues    HPI:  He is a long term resident of this facility being seen for the management of his chronic illnesses. Overall he continues to decline; he has a hospice consult pending.  He is unable to participate in the hpi or ros. There are no nursing concerns at this time.   Past Medical History  Diagnosis Date  . Diabetes mellitus   . Orchitis and epididymitis in disease classified elsewhere   . Unspecified intestinal obstruction   . Unspecified osteomyelitis, site unspecified   . Encounter for long-term (current) use of other medications   . Hypertrophy of prostate without urinary obstruction and other lower urinary tract symptoms (LUTS)   . Depressive disorder, not elsewhere classified   . Edema of penis   . Pulmonary congestion and hypostasis   . Other ascites   . Thrombocytopenia   . Urinary tract infection, site not specified   . Chronic ulcer of other specified site   . Acute, but ill-defined, cerebrovascular disease   . Intestinal infection due to E. coli, unspecified   . Other convulsions   . Unspecified essential hypertension   . Urinary tract infection, site not specified   . Acute upper respiratory infections of unspecified site   . Other chronic pain   . Unspecified psychosis   . Unspecified constipation   . Acute kidney failure, unspecified   . HCAP (healthcare-associated pneumonia) 07/01/2012  . Subdural hematoma 04/04/2014    Past Surgical History  Procedure Laterality Date  . Below knee leg amputation      ABove the knee AKA bil  . Vp shunt placement  1990  . Circumcision  2007  . Suprapubic catheter placement  2007    VITAL SIGNS BP 109/56 mmHg  Pulse 95  Ht 3\' 6"  (1.067 m)  Wt 101 lb  (45.813 kg)  BMI 40.24 kg/m2  SpO2 94%   Outpatient Encounter Prescriptions as of 08/26/2014  Medication Sig  . escitalopram (LEXAPRO) 5 MG tablet Take 5 mg by mouth daily.  . hyoscyamine (LEVSIN SL) 0.125 MG SL tablet Place 0.125 mg under the tongue every 4 (four) hours as needed for cramping.  . levETIRAcetam (KEPPRA) 250 MG tablet Take 500 mg by mouth 2 (two) times daily.   Marland Kitchen. LORazepam (ATIVAN) 1 MG tablet Take 1 tablet by mouth every 8 hours as need for seizures.  . methocarbamol (ROBAXIN) 500 MG tablet Take 2 tablets (1,000 mg total) by mouth 2 (two) times daily.  . Multiple Vitamin (MULTIVITAMIN) tablet Take 1 tablet by mouth daily.  Marland Kitchen. oxyCODONE (OXY IR/ROXICODONE) 5 MG immediate release tablet Take 5 mg by mouth once a week. Prior to wound doctor visit  . Propylene Glycol (SYSTANE BALANCE OP) Apply 1 drop to eye every 12 (twelve) hours. To both eyes  . sennosides-docusate sodium (SENOKOT-S) 8.6-50 MG tablet Take 2 tablets by mouth at bedtime as needed for constipation.     SIGNIFICANT DIAGNOSTIC EXAMS  LABS REVIEWED:   02-26-14: wbc 6.9; hgb 12.5; hct 38.7 ;mcv 89.3; plt 196; glucose 79; bun 18.4; creat 0.6; k+4.1; na++143; liver normal albumin 3.7; hgb a1c 5.3  06-29-14: wbc 6.0; hgb 11.6;  hct 38.3; mcv 89.0; plt 225; glucose 104; bun 16.9; creat 0.56; k+4.6; na++140; liver normal albumin 3.6      ROS Unable to perform ROS   Physical Exam Constitutional: No distress.  Frail   Neck: Neck supple. No JVD present. No thyromegaly present.  Cardiovascular: Normal rate and regular rhythm.   Respiratory: Effort normal and breath sounds normal. No respiratory distress.  GI: Soft. Bowel sounds are normal. He exhibits no distension.  Genitourinary:  Has s/p foley  Musculoskeletal:  Bilateral aka; Left upper arm contracture   Neurological: He is alert.  Skin: Skin is warm and dry. He is not diaphoretic.  Stage IV sacral ulcer: large wound followed by wound MD.      ASSESSMENT/ PLAN:   1. Seizure: no reports of recent seizure activity present. Will continue keppra 500 mg twice daily; and ativan 1 mg every 8 hours as needed will monitor  2.  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 Chronic pain: no indications of pain present; will continue robaxin 1 gm twice daily for spasticity and will continue levsin 0.125 mg every 4 hours as needed for abdominal spasms  5. Depression: will continue lexapro 5 mg daily   6. FTT: his current weight is 101 pounds; hospice consult is pending.    Synthia Innocent NP [facility]  Contact [phone] Monday through Friday 8am- 5pm  After hours call [phone]
